# Patient Record
Sex: Female | Born: 1985 | Race: Black or African American | Hispanic: No | Smoking: Former smoker
Health system: Southern US, Community
[De-identification: ages and names within clinical notes are randomized; demographics above are authoritative.]

## PROBLEM LIST (undated history)

## (undated) ENCOUNTER — Inpatient Hospital Stay (HOSPITAL_COMMUNITY): Payer: Self-pay

## (undated) DIAGNOSIS — N926 Irregular menstruation, unspecified: Secondary | ICD-10-CM

## (undated) DIAGNOSIS — Z8619 Personal history of other infectious and parasitic diseases: Secondary | ICD-10-CM

## (undated) DIAGNOSIS — A5901 Trichomonal vulvovaginitis: Secondary | ICD-10-CM

## (undated) DIAGNOSIS — S61011A Laceration without foreign body of right thumb without damage to nail, initial encounter: Secondary | ICD-10-CM

## (undated) DIAGNOSIS — A549 Gonococcal infection, unspecified: Secondary | ICD-10-CM

## (undated) DIAGNOSIS — K219 Gastro-esophageal reflux disease without esophagitis: Secondary | ICD-10-CM

## (undated) DIAGNOSIS — D649 Anemia, unspecified: Secondary | ICD-10-CM

## (undated) HISTORY — PX: NO PAST SURGERIES: SHX2092

## (undated) HISTORY — PX: WISDOM TOOTH EXTRACTION: SHX21

## (undated) HISTORY — DX: Personal history of other infectious and parasitic diseases: Z86.19

## (undated) HISTORY — DX: Anemia, unspecified: D64.9

---
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
## 2007-11-05 ENCOUNTER — Emergency Department (HOSPITAL_COMMUNITY): Admission: EM | Admit: 2007-11-05 | Discharge: 2007-11-05 | Payer: Self-pay | Admitting: Family Medicine

## 2008-01-26 ENCOUNTER — Emergency Department (HOSPITAL_COMMUNITY): Admission: EM | Admit: 2008-01-26 | Discharge: 2008-01-26 | Payer: Self-pay | Admitting: Family Medicine

## 2008-04-03 ENCOUNTER — Emergency Department (HOSPITAL_COMMUNITY): Admission: EM | Admit: 2008-04-03 | Discharge: 2008-04-03 | Payer: Self-pay | Admitting: Emergency Medicine

## 2008-04-20 ENCOUNTER — Emergency Department (HOSPITAL_COMMUNITY): Admission: EM | Admit: 2008-04-20 | Discharge: 2008-04-20 | Payer: Self-pay | Admitting: Family Medicine

## 2008-10-22 ENCOUNTER — Emergency Department (HOSPITAL_COMMUNITY): Admission: EM | Admit: 2008-10-22 | Discharge: 2008-10-22 | Payer: Self-pay | Admitting: Emergency Medicine

## 2009-07-25 ENCOUNTER — Emergency Department (HOSPITAL_COMMUNITY): Admission: EM | Admit: 2009-07-25 | Discharge: 2009-07-26 | Payer: Self-pay | Admitting: Emergency Medicine

## 2009-08-01 ENCOUNTER — Emergency Department (HOSPITAL_COMMUNITY): Admission: EM | Admit: 2009-08-01 | Discharge: 2009-08-01 | Payer: Self-pay | Admitting: Emergency Medicine

## 2009-09-17 ENCOUNTER — Ambulatory Visit: Payer: Self-pay | Admitting: Physician Assistant

## 2009-09-17 DIAGNOSIS — R079 Chest pain, unspecified: Secondary | ICD-10-CM | POA: Insufficient documentation

## 2009-09-17 DIAGNOSIS — J309 Allergic rhinitis, unspecified: Secondary | ICD-10-CM | POA: Insufficient documentation

## 2009-09-17 DIAGNOSIS — K219 Gastro-esophageal reflux disease without esophagitis: Secondary | ICD-10-CM | POA: Insufficient documentation

## 2009-09-18 ENCOUNTER — Encounter: Payer: Self-pay | Admitting: Physician Assistant

## 2009-11-05 ENCOUNTER — Ambulatory Visit: Payer: Self-pay | Admitting: Nurse Practitioner

## 2009-11-05 DIAGNOSIS — F17200 Nicotine dependence, unspecified, uncomplicated: Secondary | ICD-10-CM | POA: Insufficient documentation

## 2009-11-05 DIAGNOSIS — S93409A Sprain of unspecified ligament of unspecified ankle, initial encounter: Secondary | ICD-10-CM | POA: Insufficient documentation

## 2009-11-05 DIAGNOSIS — M25519 Pain in unspecified shoulder: Secondary | ICD-10-CM | POA: Insufficient documentation

## 2009-12-18 ENCOUNTER — Ambulatory Visit: Payer: Self-pay | Admitting: Nurse Practitioner

## 2009-12-19 ENCOUNTER — Ambulatory Visit: Payer: Self-pay | Admitting: Nurse Practitioner

## 2010-02-09 LAB — CONVERTED CEMR LAB
ALT: 8 units/L (ref 0–35)
Alkaline Phosphatase: 49 units/L (ref 39–117)
Basophils Relative: 0 % (ref 0–1)
Calcium: 9 mg/dL (ref 8.4–10.5)
Eosinophils Relative: 7 % — ABNORMAL HIGH (ref 0–5)
Glucose, Bld: 80 mg/dL (ref 70–99)
Lymphocytes Relative: 37 % (ref 12–46)
Lymphs Abs: 3.5 10*3/uL (ref 0.7–4.0)
MCV: 88.6 fL (ref 78.0–100.0)
Monocytes Absolute: 0.5 10*3/uL (ref 0.1–1.0)
Monocytes Relative: 5 % (ref 3–12)
Neutrophils Relative %: 50 % (ref 43–77)
Potassium: 4.3 meq/L (ref 3.5–5.3)
RDW: 14.7 % (ref 11.5–15.5)
TSH: 0.981 microintl units/mL (ref 0.350–4.500)
Total Bilirubin: 0.3 mg/dL (ref 0.3–1.2)

## 2010-02-11 NOTE — Assessment & Plan Note (Signed)
Summary: chest pain/ feeling weak//gk   Vital Signs:  Patient profile:   25 year old female Height:      61 inches Weight:      171 pounds BMI:     32.43 Temp:     98.7 degrees F oral Pulse rate:   78 / minute Pulse rhythm:   regular Resp:     18 per minute BP sitting:   120 / 82  (left arm) Cuff size:   regular  Vitals Entered By: [place] Shannon (September 17, 2009 12:20 PM) CC: [name] [name] is here for chest pains.... [name] [name] would like to know if she can have a med for allergies... Is Patient Diabetic? No Pain Assessment Patient in pain? no       Does patient need assistance? Functional Status Self care Ambulation Normal   Primary Care Provider:  Tereso Newcomer, PA-C  CC:  [name] [name] is here for chest pains.... [name] [name] would like to know if she can have a med for allergies....  History of Present Illness: Re-estab.  Used to see Dr. Barbaraann Barthel. Has had chest pain for about 1 month.  Notes with meals after eating.  Not taking anything.  No exertional chest pain or dyspnea.  No cough or wheezing.  No syncope.  CHest hurst to lay down.  ? h/o PND.  Denies dysphagia.  No melena, hematochezia or hematemesis.  NO dysphagia.  Has problem with allergies.  Notes a lot of nasal congestion, sneezing watery eyes.  Currently not taking anything.  Habits & Providers  Alcohol-Tobacco-Diet     Tobacco Status: current  Exercise-Depression-Behavior     Drug Use: no  Allergies (verified): 1)  ! Sulfa  Past History:  Past Medical History: Unremarkable  Past Surgical History: Denies surgical history  Family History: MGF - CAD (MI in 4s) HTN- Mom Family History Diabetes 1st degree relative - mom  Social History: Current Smoker   a.  1 pk a week Alcohol use-no Drug use-no Single Smoking Status:  current Drug Use:  no  Review of Systems      See HPI GU:  Denies dysuria and hematuria.  Physical Exam  General:  alert, well-developed, and well-nourished.   Head:  normocephalic and atraumatic.     Eyes:  pupils equal, pupils round, and pupils reactive to light.   Ears:  R ear normal and L ear normal.   Nose:  no external deformity.   Mouth:  pharynx pink and moist.   Neck:  supple and no thyromegaly.   Lungs:  normal breath sounds, no crackles, and no wheezes.   Heart:  normal rate and regular rhythm.   Abdomen:  soft, normal bowel sounds, no hepatomegaly, and epigastric tenderness.   Neurologic:  alert & oriented X3 and cranial nerves II-XII intact.   Psych:  normally interactive.     Impression & Recommendations:  Problem # 1:  CHEST PAIN UNSPECIFIED (ICD-786.50) prob from GERD reassurance EKG normal  Orders: EKG w/ Interpretation (93000) T-CBC w/Diff (16109-60454) T-Comprehensive Metabolic Panel (09811-91478) T-TSH (29562-13086)  Problem # 2:  GERD (ICD-530.81)  start zantac check labs f/u in 6 weeks  Her updated medication list for this problem includes:    Ranitidine Hcl 150 Mg Tabs (Ranitidine hcl) .Marland Kitchen... Take 1 tablet by mouth two times a day  Orders: T-CBC w/Diff (57846-96295)  Problem # 3:  ALLERGIC RHINITIS (ICD-477.9)  try flonase and zyrtec  Her updated medication list for this problem includes:    Flonase 50 Mcg/act  Susp (Fluticasone propionate) .Marland Kitchen... 2 sprays each nostril once daily    Zyrtec Allergy 10 Mg Tabs (Cetirizine hcl) .Marland Kitchen... Take 1 tablet by mouth once a day  Complete Medication List: 1)  Ranitidine Hcl 150 Mg Tabs (Ranitidine hcl) .... Take 1 tablet by mouth two times a day 2)  Flonase 50 Mcg/act Susp (Fluticasone propionate) .... 2 sprays each nostril once daily 3)  Zyrtec Allergy 10 Mg Tabs (Cetirizine hcl) .... Take 1 tablet by mouth once a day  Patient Instructions: 1)  Use Afrin two times a day for 3 days then stop. 2)  Start using FLonase when you get it.  Do not use at same time as Afrin. 3)  Take zyrtec once daily. 4)  Take zantac two times a day every day. 5)  Schedule follow up with Ronon Ferger in 6 weeks. 6)  Avoid foods  high in acid(tomatoes, citrus juices,spicy foods).Avoid eating within two hours of lying down or before exercising. Do not over eat: try smaller more frequent meals. Elevate head of bed twelve inches when sleeping.  7)  Tobacco is very bad for your health and your loved ones ! You should stop smoking !  8)  Stop smoking tips: Choose a quit date. Cut down before the quit date. Decide what you will do as a substitute when you feel the urge to smoke(gum, toothpick, exercise).  9)  Smoking can make stomach acid worse. Prescriptions: ZYRTEC ALLERGY 10 MG TABS (CETIRIZINE HCL) Take 1 tablet by mouth once a day  #30 x 3   Entered and Authorized by:   Tereso Newcomer PA-C   Signed by:   Tereso Newcomer PA-C on 09/17/2009   Method used:   Print then Give to Patient   RxID:   [id number] FLONASE 50 MCG/ACT SUSP (FLUTICASONE PROPIONATE) 2 sprays each nostril once daily  #1 x 3   Entered and Authorized by:   Tereso Newcomer PA-C   Signed by:   Tereso Newcomer PA-C on 09/17/2009   Method used:   Print then Give to Patient   RxID:   [id number] RANITIDINE HCL 150 MG TABS (RANITIDINE HCL) Take 1 tablet by mouth two times a day  #60 x 3   Entered and Authorized by:   Tereso Newcomer PA-C   Signed by:   Tereso Newcomer PA-C on 09/17/2009   Method used:   Print then Give to Patient   RxID:   [id number]    EKG  Procedure date:  09/17/2009  Findings:      Normal sinus rhythm with rate of:  73 normal axis no isch changes

## 2010-02-11 NOTE — Letter (Signed)
Summary: Handout Printed  Printed Handout:  - Ankle Sprain 
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
## 2010-02-11 NOTE — Letter (Signed)
Summary: *HS[facility] Results Follow up  [facility]  [address] [place], [place] [address]   Phone: [phone]  Fax: [phone]      09/18/2009   [name] [name] [address] [place], [place]  [address]   Dear  [name],                            ____S.Drinkard,FNP   ____D. Gore,FNP       ____B. McPherson,MD   ____V. Rankins,MD    ____E. Mulberry,MD    ____N. Daphine Deutscher, FNP  ____D. Reche Dixon, MD    ____K. Philipp Deputy, MD    __x__S. Alben Spittle, PA-C     This letter is to inform you that your recent test(s):  _______Pap Smear    ___x____Lab Test     _______X-ray    ___x____ is within acceptable limits  _______ requires a medication change  _______ requires a follow-up lab visit  _______ requires a follow-up visit with your provider   Comments: Blood counts, thyroid, kidney and liver function all normal.       _________________________________________________________ If you have any questions, please contact our office                     Sincerely,  [name] Newcomer PA-C [facility]

## 2010-02-14 ENCOUNTER — Other Ambulatory Visit (HOSPITAL_COMMUNITY)
Admission: RE | Admit: 2010-02-14 | Discharge: 2010-02-14 | Disposition: A | Discharge status: Home / Self Care | Payer: Self-pay | Source: Ambulatory Visit | Attending: Nurse Practitioner | Admitting: Nurse Practitioner

## 2010-02-14 ENCOUNTER — Other Ambulatory Visit: Payer: Self-pay | Admitting: Nurse Practitioner

## 2010-02-14 ENCOUNTER — Encounter: Payer: Self-pay | Admitting: Nurse Practitioner

## 2010-02-14 ENCOUNTER — Encounter (INDEPENDENT_AMBULATORY_CARE_PROVIDER_SITE_OTHER): Payer: Self-pay | Admitting: Nurse Practitioner

## 2010-02-14 DIAGNOSIS — Z01419 Encounter for gynecological examination (general) (routine) without abnormal findings: Secondary | ICD-10-CM | POA: Insufficient documentation

## 2010-02-14 LAB — CONVERTED CEMR LAB
Bilirubin Urine: NEGATIVE
Glucose, Urine, Semiquant: NEGATIVE
Ketones, urine, test strip: NEGATIVE
Nitrite: NEGATIVE
Urobilinogen, UA: 0.2

## 2010-02-17 ENCOUNTER — Encounter (INDEPENDENT_AMBULATORY_CARE_PROVIDER_SITE_OTHER): Payer: Self-pay | Admitting: Nurse Practitioner

## 2010-02-17 DIAGNOSIS — A5400 Gonococcal infection of lower genitourinary tract, unspecified: Secondary | ICD-10-CM | POA: Insufficient documentation

## 2010-02-17 LAB — CONVERTED CEMR LAB
Basophils Absolute: 0.1 10*3/uL (ref 0.0–0.1)
Chlamydia, DNA Probe: NEGATIVE
GC Probe Amp, Genital: POSITIVE — AB
HCT: 40.6 % (ref 36.0–46.0)
Hemoglobin: 13.2 g/dL (ref 12.0–15.0)
Lymphocytes Relative: 45 % (ref 12–46)
Lymphs Abs: 3.8 10*3/uL (ref 0.7–4.0)
MCV: 89.8 fL (ref 78.0–100.0)
Monocytes Relative: 6 % (ref 3–12)
Neutro Abs: 3.7 10*3/uL (ref 1.7–7.7)
Neutrophils Relative %: 44 % (ref 43–77)
Platelets: 176 10*3/uL (ref 150–400)
RDW: 14.6 % (ref 11.5–15.5)

## 2010-02-18 ENCOUNTER — Telehealth (INDEPENDENT_AMBULATORY_CARE_PROVIDER_SITE_OTHER): Payer: Self-pay | Admitting: Nurse Practitioner

## 2010-02-18 ENCOUNTER — Encounter (INDEPENDENT_AMBULATORY_CARE_PROVIDER_SITE_OTHER): Payer: Self-pay | Admitting: Nurse Practitioner

## 2010-02-18 DIAGNOSIS — A599 Trichomoniasis, unspecified: Secondary | ICD-10-CM | POA: Insufficient documentation

## 2010-02-19 ENCOUNTER — Encounter (INDEPENDENT_AMBULATORY_CARE_PROVIDER_SITE_OTHER): Payer: Self-pay | Admitting: Nurse Practitioner

## 2010-02-19 NOTE — Assessment & Plan Note (Signed)
Summary: Complete Physical Exam   Vital Signs:  Patient profile:   25 year old female Menstrual status:  regular LMP:     02/02/2010 Weight:      163.6 pounds BMI:     31.02 Temp:     97.4 degrees F oral Pulse rate:   76 / minute Pulse rhythm:   regular Resp:     20 per minute BP sitting:   100 / 70  (left arm) Cuff size:   regular  Vitals Entered By: Levon Hedger (February 14, 2010 9:51 AM)  Nutrition Counseling: Patient's BMI is greater than 25 and therefore counseled on weight management options. CC: CPP Is Patient Diabetic? No Pain Assessment Patient in pain? no       Does patient need assistance? Functional Status Self care Ambulation Normal LMP (date): 02/02/2010 LMP - Character: light Menarche (age onset years): 15   Menses interval (days): 28 Menstrual flow (days): 5 Menstrual Status regular Enter LMP: 02/02/2010  Immunization History:  Hepatitis B Immunization History:    Hepatitis B # 1:  historical (11/27/2000)    Hepatitis B # 2:  historical (12/27/2000)    Hepatitis B # 3:  historical (04/18/2001)  DPT Immunization History:    DPT # 1:  historical (02/25/1988)    DPT # 2:  historical (05/27/1988)    DPT # 3:  historical (09/17/1988)    DPT # 4:  historical (09/15/1989)    DPT # 5:  historical (02/26/1992)  HIB Immunization History:    HIB # 1:  historical (01/29/1989)  Polio Immunization History:    Polio # 1:  historical (02/25/1988)    Polio # 2:  historical (05/27/1988)    Polio # 3:  historical (01/29/1989)    Polio # 4:  historical (02/26/1992)  MMR Immunization History:    MMR # 1:  historical (01/29/1989)    MMR # 2:  historical (02/26/1992)  Tetanus/Td Immunization History:    Tetanus/Td:  historical (07/05/2006)  Meningococcal Immunization History:    Meningococcal:  menactra(state) (07/05/2006)  Hepatitis A Immunization History:    Hepatitis A # 1:  historical (07/05/2006)  HPV History:    HPV # 1:  gardasil (state)  (07/05/2006)   Primary Care Provider:  Tereso Newcomer, PA-C  CC:  CPP.  History of Present Illness:  Pt into the office for a complete physical exam.  PAP - last done at [facility] in 2008. all normal results no children, never been pregnant Menses monthly and LMP 02/02/2010 Pt has a boyfriend with whom she lives.  Mammogram - never had mammogram. no self breast exams home. No family history of breast cancer  Optho - no current glasses or contacts.  Last eye exam was about 4 years ago and admits that she needs another screening exam  Dental - last dental exam was 09/2009. Cleaning done.  Family planning - she has been on birth control since age 32 Pt wants to restart DepoProvera. She has been on it before but was not able to get refills and f/u with her provider at that time so she did not get it don.  Habits & Providers  Alcohol-Tobacco-Diet     Alcohol drinks/day: 0     Tobacco Status: current     Tobacco Counseling: to quit use of tobacco products     Cigarette Packs/Day: 0.25  Exercise-Depression-Behavior     Does Patient Exercise: no     Drug Use: no  Comments: PHQ- 9 score =  13, spoke with pt and she feels responsible for the burdens of some of her family members, along with what goes on at school and her work functions.  Admits that she does feel overwhelmed at times  Allergies (verified): 1)  ! Sulfa  Social History: Does Patient Exercise:  no  Review of Systems General:  Complains of sleep disorder; denies fever; problems staying asleep at times.  wakes up several times during the night. Eyes:  Denies blurring. ENT:  Denies nasal congestion. CV:  Denies chest pain or discomfort. Resp:  Denies cough. GI:  Complains of indigestion; denies abdominal pain, nausea, and vomiting; admits that she has been eatling lots of spicy foods lately.. GU:  Denies discharge. MS:  Denies joint pain. Neuro:  Denies headaches. Psych:  Denies depression. Endo:  Denies excessive  thirst and excessive urination. Allergy:  Complains of seasonal allergies.  Physical Exam  General:  alert.   Head:  normocephalic.   Eyes:  pupils round.   Ears:  ear piercing(s) noted.  bil TM with bony landmarks present Nose:  no nasal discharge.   Mouth:  pharynx pink and moist and fair dentition.   Neck:  supple.   Chest Wall:  no mass.   Breasts:  no abnormal thickening.   Lungs:  normal breath sounds.   Heart:  normal rate and regular rhythm.   Abdomen:  normal bowel sounds.   Rectal:  defer Msk:  normal ROM.   Pulses:  R radial normal and L radial normal.   Extremities:  no edema Neurologic:  alert & oriented X3 and gait normal.   Skin:  color normal.   Psych:  Oriented X3.    Pelvic Exam  Vulva:      normal appearance.   Urethra and Bladder:      Urethra--normal.  Bladder--normal.   Vagina:      normal.   Cervix:      midposition.   Uterus:      smooth.   Adnexa:      nontender bilaterally.      Impression & Recommendations:  Problem # 1:  PREVENTIVE HEALTH CARE (ICD-V70.0) PHQ- 13 rec optho and dental exam Orders: UA Dipstick w/o Micro (manual) (91478) T-CBC w/Diff [phone]) T- GC Chlamydia (57846) KOH/ WET Mount [phone]) Pap Smear, Thin Prep ( Collection of) (M8413)  Problem # 2:  TOBACCO ABUSE (ICD-305.1) advised cessation  Problem # 3:  GERD (ICD-530.81) advised pt that she need to modify her diet which is causing symptoms. Her updated medication list for this problem includes:    Ranitidine Hcl 150 Mg Tabs (Ranitidine hcl) .Marland Kitchen... Take 1 tablet by mouth two times a day  Problem # 4:  NEED PROPHYLACTIC VACCINATION&INOCULATION FLU (ICD-V04.81) given today in office  Complete Medication List: 1)  Ranitidine Hcl 150 Mg Tabs (Ranitidine hcl) .... Take 1 tablet by mouth two times a day 2)  Flonase 50 Mcg/act Susp (Fluticasone propionate) .... 2 sprays each nostril once daily 3)  Zyrtec Allergy 10 Mg Tabs (Cetirizine hcl) .... Take 1 tablet  by mouth once a day 4)  Ibuprofen 800 Mg Tabs (Ibuprofen) .... One tablet by mouth two times a day for for inflammation  Other Orders: Flu Vaccine 53yrs + (24401) Admin 1st Vaccine (02725)  Patient Instructions: 1)  You will be informed of abnormal lab results. 2)  You have been given the flu vaccine today 3)  Keep up your efforts to quit smoking. 4)  YOU NEED 2 LAB  APPOINTMENTS ON NEXT WEEK. 5)  Schedule an appointment on next week for a lab visit - serum HCG.  You will need to get your prescription for depoprovera on that day. 6)  Schedule a nurse visit the VERY next day for depoprovera.  You must bring the medication into the office with you.   Orders Added: 1)  Est. Patient age 60-39 [69] 2)  UA Dipstick w/o Micro (manual) [81002] 3)  T-CBC w/Diff [16109-60454] 4)  T- GC Chlamydia [09811] 5)  KOH/ WET Mount [87210] 6)  Pap Smear, Thin Prep ( Collection of) [Q0091] 7)  Flu Vaccine 35yrs + [90658] 8)  Admin 1st Vaccine [90471]   Immunization History:  Hepatitis B Immunization History:    Hepatitis B # 1:  historical (11/27/2000)    Hepatitis B # 2:  historical (12/27/2000)    Hepatitis B # 3:  historical (04/18/2001)  DPT Immunization History:    DPT # 1:  historical (02/25/1988)    DPT # 2:  historical (05/27/1988)    DPT # 3:  historical (09/17/1988)    DPT # 4:  historical (09/15/1989)    DPT # 5:  historical (02/26/1992)  HIB Immunization History:    HIB # 1:  historical (01/29/1989)  Polio Immunization History:    Polio # 1:  historical (02/25/1988)    Polio # 2:  historical (05/27/1988)    Polio # 3:  historical (01/29/1989)    Polio # 4:  historical (02/26/1992)  MMR Immunization History:    MMR # 1:  historical (01/29/1989)    MMR # 2:  historical (02/26/1992)  Tetanus/Td Immunization History:    Tetanus/Td:  historical (07/05/2006)  Meningococcal Immunization History:    Meningococcal:  menactra(state) (07/05/2006)  Hepatitis A Immunization  History:    Hepatitis A # 1:  historical (07/05/2006)  HPV History:    HPV # 1:  gardasil (state) (07/05/2006)  Immunizations Administered:  Influenza Vaccine # 1:    Vaccine Type: Fluvax 3+    Site: right deltoid    Mfr: GlaxoSmithKline    Dose: 0.5 ml    Route: IM    Given by: Levon Hedger    Exp. Date: 07/12/2010    Lot #: [id number]    VIS given: 08/06/09 version given February 14, 2010.  Flu Vaccine Consent Questions:    Do you have a history of severe allergic reactions to this vaccine? no    Any prior history of allergic reactions to egg and/or gelatin? no    Do you have a sensitivity to the preservative Thimersol? no    Do you have a past history of Guillan-Barre Syndrome? no    Do you currently have an acute febrile illness? no    Have you ever had a severe reaction to latex? no    Vaccine information given and explained to patient? yes    Are you currently pregnant? no    ndc  [phone]  Immunization History:  Hepatitis B Immunization History:    Hepatitis B # 1:  Historical (11/27/2000)    Hepatitis B # 2:  Historical (12/27/2000)    Hepatitis B # 3:  Historical (04/18/2001)  DPT History:    DPT # 1:  Historical (02/25/1988)    DPT # 2:  Historical (05/27/1988)    DPT # 3:  Historical (09/17/1988)    DPT # 4:  Historical (09/15/1989)    DPT # 5:  Historical (02/26/1992)  HIB History:    HIB #  1:  Historical (01/29/1989)  Polio Immunization History:    Polio # 1:  Historical (02/25/1988)    Polio # 2:  Historical (05/27/1988)    Polio # 3:  Historical (01/29/1989)    Polio # 4:  Historical (02/26/1992)  MMR Immunization History:    MMR # 1:  Historical (01/29/1989)    MMR # 2:  Historical (02/26/1992)  Tetanus/Td Immunization History:    Tetanus/Td:  Historical (07/05/2006)  Meningococcal Immunization History:    Meningococcal:  Menactra(State) (07/05/2006)  Hepatitis A Immunization History:    Hepatitis A # 1:  Historical  (07/05/2006)  HPV History:    HPV # 1:  Gardasil (State) (07/05/2006)  Immunizations Administered:  Influenza Vaccine # 1:    Vaccine Type: Fluvax 3+    Site: right deltoid    Mfr: GlaxoSmithKline    Dose: 0.5 ml    Route: IM    Given by: Levon Hedger    Exp. Date: 07/12/2010    Lot #: [id number]    VIS given: 08/06/09 version given February 14, 2010.  Laboratory Results   Urine Tests  Date/Time Received: February 14, 2010 10:12 AM   Routine Urinalysis   Glucose: negative   (Normal Range: Negative) Bilirubin: negative   (Normal Range: Negative) Ketone: negative   (Normal Range: Negative) Spec. Gravity: >=1.030   (Normal Range: 1.003-1.035) Blood: negative   (Normal Range: Negative) pH: 5.5   (Normal Range: 5.0-8.0) Protein: negative   (Normal Range: Negative) Urobilinogen: 0.2   (Normal Range: 0-1) Nitrite: negative   (Normal Range: Negative) Leukocyte Esterace: negative   (Normal Range: Negative)    Date/Time Received: February 14, 2010   Wet Mount/KOH Source: vaginal WBC/hpf: 1-5 Bacteria/hpf: rare Clue cells/hpf: none Yeast/hpf: none Trichomonas/hpf: none    Prevention & Chronic Care Immunizations   Influenza vaccine: Fluvax 3+  (02/14/2010)   Influenza vaccine deferral: Refused  (11/05/2009)    Tetanus booster: 07/05/2006: Historical    Pneumococcal vaccine: Not documented  Other Screening   Pap smear: Not documented   Smoking status: current  (02/14/2010)   Smoking cessation counseling: YES  (09/17/2009)   Nursing Instructions: Give Flu vaccine today    Laboratory Results   Urine Tests    Routine Urinalysis   Glucose: negative   (Normal Range: Negative) Bilirubin: negative   (Normal Range: Negative) Ketone: negative   (Normal Range: Negative) Spec. Gravity: >=1.030   (Normal Range: 1.003-1.035) Blood: negative   (Normal Range: Negative) pH: 5.5   (Normal Range: 5.0-8.0) Protein: negative   (Normal Range:  Negative) Urobilinogen: 0.2   (Normal Range: 0-1) Nitrite: negative   (Normal Range: Negative) Leukocyte Esterace: negative   (Normal Range: Negative)      Wet Mount Wet Mount KOH: Negative

## 2010-02-19 NOTE — Progress Notes (Signed)
Summary: Office Visit//DEPRESSION SCREENING  Office Visit//DEPRESSION SCREENING   Imported By: Arta Bruce 02/14/2010 15:02:53  _____________________________________________________________________  External Attachment:    Type:   Image     Comment:   External Document

## 2010-02-27 NOTE — Assessment & Plan Note (Signed)
Summary: Serum HCG  Nurse Visit   Allergies: 1)  ! Sulfa  Orders Added: 1)  Est. Patient Level I [16109] 2)  T-Pregnancy (Serum), Qual.  [phone] Prescriptions: DEPO-PROVERA 150 MG/ML SUSP (MEDROXYPROGESTERONE ACETATE) One injection every 12 weeks for birth control  #1 x 3   Entered by:   Levon Hedger   Authorized by:   Lehman Prom FNP   Signed by:   Levon Hedger on 02/17/2010   Method used:   Electronically to        [facility] [phone]* (retail)       [address]       [place], [place]  [address]       Ph: [phone]       Fax: [phone]   RxID:   [id number]   Appended Document: Lab Order    Lab Visit  Orders Today: Admin of Therapeutic Inj  intramuscular or subcutaneous [03474] Rocephin  250mg  [J0696]      Medication Administration  Injection # 1:    Medication: Rocephin  250mg     Diagnosis: GONORRHEA (ICD-098.0)    Route: IM    Site: LUOQ gluteus    Exp Date: 03/2011    Lot #: [id number]    Mfr: Sandoz    Comments: OVF  [id number]    Patient tolerated injection without complications    Given by: Levon Hedger (February 17, 2010 4:32 PM)  Orders Added: 1)  Admin of Therapeutic Inj  intramuscular or subcutaneous [96372] 2)  Rocephin  250mg  [C1660]

## 2010-02-27 NOTE — Progress Notes (Signed)
Summary: Needs further treatment  Phone Note Other Incoming   Summary of Call: From Brandy at [facility] -- treatment for gonorrhea has changed.  Treatment includes Rocephin 250 mg. IM plus azithromycin 1 gm orally x1 dose.  If we don't have the azithromycin, we can use doxycycline 100 mg. by mouth two times a day x 7 days.  Pt. needs to have further treatment.  Please call [facility] when additional treatment has been completed - [phone]. Initial call taken by: Dutch Quint RN,  February 18, 2010 2:26 PM  Follow-up for Phone Call        notify pt of above. She has received the injection but she will also need the oral meds  Once you contact the pt and send Rx to the pharmacy then call [facility] and given them an update Pt also has trichomonas.  She will need to take metronidazole 500gm - 4 pills x 1 dose. She should take the azithromycin one day then the next day take the metronidazole.  she has two different STD's please stress with pt the importance of condom use Follow-up by: Lehman Prom FNP,  February 19, 2010 8:56 AM  Additional Follow-up for Phone Call Additional follow up Details #1::        Levon Hedger  February 19, 2010 1:11 PM Left message on machine for pt to return call to the office.  pt informed of above information.  Rx faxed to [facility] [address] [facility]ienenstock is aware of above information as well. 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       [facility] [address]  [phone]* (retail)       [address]       [place], [place]  [address]       Ph: [phone]       Fax: [phone]   RxID:   [id number] AZITHROMYCIN 500 MG TABS (AZITHROMYCIN) 2 tablets by mouth x 1 dose  #2 x 0   Entered and Authorized by:   Lehman Prom FNP   Signed by:   Lehman Prom FNP on 02/19/2010   Method used:   Printed then faxed to ...       [facility] [address] [phone]* (retail)       [address]       [place], [place]  [address]       Ph: [phone]       Fax: [phone]   RxID:   [id number]

## 2010-02-27 NOTE — Letter (Signed)
Summary: COMMUNICABLE DISEASE REPORT  COMMUNICABLE DISEASE REPORT   Imported By: Arta Bruce 02/17/2010 16:33:09  _____________________________________________________________________  External Attachment:    Type:   Image     Comment:   External Document

## 2010-02-27 NOTE — Assessment & Plan Note (Signed)
Summary: Depo  Nurse Visit   Vitals Entered By: Hale Drone CMA (February 18, 2010 9:19 AM)  Allergies: 1)  ! Sulfa  Medication Administration  Injection # 1:    Medication: Depo-Provera 150mg     Diagnosis: CONTRACEPTIVE MANAGEMENT (ICD-V25.09)    Route: IM    Site: RUOQ gluteus    Exp Date: 12/11/2012    Lot #: [id number]    Mfr: Francisca December    Comments: NDC: [address]-5    Patient tolerated injection without complications    Given by: Hale Drone CMA (February 18, 2010 9:36 AM)  Orders Added: 1)  Est. Patient Nurse visit [09003] 2)  Admin of Therapeutic Inj  intramuscular or subcutaneous [96372] 3)  Depo-Provera 150mg  [J1055]

## 2010-03-11 ENCOUNTER — Inpatient Hospital Stay (INDEPENDENT_AMBULATORY_CARE_PROVIDER_SITE_OTHER)
Admission: RE | Admit: 2010-03-11 | Discharge: 2010-03-11 | Disposition: A | Discharge status: Home / Self Care | Payer: Self-pay | Source: Ambulatory Visit | Attending: Emergency Medicine | Admitting: Emergency Medicine

## 2010-03-11 DIAGNOSIS — B373 Candidiasis of vulva and vagina: Secondary | ICD-10-CM

## 2010-03-11 DIAGNOSIS — B3731 Acute candidiasis of vulva and vagina: Secondary | ICD-10-CM

## 2010-03-11 LAB — WET PREP, GENITAL

## 2010-03-13 LAB — GC/CHLAMYDIA PROBE AMP, GENITAL
Chlamydia, DNA Probe: NEGATIVE
GC Probe Amp, Genital: NEGATIVE

## 2010-03-26 ENCOUNTER — Telehealth (INDEPENDENT_AMBULATORY_CARE_PROVIDER_SITE_OTHER): Payer: Self-pay | Admitting: Nurse Practitioner

## 2010-03-29 LAB — POCT URINALYSIS DIP (DEVICE)
Glucose, UA: NEGATIVE mg/dL
Hgb urine dipstick: NEGATIVE
Ketones, ur: NEGATIVE mg/dL
pH: 5.5 (ref 5.0–8.0)

## 2010-03-29 LAB — WET PREP, GENITAL
Clue Cells Wet Prep HPF POC: NONE SEEN
Trich, Wet Prep: NONE SEEN
WBC, Wet Prep HPF POC: NONE SEEN
Yeast Wet Prep HPF POC: NONE SEEN

## 2010-03-29 LAB — GC/CHLAMYDIA PROBE AMP, GENITAL
Chlamydia, DNA Probe: NEGATIVE
Chlamydia, DNA Probe: NEGATIVE
GC Probe Amp, Genital: NEGATIVE
GC Probe Amp, Genital: NEGATIVE

## 2010-03-29 LAB — POCT PREGNANCY, URINE: Preg Test, Ur: NEGATIVE

## 2010-03-29 LAB — URINE CULTURE: Colony Count: NO GROWTH

## 2010-04-01 NOTE — Progress Notes (Signed)
Summary: Vag bleeding  Phone Note Call from Patient   Caller: Patient Summary of Call: Started Depo in Feb. has been having some spotting regularly for the past month. Assured her with Depo may have some spotting concern would be if she is having heavy bleeding or passing lg clots. Advised to keep a record and should problem get worse or continue she should schedule an appt. and bring her record with her. Initial call taken by: Gaylyn Cheers RN,  March 26, 2010 4:14 PM

## 2010-04-17 LAB — URINALYSIS, ROUTINE W REFLEX MICROSCOPIC
Bilirubin Urine: NEGATIVE
Glucose, UA: NEGATIVE mg/dL
Hgb urine dipstick: NEGATIVE
Nitrite: NEGATIVE
Protein, ur: NEGATIVE mg/dL
Urobilinogen, UA: 1 mg/dL (ref 0.0–1.0)
pH: 6.5 (ref 5.0–8.0)

## 2010-04-17 LAB — URINE MICROSCOPIC-ADD ON

## 2010-04-17 LAB — POCT PREGNANCY, URINE: Preg Test, Ur: NEGATIVE

## 2010-04-23 LAB — POCT URINALYSIS DIP (DEVICE)
Bilirubin Urine: NEGATIVE
Nitrite: NEGATIVE
Protein, ur: 30 mg/dL — AB
Specific Gravity, Urine: >=1.03 (ref 1.005–1.030)
pH: 5.5 (ref 5.0–8.0)

## 2010-04-24 LAB — POCT I-STAT, CHEM 8
BUN: 7 mg/dL (ref 6–23)
Calcium, Ion: 1.18 mmol/L (ref 1.12–1.32)
Chloride: 103 mEq/L (ref 96–112)
Creatinine, Ser: 1 mg/dL (ref 0.4–1.2)
Glucose, Bld: 96 mg/dL (ref 70–99)
HCT: 43 % (ref 36.0–46.0)
Sodium: 141 mEq/L (ref 135–145)

## 2010-04-24 LAB — URINALYSIS, ROUTINE W REFLEX MICROSCOPIC
Ketones, ur: 15 mg/dL — AB
Nitrite: NEGATIVE
Protein, ur: 30 mg/dL — AB
Urobilinogen, UA: 0.2 mg/dL (ref 0.0–1.0)
pH: 6.5 (ref 5.0–8.0)

## 2010-04-24 LAB — URINE MICROSCOPIC-ADD ON

## 2010-04-28 LAB — POCT URINALYSIS DIP (DEVICE)
Glucose, UA: NEGATIVE mg/dL
Nitrite: POSITIVE — AB
Specific Gravity, Urine: 1.015 (ref 1.005–1.030)
Urobilinogen, UA: 0.2 mg/dL (ref 0.0–1.0)

## 2010-04-28 LAB — WET PREP, GENITAL: Trich, Wet Prep: NONE SEEN

## 2010-04-28 LAB — POCT PREGNANCY, URINE: Preg Test, Ur: NEGATIVE

## 2010-04-28 LAB — GC/CHLAMYDIA PROBE AMP, GENITAL: GC Probe Amp, Genital: NEGATIVE

## 2011-08-16 ENCOUNTER — Encounter (HOSPITAL_COMMUNITY): Payer: Self-pay | Admitting: Emergency Medicine

## 2011-08-16 ENCOUNTER — Emergency Department (HOSPITAL_COMMUNITY)
Admission: EM | Admit: 2011-08-16 | Discharge: 2011-08-17 | Disposition: A | Discharge status: Home / Self Care | Payer: Self-pay | Attending: Emergency Medicine | Admitting: Emergency Medicine

## 2011-08-16 DIAGNOSIS — S61011A Laceration without foreign body of right thumb without damage to nail, initial encounter: Secondary | ICD-10-CM

## 2011-08-16 DIAGNOSIS — W268XXA Contact with other sharp object(s), not elsewhere classified, initial encounter: Secondary | ICD-10-CM | POA: Insufficient documentation

## 2011-08-16 DIAGNOSIS — S61209A Unspecified open wound of unspecified finger without damage to nail, initial encounter: Secondary | ICD-10-CM | POA: Insufficient documentation

## 2011-08-16 DIAGNOSIS — F17200 Nicotine dependence, unspecified, uncomplicated: Secondary | ICD-10-CM | POA: Insufficient documentation

## 2011-08-16 DIAGNOSIS — Y92009 Unspecified place in unspecified non-institutional (private) residence as the place of occurrence of the external cause: Secondary | ICD-10-CM | POA: Insufficient documentation

## 2011-08-16 HISTORY — DX: Laceration without foreign body of right thumb without damage to nail, initial encounter: S61.011A

## 2011-08-16 NOTE — ED Notes (Signed)
Pt lacerated R thumb base on vase at home, oozing blood at this time

## 2011-08-16 NOTE — ED Notes (Signed)
Area cleaned, bandaged.

## 2011-08-17 MED ORDER — BACITRACIN ZINC 500 UNIT/GM EX OINT
1.0000 "application " | TOPICAL_OINTMENT | Freq: Two times a day (BID) | CUTANEOUS | Status: DC
  Administered 2011-08-17: 1 via TOPICAL

## 2011-08-17 MED ORDER — BACITRACIN ZINC 500 UNIT/GM EX OINT
TOPICAL_OINTMENT | CUTANEOUS | Status: AC
  Filled 2011-08-17: qty 0.9

## 2011-08-17 MED ORDER — NAPROXEN 500 MG PO TABS: 500.0000 mg | ORAL_TABLET | Freq: Two times a day (BID) | ORAL | Status: DC

## 2011-08-17 MED ORDER — LIDOCAINE HCL 1 % IJ SOLN
INTRAMUSCULAR | Status: AC
  Filled 2011-08-17: qty 20

## 2011-08-17 MED ORDER — LIDOCAINE HCL 1 % IJ SOLN
5.0000 mL | Freq: Once | INTRAMUSCULAR | Status: AC
  Administered 2011-08-17: 5 mL via INTRADERMAL

## 2011-08-17 NOTE — ED Provider Notes (Signed)
History     CSN: [id number]  Arrival date & time 08/16/11  2319   First MD Initiated Contact with Patient 08/17/11 0310      Chief Complaint  Patient presents with  . Laceration    (Consider location/radiation/quality/duration/timing/severity/associated sxs/prior treatment) HPI Comments: 26 year old female presents within one hour of cutting her right thumb on a broken vase at home. This was acute in onset, persistent, associated with mild bleeding but no numbness or weakness. The symptoms are moderate, worse with palpation, tetanus is up-to-date  Patient is a 26 y.o. female presenting with skin laceration. The history is provided by the patient and the spouse.  Laceration     History reviewed. No pertinent past medical history.  History reviewed. No pertinent past surgical history.  No family history on file.  History  Substance Use Topics  . Smoking status: Current Everyday Smoker  . Smokeless tobacco: Not on file  . Alcohol Use: Yes     occasional    OB History    Grav Para Term Preterm Abortions TAB SAB Ect Mult Living                  Review of Systems  Constitutional: Negative for fever.  Gastrointestinal: Negative for vomiting.  Skin: Positive for wound.       Laceration  Neurological: Negative for weakness and numbness.    Allergies  Sulfonamide derivatives  Home Medications   Current Outpatient Rx  Name Route Sig Dispense Refill  . NAPROXEN 500 MG PO TABS Oral Take 1 tablet (500 mg total) by mouth 2 (two) times daily with a meal. 30 tablet 0    BP 116/64  Pulse 113  Temp 99.2 F (37.3 C) (Oral)  Resp 18  SpO2 98%  LMP 07/16/2011  Physical Exam  Constitutional: She appears well-developed and well-nourished. No distress.  HENT:  Head: Normocephalic.  Eyes: Conjunctivae are normal. No scleral icterus.  Cardiovascular: Normal rate and regular rhythm.   Pulmonary/Chest: Effort normal and breath sounds normal.  Musculoskeletal: Normal range  of motion. She exhibits tenderness ( ttp over the laceration site of the right thumb on the radial surface). She exhibits no edema.  Neurological: She is alert. Coordination normal.       Sensation and motor intact  Skin: Skin is warm and dry. She is not diaphoretic.       Laceration located on right thumb The Laceration is linear shaped The depth is subcutaneous The length is 3.5 cm    ED Course  Procedures (including critical care time)  Labs Reviewed - No data to display No results found.   1. Laceration of right thumb    Further evaluation of the patient's thumb after anesthetic placed reveals no obvious tendon deficits however the patient does resistant to extend her thumb secondary to mild pain. There is some amount of pressure placed in the extensor position, no flexor injury. Recommend followup with hand. Wound image to the bottom without any signs of foreign body.   MDM   LACERATION REPAIR Performed by: Vida Roller Authorized by: Vida Roller Consent: Verbal consent obtained. Risks and benefits: risks, benefits and alternatives were discussed Consent given by: patient Patient identity confirmed: provided demographic data Prepped and Draped in normal sterile fashion Wound explored  Laceration Location: Right thumb  Laceration Length: 3.5 cm  No Foreign Bodies seen or palpated  Anesthesia: local infiltration  Local anesthetic: lidocaine 1 % without epinephrine  Anesthetic total: 5 ml  Irrigation method:  syringe - 1 L of normal saline irrigation  Amount of cleaning: standard with Betadine   Skin closure: 5-0 Prolene   Number of sutures: 8   Technique: Simple interrupted   Patient tolerance: Patient tolerated the procedure well with no immediate complications.   Discharge Prescriptions include:  Naprosyn         Vida Roller, MD 08/17/11 [phone]

## 2011-08-17 NOTE — ED Notes (Signed)
Pt tripped and fell on top of a table which had a glass vase on it at 2250. Pt right thumb laceration.

## 2011-08-27 ENCOUNTER — Other Ambulatory Visit: Payer: Self-pay | Admitting: Orthopedic Surgery

## 2011-08-27 ENCOUNTER — Encounter (HOSPITAL_BASED_OUTPATIENT_CLINIC_OR_DEPARTMENT_OTHER): Payer: Self-pay | Admitting: *Deleted

## 2011-08-31 ENCOUNTER — Ambulatory Visit (HOSPITAL_BASED_OUTPATIENT_CLINIC_OR_DEPARTMENT_OTHER)
Admission: RE | Admit: 2011-08-31 | Discharge: 2011-08-31 | Disposition: A | Discharge status: Home / Self Care | Payer: Self-pay | Source: Ambulatory Visit | Attending: Orthopedic Surgery | Admitting: Orthopedic Surgery

## 2011-08-31 ENCOUNTER — Encounter (HOSPITAL_BASED_OUTPATIENT_CLINIC_OR_DEPARTMENT_OTHER): Payer: Self-pay | Admitting: Anesthesiology

## 2011-08-31 ENCOUNTER — Encounter (HOSPITAL_BASED_OUTPATIENT_CLINIC_OR_DEPARTMENT_OTHER): Payer: Self-pay | Admitting: Orthopedic Surgery

## 2011-08-31 ENCOUNTER — Encounter (HOSPITAL_BASED_OUTPATIENT_CLINIC_OR_DEPARTMENT_OTHER)
Admission: RE | Disposition: A | Discharge status: Home / Self Care | Payer: Self-pay | Source: Ambulatory Visit | Attending: Orthopedic Surgery

## 2011-08-31 ENCOUNTER — Ambulatory Visit (HOSPITAL_BASED_OUTPATIENT_CLINIC_OR_DEPARTMENT_OTHER): Payer: Self-pay | Admitting: Anesthesiology

## 2011-08-31 DIAGNOSIS — W268XXA Contact with other sharp object(s), not elsewhere classified, initial encounter: Secondary | ICD-10-CM | POA: Insufficient documentation

## 2011-08-31 DIAGNOSIS — K219 Gastro-esophageal reflux disease without esophagitis: Secondary | ICD-10-CM | POA: Insufficient documentation

## 2011-08-31 DIAGNOSIS — IMO0002 Reserved for concepts with insufficient information to code with codable children: Secondary | ICD-10-CM | POA: Insufficient documentation

## 2011-08-31 DIAGNOSIS — F17200 Nicotine dependence, unspecified, uncomplicated: Secondary | ICD-10-CM | POA: Insufficient documentation

## 2011-08-31 DIAGNOSIS — W19XXXA Unspecified fall, initial encounter: Secondary | ICD-10-CM | POA: Insufficient documentation

## 2011-08-31 DIAGNOSIS — S61209A Unspecified open wound of unspecified finger without damage to nail, initial encounter: Secondary | ICD-10-CM | POA: Insufficient documentation

## 2011-08-31 HISTORY — DX: Gastro-esophageal reflux disease without esophagitis: K21.9

## 2011-08-31 HISTORY — DX: Irregular menstruation, unspecified: N92.6

## 2011-08-31 HISTORY — PX: NERVE REPAIR: SHX2083

## 2011-08-31 HISTORY — DX: Laceration without foreign body of right thumb without damage to nail, initial encounter: S61.011A

## 2011-08-31 LAB — POCT HEMOGLOBIN-HEMACUE: Hemoglobin: 14.4 g/dL (ref 12.0–15.0)

## 2011-08-31 SURGERY — REPAIR, NERVE
Anesthesia: General | Site: Thumb | Laterality: Right | Wound class: Clean

## 2011-08-31 MED ORDER — ROPIVACAINE HCL 5 MG/ML IJ SOLN
INTRAMUSCULAR | Status: DC | PRN
  Administered 2011-08-31: 25 mL

## 2011-08-31 MED ORDER — LACTATED RINGERS IV SOLN
INTRAVENOUS | Status: DC
  Administered 2011-08-31 (×2): via INTRAVENOUS

## 2011-08-31 MED ORDER — MIDAZOLAM HCL 2 MG/2ML IJ SOLN
1.0000 mg | INTRAMUSCULAR | Status: DC | PRN
  Administered 2011-08-31: 2 mg via INTRAVENOUS

## 2011-08-31 MED ORDER — FENTANYL CITRATE 0.05 MG/ML IJ SOLN
50.0000 ug | INTRAMUSCULAR | Status: DC | PRN
  Administered 2011-08-31: 100 ug via INTRAVENOUS

## 2011-08-31 MED ORDER — ONDANSETRON HCL 4 MG/2ML IJ SOLN
INTRAMUSCULAR | Status: DC | PRN
  Administered 2011-08-31: 4 mg via INTRAVENOUS

## 2011-08-31 MED ORDER — LIDOCAINE HCL 1 % IJ SOLN
INTRAMUSCULAR | Status: DC | PRN
  Administered 2011-08-31: 2 mL via INTRADERMAL

## 2011-08-31 MED ORDER — LIDOCAINE HCL (CARDIAC) 20 MG/ML IV SOLN
INTRAVENOUS | Status: DC | PRN
  Administered 2011-08-31: 50 mg via INTRAVENOUS

## 2011-08-31 MED ORDER — CEFAZOLIN SODIUM-DEXTROSE 2-3 GM-% IV SOLR
2.0000 g | Freq: Once | INTRAVENOUS | Status: AC
  Administered 2011-08-31: 2 g via INTRAVENOUS

## 2011-08-31 MED ORDER — DEXAMETHASONE SODIUM PHOSPHATE 4 MG/ML IJ SOLN
INTRAMUSCULAR | Status: DC | PRN
  Administered 2011-08-31: 10 mg via INTRAVENOUS

## 2011-08-31 MED ORDER — PROPOFOL 10 MG/ML IV EMUL
INTRAVENOUS | Status: DC | PRN
  Administered 2011-08-31: 200 mg via INTRAVENOUS

## 2011-08-31 MED ORDER — HYDROCODONE-ACETAMINOPHEN 5-325 MG PO TABS: ORAL_TABLET | ORAL | Status: DC

## 2011-08-31 SURGICAL SUPPLY — 60 items
BAG DECANTER FOR FLEXI CONT (MISCELLANEOUS) IMPLANT
BANDAGE ELASTIC 3 VELCRO ST LF (GAUZE/BANDAGES/DRESSINGS) ×2 IMPLANT
BANDAGE GAUZE ELAST BULKY 4 IN (GAUZE/BANDAGES/DRESSINGS) ×2 IMPLANT
BLADE MINI RND TIP GREEN BEAV (BLADE) IMPLANT
BLADE SURG 15 STRL LF DISP TIS (BLADE) ×2 IMPLANT
BLADE SURG 15 STRL SS (BLADE) ×4
BNDG CMPR 9X4 STRL LF SNTH (GAUZE/BANDAGES/DRESSINGS) ×1
BNDG ESMARK 4X9 LF (GAUZE/BANDAGES/DRESSINGS) ×1 IMPLANT
CHLORAPREP W/TINT 26ML (MISCELLANEOUS) ×2 IMPLANT
CLOTH BEACON ORANGE TIMEOUT ST (SAFETY) ×2 IMPLANT
CORDS BIPOLAR (ELECTRODE) ×2 IMPLANT
COVER MAYO STAND STRL (DRAPES) ×2 IMPLANT
COVER TABLE BACK 60X90 (DRAPES) ×2 IMPLANT
CUFF TOURNIQUET SINGLE 18IN (TOURNIQUET CUFF) ×1 IMPLANT
DECANTER SPIKE VIAL GLASS SM (MISCELLANEOUS) ×1 IMPLANT
DRAPE EXTREMITY T 121X128X90 (DRAPE) ×2 IMPLANT
DRAPE SURG 17X23 STRL (DRAPES) ×2 IMPLANT
GAUZE XEROFORM 1X8 LF (GAUZE/BANDAGES/DRESSINGS) ×2 IMPLANT
GLOVE BIO SURGEON STRL SZ 6.5 (GLOVE) ×1 IMPLANT
GLOVE BIO SURGEON STRL SZ7.5 (GLOVE) ×2 IMPLANT
GLOVE BIOGEL PI IND STRL 6.5 (GLOVE) IMPLANT
GLOVE BIOGEL PI IND STRL 8 (GLOVE) ×1 IMPLANT
GLOVE BIOGEL PI INDICATOR 6.5 (GLOVE) ×1
GLOVE BIOGEL PI INDICATOR 8 (GLOVE) ×1
GOWN BRE IMP PREV XXLGXLNG (GOWN DISPOSABLE) ×2 IMPLANT
GOWN PREVENTION PLUS XLARGE (GOWN DISPOSABLE) ×2 IMPLANT
LOOP VESSEL MAXI BLUE (MISCELLANEOUS) IMPLANT
NDL HYPO 25X1 1.5 SAFETY (NEEDLE) IMPLANT
NDL SAFETY ECLIPSE 18X1.5 (NEEDLE) IMPLANT
NEEDLE HYPO 18GX1.5 SHARP (NEEDLE)
NEEDLE HYPO 25X1 1.5 SAFETY (NEEDLE) IMPLANT
NS IRRIG 1000ML POUR BTL (IV SOLUTION) ×2 IMPLANT
PACK BASIN DAY SURGERY FS (CUSTOM PROCEDURE TRAY) ×2 IMPLANT
PAD CAST 3X4 CTTN HI CHSV (CAST SUPPLIES) ×1 IMPLANT
PAD CAST 4YDX4 CTTN HI CHSV (CAST SUPPLIES) IMPLANT
PADDING CAST ABS 4INX4YD NS (CAST SUPPLIES) ×1
PADDING CAST ABS COTTON 4X4 ST (CAST SUPPLIES) ×1 IMPLANT
PADDING CAST COTTON 3X4 STRL (CAST SUPPLIES) ×2
PADDING CAST COTTON 4X4 STRL (CAST SUPPLIES) ×2
SLEEVE SCD COMPRESS KNEE MED (MISCELLANEOUS) ×1 IMPLANT
SPEAR EYE SURG WECK-CEL (MISCELLANEOUS) ×2 IMPLANT
SPLINT PLASTER CAST XFAST 3X15 (CAST SUPPLIES) IMPLANT
SPLINT PLASTER XTRA FASTSET 3X (CAST SUPPLIES) ×12
SPONGE GAUZE 4X4 12PLY (GAUZE/BANDAGES/DRESSINGS) ×2 IMPLANT
STOCKINETTE 4X48 STRL (DRAPES) ×2 IMPLANT
SUT ETHIBOND 3-0 V-5 (SUTURE) IMPLANT
SUT ETHILON 4 0 PS 2 18 (SUTURE) ×2 IMPLANT
SUT FIBERWIRE 4-0 18 TAPR NDL (SUTURE)
SUT MERSILENE 6 0 P 1 (SUTURE) IMPLANT
SUT NYLON 9 0 VRM6 (SUTURE) ×1 IMPLANT
SUT PROLENE 6 0 P 1 18 (SUTURE) IMPLANT
SUT SILK 2 0 FS (SUTURE) ×1 IMPLANT
SUT SILK 4 0 PS 2 (SUTURE) IMPLANT
SUT VICRYL 4-0 PS2 18IN ABS (SUTURE) IMPLANT
SUTURE FIBERWR 4-0 18 TAPR NDL (SUTURE) IMPLANT
SYR BULB 3OZ (MISCELLANEOUS) ×2 IMPLANT
SYR CONTROL 10ML LL (SYRINGE) IMPLANT
TOWEL OR 17X24 6PK STRL BLUE (TOWEL DISPOSABLE) ×4 IMPLANT
UNDERPAD 30X30 INCONTINENT (UNDERPADS AND DIAPERS) ×2 IMPLANT
WATER STERILE IRR 1000ML POUR (IV SOLUTION) ×1 IMPLANT

## 2011-08-31 NOTE — Anesthesia Postprocedure Evaluation (Signed)
Anesthesia Post Note  Patient: [name] [name]  Procedure(s) Performed: Procedure(s) (LRB): NERVE REPAIR (Right)  Anesthesia type: General  Patient location: PACU  Post pain: Pain level controlled  Post assessment: Patient's Cardiovascular Status Stable  Last Vitals:  Filed Vitals:   08/31/11 1259  BP: 110/73  Pulse: 72  Temp: 36.8 C  Resp: 16    Post vital signs: Reviewed and stable  Level of consciousness: alert  Complications: No apparent anesthesia complications

## 2011-08-31 NOTE — Transfer of Care (Signed)
Immediate Anesthesia Transfer of Care Note  Patient: [name] [name]  Procedure(s) Performed: Procedure(s) (LRB): NERVE REPAIR (Right)  Patient Location: PACU  Anesthesia Type: General  Level of Consciousness: awake and alert   Airway & Oxygen Therapy: Patient Spontanous Breathing and Patient connected to face mask oxygen  Post-op Assessment: Report given to PACU RN and Post -op Vital signs reviewed and stable  Post vital signs: Reviewed and stable  Complications: No apparent anesthesia complications

## 2011-08-31 NOTE — Op Note (Signed)
Dictation [id number]

## 2011-08-31 NOTE — Progress Notes (Signed)
Assisted Dr. Frederick with right, ultrasound guided, supraclavicular block. Side rails up, monitors on throughout procedure. See vital signs in flow sheet. Tolerated Procedure well. 

## 2011-08-31 NOTE — Anesthesia Preprocedure Evaluation (Signed)
Anesthesia Evaluation  Patient identified by MRN, date of birth, ID band Patient awake    Reviewed: Allergy & Precautions, H&P , NPO status , Patient's Chart, lab work & pertinent test results, reviewed documented beta blocker date and time   Airway Mallampati: II TM Distance: >3 FB Neck ROM: full    Dental   Pulmonary Current Smoker,  breath sounds clear to auscultation        Cardiovascular negative cardio ROS  Rhythm:regular     Neuro/Psych PSYCHIATRIC DISORDERS negative neurological ROS     GI/Hepatic Neg liver ROS, GERD-  Medicated and Controlled,  Endo/Other  negative endocrine ROS  Renal/GU negative Renal ROS  negative genitourinary   Musculoskeletal   Abdominal   Peds  Hematology negative hematology ROS (+)   Anesthesia Other Findings See surgeon's H&P   Reproductive/Obstetrics negative OB ROS                           Anesthesia Physical Anesthesia Plan  ASA: II  Anesthesia Plan: General   Post-op Pain Management:    Induction: Intravenous  Airway Management Planned: LMA  Additional Equipment:   Intra-op Plan:   Post-operative Plan: Extubation in OR  Informed Consent: I have reviewed the patients History and Physical, chart, labs and discussed the procedure including the risks, benefits and alternatives for the proposed anesthesia with the patient or authorized representative who has indicated his/her understanding and acceptance.   Dental Advisory Given  Plan Discussed with: CRNA and Surgeon  Anesthesia Plan Comments:         Anesthesia Quick Evaluation

## 2011-08-31 NOTE — Anesthesia Procedure Notes (Addendum)
Anesthesia Regional Block:  Supraclavicular block  Pre-Anesthetic Checklist: ,, timeout performed, Correct Patient, Correct Site, Correct Laterality, Correct Procedure, Correct Position, site marked, Risks and benefits discussed,  Surgical consent,  Pre-op evaluation,  At surgeon's request and post-op pain management  Laterality: Right  Prep: chloraprep       Needles:   Needle Type: Other   (Arrow Echogenic)   Needle Length: 9cm  Needle Gauge: 21    Additional Needles:  Procedures: ultrasound guided Supraclavicular block Narrative:  Start time: 08/31/2011 8:56 AM End time: 08/31/2011 9:04 AM Injection made incrementally with aspirations every 5 mL.  Performed by: Personally  Anesthesiologist: C Frederick  Additional Notes: Ultrasound guidance used to: id relevant anatomy, confirm needle position, local anesthetic spread, avoidance of vascular puncture. Picture saved. No complications. Block performed personally by Janetta Hora. Gelene Mink, MD    Supraclavicular block Procedure Name: LMA Insertion Date/Time: 08/31/2011 10:28 AM Performed by: Caren Macadam Pre-anesthesia Checklist: Patient identified, Emergency Drugs available, Suction available and Patient being monitored Patient Re-evaluated:Patient Re-evaluated prior to inductionOxygen Delivery Method: Circle System Utilized Preoxygenation: Pre-oxygenation with 100% oxygen Intubation Type: IV induction Ventilation: Mask ventilation without difficulty LMA: LMA inserted LMA Size: 4.0 Number of attempts: 1 Airway Equipment and Method: bite block Placement Confirmation: positive ETCO2 and breath sounds checked- equal and bilateral Tube secured with: Tape Dental Injury: Teeth and Oropharynx as per pre-operative assessment

## 2011-08-31 NOTE — Anesthesia Postprocedure Evaluation (Signed)
  Anesthesia Post-op Note  Patient: [name] [name]  Procedure(s) Performed: Procedure(s) (LRB): NERVE REPAIR (Right)  Patient Location: PACU  Anesthesia Type: GA combined with regional for post-op pain  Level of Consciousness: awake, alert  and oriented  Airway and Oxygen Therapy: Patient Spontanous Breathing  Post-op Pain: none  Post-op Assessment: Post-op Vital signs reviewed, Patient's Cardiovascular Status Stable, Respiratory Function Stable, Patent Airway and No signs of Nausea or vomiting  Post-op Vital Signs: Reviewed and stable  Complications: No apparent anesthesia complications

## 2011-08-31 NOTE — H&P (Addendum)
  [name] [name] is an 26 y.o. female.   Chief Complaint: right thumb laceration HPI: 26 yo rhd female states she fell on a piece of glass on 08/16/11 while walking outside resulting in a laceration to right thumb.  Seen at [facility] where wound I&D and sutures placed.  Notes decreased sensation on radial side of right thumb.  Reports no previous injury to thumb and no other injury at this time.  Past Medical History  Diagnosis Date  . GERD (gastroesophageal reflux disease)     OTC as needed  . Laceration of right thumb 08/16/2011    right thumb digital nerve lac.; sutures x 8, per pt.  . Irregular menses     states only 2 periods/year    Past Surgical History  Procedure Date  . No past surgeries     History reviewed. No pertinent family history. Social History:  reports that she has been smoking Cigarettes.  She has smoked for the past 4 years. She has never used smokeless tobacco. She reports that she does not drink alcohol or use illicit drugs.  Allergies:  Allergies  Allergen Reactions  . Sulfonamide Derivatives Hives    No prescriptions prior to admission    No results found for this or any previous visit (from the past 48 hour(s)).  No results found.   A comprehensive review of systems was negative.  Blood pressure 116/77, pulse 65, temperature 98.3 F (36.8 C), temperature source Oral, resp. rate 20, height 5\' 2"  (1.575 m), weight 69.945 kg (154 lb 3.2 oz), last menstrual period 07/16/2011, SpO2 100.00%.  General appearance: alert, cooperative and appears stated age Head: Normocephalic, without obvious abnormality, atraumatic Neck: supple, symmetrical, trachea midline Resp: clear to auscultation bilaterally Cardio: regular rate and rhythm GI: soft, non-tender; bowel sounds normal; no masses,  no organomegaly Extremities: light touch sensation and capillary refill intact all digits except right thumb.  right thumb with no sensation on radial side.  normal sensation on  ulnar side.  +epl/fpl/io.  laceration on volar radial aspect of right thumb.  no other wounds. Pulses: 2+ and symmetric Skin: as above Neurologic: Grossly normal Incision/Wound: As above  Assessment/Plan Right thumb radial digital nerve laceration.  Discussed nature of injury with patient.  Discussed non operative and operative treatment options and she wishes to proceed with operative treatment.  Risks, benefits, and alternatives of surgery were discussed and the patient agrees with the plan of care.   Deloris Mittag R 08/31/2011, 8:46 AM

## 2011-09-01 ENCOUNTER — Encounter (HOSPITAL_BASED_OUTPATIENT_CLINIC_OR_DEPARTMENT_OTHER): Payer: Self-pay | Admitting: Orthopedic Surgery

## 2011-09-01 NOTE — Op Note (Signed)
NAME:  [name], [name]              ACCOUNT NO.:  [id number]  MEDICAL RECORD NO.:  [id number]  LOCATION:  WA22                         FACILITY:  [facility]  PHYSICIAN:  Cadie Sorci, MD        DATE OF BIRTH:  11/24/1985  DATE OF PROCEDURE:  08/31/2011 DATE OF DISCHARGE:  08/17/2011                              OPERATIVE REPORT   PREOPERATIVE DIAGNOSIS:  Left thumb radial digital nerve laceration.  POSTOPERATIVE DIAGNOSIS:  Left thumb radial digital nerve laceration.  PROCEDURE:  Left thumb irrigation and debridement, repair radial digital nerve.  SURGEON:  Cobe Viney, MD.  ASSISTANT:  None.  ANESTHESIA:  General with regional.  IV FLUIDS:  Per anesthesia flow sheet.  ESTIMATED BLOOD LOSS:  Minimal.  COMPLICATIONS:  None.  SPECIMENS:  None.  TOURNIQUET TIME:  42 minutes.  DISPOSITION:  Stable to PACU.  INDICATIONS:  [name] [name] is a 25-year-old female who approximately 2 weeks ago fell onto some broken glass lacerating the left thumb.  She was seen in [facility] where her wound was irrigated and debrided, sutures placed.  She will follow up with me in the office.  She noted decreased sensation on the radial side of the thumb, normal sensation on the ulnar side.  She was able to flex extend the IP joint without pain.  I discussed with [name] [name] the nature of her injury.  Recommended operative exploration and repair of digital nerve lacerated.  Risks, benefits, alternatives of surgery were discussed including risk of blood loss, infection, damage to nerves, vessels, tendons, ligaments, bone; failure of surgery; need for additional surgery, complications, wound healing, continued pain, and continued numbness.  She voiced understanding of these risks and elected to proceed.  OPERATIVE COURSE:  After being identified preoperatively by myself, the patient and I agreed upon the procedure and site of procedure.  Surgical site was marked.  The risks,  benefits, and alternatives of surgery were reviewed and she wished to proceed.  Surgical consent had been signed. She was given 2 g of IV Ancef as preoperative antibiotic prophylaxis. She was given a regional block by Anesthesia in preoperative holding. She was transported to the operating room and placed on the operating table in supine position with left upper extremity on an arm board. General she was induced by the anesthesiologist.  Left upper extremity was prepped and draped in normal sterile orthopedic fashion.  Surgical pause was performed between surgeons, anesthesia, operating staff, and all were in agreement as to the patient, procedure, and site of procedure.  Tourniquet at the proximal aspect of the extremity was inflated to 250 mmHg after exsanguination of the limb with an Esmarch bandage.  The wound was extended both proximally and distally in a Brunner fashion.  The traumatic portion of the wound was more on the radial side of the thumb, did not cross the midline.  The wound was carried into subcutaneous tissues by spreading technique.  The  radial digital nerve was easily identified and it was 100% lacerated.  There was no associated artery that was repairable.  The Ulnar digital nerve was identified and was intact.  The flexor pollicis longus tendon was identified.    There was a small nick in the tendon. This involved only a few fibers.  This was debrided.  No sutures were placed.  The microscope was brought in.  The wound had been copiously irrigated with sterile saline.  The radial digital nerve was repaired using 9-0 nylon in a circumferential interrupted fashion.  This apposed the nerve edges well.  The nerve edges had been freshened prior to placing sutures.  The wound was again copiously irrigated with sterile saline.  It was then closed with 4-0 nylon in a horizontal mattress fashion.  It is dressed sterile Xeroform, 4x4s, and wrapped with Kerlix. A dorsal blocking  splint was placed at the thumb with the MP flexed. The wrist was in neutral position.  This was wrapped with Kerlix and Ace bandage.  Tourniquet was deflated 42 minutes.  The fingertips were pink with brisk capillary refill after deflation of tourniquet.  Operative drapes were broken down.  The patient was awoken from anesthesia safely. She was transferred back to stretcher and taken to PACU in stable condition.  I will see her back in the office in 1 week for postoperative followup.  I will give her Norco 5/325 one to two p.o. q.6 hours p.r.n. pain, dispensed #30.     Suzette Flagler, MD     KK/MEDQ  D:  08/31/2011  T:  09/01/2011  Job:  [id number] 

## 2011-09-01 NOTE — Op Note (Deleted)
[name], [name] NO.:  [id number]  MEDICAL RECORD NO.:  [id number]  LOCATION:  ZO10                         FACILITY:  [facility]  PHYSICIAN:  Betha Loa, MD        DATE OF BIRTH:  January 27, 1985  DATE OF PROCEDURE:  08/31/2011 DATE OF DISCHARGE:  08/17/2011                              OPERATIVE REPORT   PREOPERATIVE DIAGNOSIS:  Left thumb radial digital nerve laceration.  POSTOPERATIVE DIAGNOSIS:  Left thumb radial digital nerve laceration.  PROCEDURE:  Left thumb irrigation and debridement, repair radial digital nerve.  SURGEON:  Betha Loa, MD.  ASSISTANT:  None.  ANESTHESIA:  General with regional.  IV FLUIDS:  Per anesthesia flow sheet.  ESTIMATED BLOOD LOSS:  Minimal.  COMPLICATIONS:  None.  SPECIMENS:  None.  TOURNIQUET TIME:  42 minutes.  DISPOSITION:  Stable to PACU.  INDICATIONS:  [name] is a 26 year old female who approximately 2 weeks ago fell onto some broken glass lacerating the left thumb.  She was seen in [facility] where her wound was irrigated and debrided, sutures placed.  She will follow up with me in the office.  She noted decreased sensation on the radial side of the thumb, normal sensation on the ulnar side.  She was able to flex extend the IP joint without pain.  I discussed with [name] the nature of her injury.  Recommended operative exploration and repair of digital nerve lacerated.  Risks, benefits, alternatives of surgery were discussed including risk of blood loss, infection, damage to nerves, vessels, tendons, ligaments, bone; failure of surgery; need for additional surgery, complications, wound healing, continued pain, and continued numbness.  She voiced understanding of these risks and elected to proceed.  OPERATIVE COURSE:  After being identified preoperatively by myself, the patient and I agreed upon the procedure and site of procedure.  Surgical site was marked.  The risks,  benefits, and alternatives of surgery were reviewed and she wished to proceed.  Surgical consent had been signed. She was given 2 g of IV Ancef as preoperative antibiotic prophylaxis. She was given a regional block by Anesthesia in preoperative holding. She was transported to the operating room and placed on the operating table in supine position with left upper extremity on an arm board. General she was induced by the anesthesiologist.  Left upper extremity was prepped and draped in normal sterile orthopedic fashion.  Surgical pause was performed between surgeons, anesthesia, operating staff, and all were in agreement as to the patient, procedure, and site of procedure.  Tourniquet at the proximal aspect of the extremity was inflated to 250 mmHg after exsanguination of the limb with an Esmarch bandage.  The wound was extended both proximally and distally in a Brunner fashion.  The traumatic portion of the wound was more on the radial side of the thumb, did not cross the midline.  The wound was carried into subcutaneous tissues by spreading technique.  The  radial digital nerve was easily identified and it was 100% lacerated.  There was no associated artery that was repairable.  The Ulnar digital nerve was identified and was intact.  The flexor pollicis longus tendon was identified.  There was a small nick in the tendon. This involved only a few fibers.  This was debrided.  No sutures were placed.  The microscope was brought in.  The wound had been copiously irrigated with sterile saline.  The radial digital nerve was repaired using 9-0 nylon in a circumferential interrupted fashion.  This apposed the nerve edges well.  The nerve edges had been freshened prior to placing sutures.  The wound was again copiously irrigated with sterile saline.  It was then closed with 4-0 nylon in a horizontal mattress fashion.  It is dressed sterile Xeroform, 4x4s, and wrapped with Kerlix. A dorsal blocking  splint was placed at the thumb with the MP flexed. The wrist was in neutral position.  This was wrapped with Kerlix and Ace bandage.  Tourniquet was deflated 42 minutes.  The fingertips were pink with brisk capillary refill after deflation of tourniquet.  Operative drapes were broken down.  The patient was awoken from anesthesia safely. She was transferred back to stretcher and taken to PACU in stable condition.  I will see her back in the office in 1 week for postoperative followup.  I will give her Norco 5/325 one to two p.o. q.6 hours p.r.n. pain, dispensed #30.     Betha Loa, MD     KK/MEDQ  D:  08/31/2011  T:  09/01/2011  Job:  [id number]

## 2012-03-01 IMAGING — CR DG ANKLE COMPLETE 3+V*R*
3 series · 3 of 3 positions shown · non-contrast
Comparison: None.

CLINICAL DATA: Twisted ankle 1 [DATE] weeks ago, with lateral ankle
pain.

RIGHT ANKLE - COMPLETE 3+ VIEW

[t ankle joint ap right]
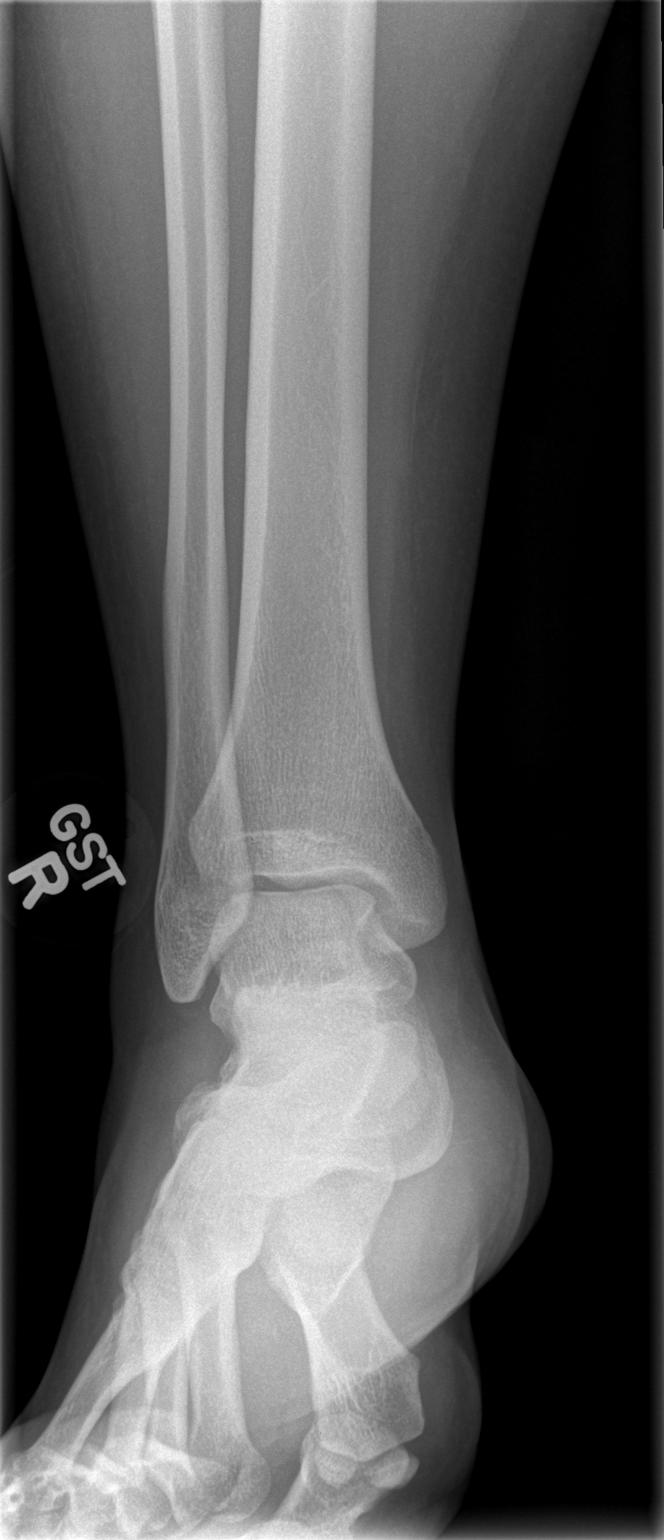

[t ankle joint oblique right]
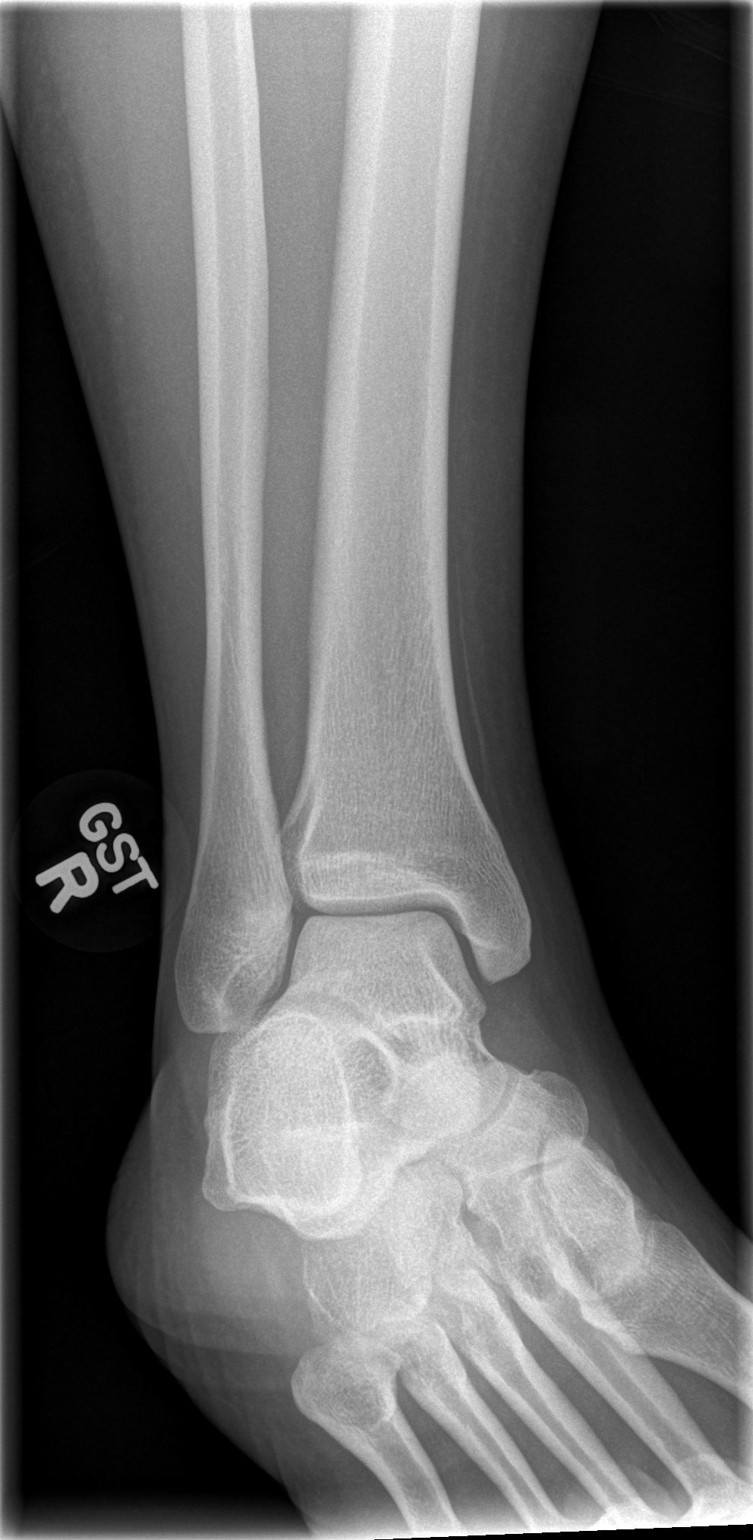

[t ankle joint lat right]
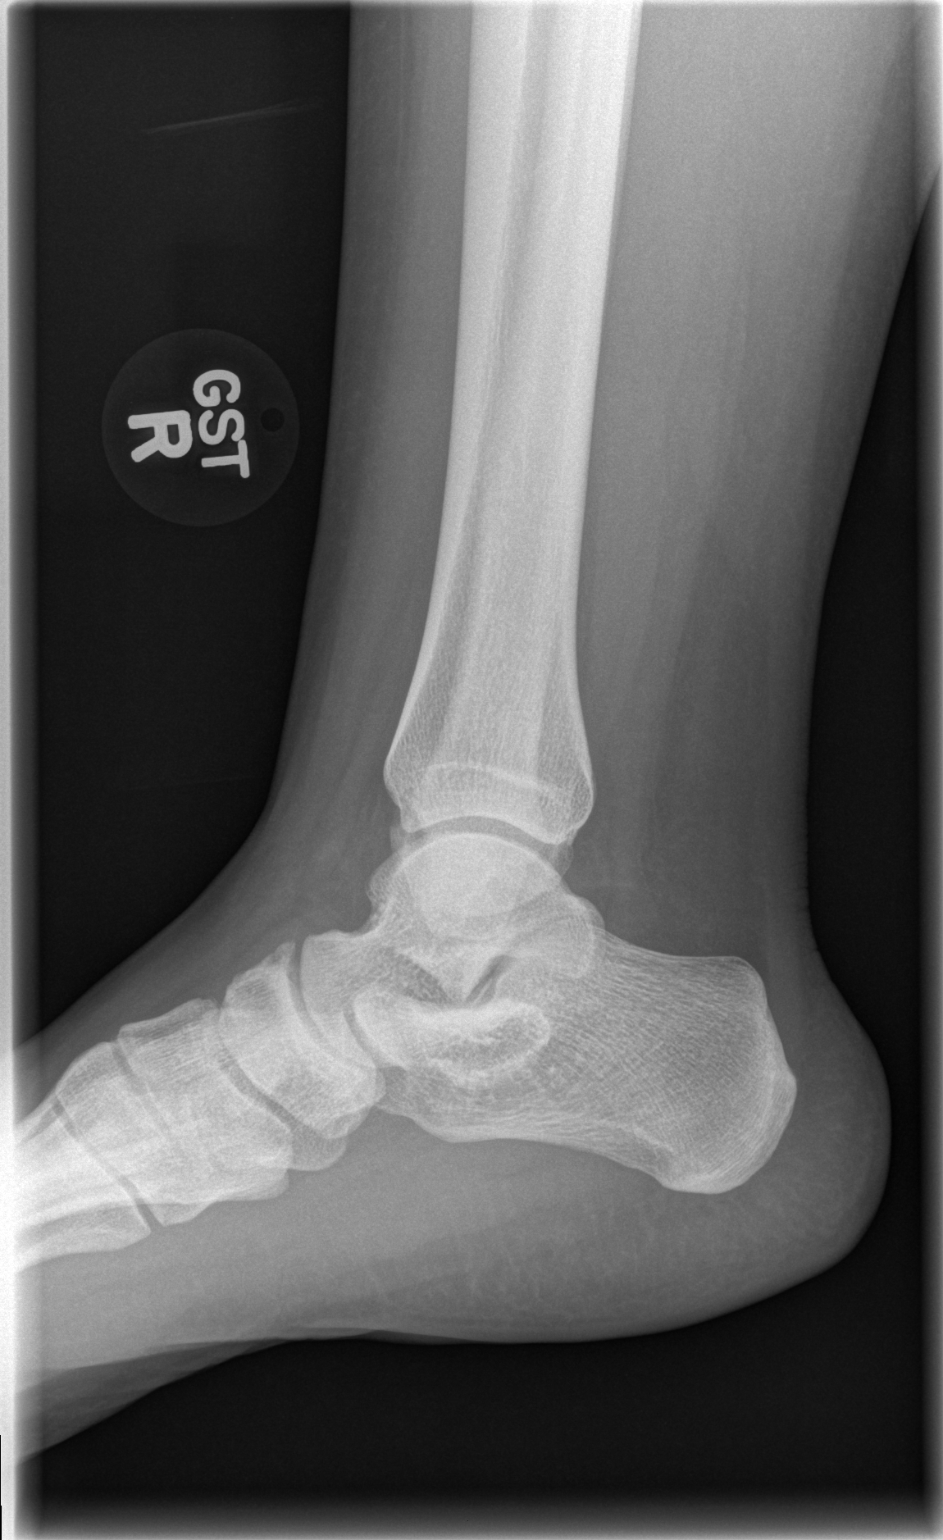

[3 of 3 positions shown; findings below may reference images not displayed]

FINDINGS: There is no evidence of fracture or dislocation.  The
ankle mortise is intact; the interosseous space is within normal
limits.  No talar tilt or subluxation is seen.

The joint spaces are preserved.  No significant soft tissue
abnormalities are seen.
IMPRESSION: No evidence of fracture or dislocation.

## 2013-01-12 NOTE — L&D Delivery Note (Signed)
Delivery Note At 6:40 PM a viable female, "AJ", was delivered via Vaginal, Spontaneous Delivery (Presentation: Left Occiput Anterior).  APGAR: 9, 9; weight 4 lb 0.2 oz (1820 g).   Placenta status: Intact, Spontaneous.  Cord: 3 vessels with the following complications: Cord around shoulder, delivered through.  Cord pH: NA  Baby vigorous at delivery.  NICU team in to assess after delivery--transported to NICU due to low birth weight and IUGR.  Anesthesia: None  Episiotomy:  None Lacerations: Right periurethral Suture Repair: 3.0 vicryl Est. Blood Loss (mL):  250  Mom to postpartum.  Baby to NICU. Family will plan outpatient circumcision after discharge from NICU. Placenta to path.  Nigel BridgemanLATHAM, Jalesa Thien 11/25/2013, 7:40 PM

## 2013-03-09 ENCOUNTER — Encounter: Payer: Self-pay | Admitting: Family Medicine

## 2013-04-23 ENCOUNTER — Other Ambulatory Visit (HOSPITAL_COMMUNITY)
Admission: RE | Admit: 2013-04-23 | Discharge: 2013-04-23 | Disposition: A | Discharge status: Home / Self Care | Payer: BC Managed Care – PPO | Source: Ambulatory Visit | Attending: Emergency Medicine | Admitting: Emergency Medicine

## 2013-04-23 ENCOUNTER — Encounter (HOSPITAL_COMMUNITY): Payer: Self-pay | Admitting: Emergency Medicine

## 2013-04-23 ENCOUNTER — Emergency Department (INDEPENDENT_AMBULATORY_CARE_PROVIDER_SITE_OTHER)
Admission: EM | Admit: 2013-04-23 | Discharge: 2013-04-23 | Disposition: A | Discharge status: Home / Self Care | Payer: BC Managed Care – PPO | Source: Home / Self Care | Attending: Emergency Medicine | Admitting: Emergency Medicine

## 2013-04-23 DIAGNOSIS — N898 Other specified noninflammatory disorders of vagina: Secondary | ICD-10-CM

## 2013-04-23 DIAGNOSIS — Z3201 Encounter for pregnancy test, result positive: Secondary | ICD-10-CM

## 2013-04-23 DIAGNOSIS — N760 Acute vaginitis: Secondary | ICD-10-CM | POA: Insufficient documentation

## 2013-04-23 DIAGNOSIS — Z3490 Encounter for supervision of normal pregnancy, unspecified, unspecified trimester: Secondary | ICD-10-CM

## 2013-04-23 DIAGNOSIS — Z113 Encounter for screening for infections with a predominantly sexual mode of transmission: Secondary | ICD-10-CM | POA: Insufficient documentation

## 2013-04-23 HISTORY — DX: Trichomonal vulvovaginitis: A59.01

## 2013-04-23 HISTORY — DX: Gonococcal infection, unspecified: A54.9

## 2013-04-23 LAB — POCT URINALYSIS DIP (DEVICE)
Bilirubin Urine: NEGATIVE
Glucose, UA: NEGATIVE mg/dL
Hgb urine dipstick: NEGATIVE
LEUKOCYTES UA: NEGATIVE
Nitrite: NEGATIVE
PH: 6 (ref 5.0–8.0)
Protein, ur: NEGATIVE mg/dL
SPECIFIC GRAVITY, URINE: 1.025 (ref 1.005–1.030)
Urobilinogen, UA: 0.2 mg/dL (ref 0.0–1.0)

## 2013-04-23 MED ORDER — PRENATAL 19 29-1 MG PO TABS: ORAL_TABLET | ORAL | Status: DC

## 2013-04-23 NOTE — ED Notes (Signed)
Reports late menstrual cycle; she and spouse are trying to conceive.  Also c/o 2 pruritic, tender, genital lesions without hx of herpes.

## 2013-04-23 NOTE — ED Provider Notes (Signed)
Chief Complaint   Chief Complaint  Patient presents with  . Possible Pregnancy  . Vaginal Itching    History of Present Illness   [name] [name] is a 28 year old female who's last menstrual period was February 26 of March 4. She just a few days late. She has some breast tenderness. She denies any vaginal bleeding. She's had mild pelvic pain but nothing severe. She felt nauseated and dizzy and has had frequent urination. She's also had a two-day history of a mild discharge but denies any odor or itching. She has 2 small bumps on her left labia majora. She is married and is trying to get pregnant.  Review of Systems   Other than as noted above, the patient denies any of the following symptoms: Systemic:  No fever or chills GI:  No abdominal pain, nausea, vomiting, diarrhea, constipation, melena or hematochezia. GU:  No dysuria, frequency, urgency, hematuria, vaginal discharge, itching, or abnormal vaginal bleeding.  PMFSH   Past medical history, family history, social history, meds, and allergies were reviewed.  She does not use alcohol but is a smoker.  Physical Examination    Vital signs:  BP 112/80  Pulse 79  Temp(Src) 98.6 F (37 C) (Oral)  Resp 16  SpO2 99%  LMP 03/10/2013 General:  Alert, oriented and in no distress. Lungs:  Breath sounds clear and equal bilaterally.  No wheezes, rales or rhonchi. Heart:  Regular rhythm.  No gallops or murmers. Abdomen:  Soft, flat and non-distended.  No organomegaly or mass.  No tenderness, guarding or rebound.  Bowel sounds normally active. Pelvic exam:  Normal external genitalia except for 2 tiny firm papules on the left labia majora consistent with ingrown hairs or small cysts. Vaginal and cervical mucosa were normal. There was no vaginal discharge and no odor. No pain on cervical motion. Uterus was normal in size and shape and nontender. No adnexal tenderness or mass.  DNA probes for gonorrhea, Chlamydia, Trichomonas, Gardnerella,  Candida were obtained. Skin:  Clear, warm and dry.  Labs   Results for orders placed during the hospital encounter of 04/23/13  POCT URINALYSIS DIP (DEVICE)      Result Value Ref Range   Glucose, UA NEGATIVE  NEGATIVE mg/dL   Bilirubin Urine NEGATIVE  NEGATIVE   Ketones, ur TRACE (*) NEGATIVE mg/dL   Specific Gravity, Urine 1.025  1.005 - 1.030   Hgb urine dipstick NEGATIVE  NEGATIVE   pH 6.0  5.0 - 8.0   Protein, ur NEGATIVE  NEGATIVE mg/dL   Urobilinogen, UA 0.2  0.0 - 1.0 mg/dL   Nitrite NEGATIVE  NEGATIVE   Leukocytes, UA NEGATIVE  NEGATIVE    Her pregnancy test was positive.  Assessment   The encounter diagnosis was Pregnancy.  This is her first pregnancy. She is very excited about it. I encouraged her to start with prenatal care as soon as possible and make every effort to quit smoking.       Plan    1.  Meds:  The following meds were prescribed:   Discharge Medication List as of 04/23/2013  6:49 PM    START taking these medications   Details  Prenatal Vit-DSS-Fe Fum-FA (PRENATAL 19) 29-1 MG TABS Take 1 daily, Normal        2.  Patient Education/Counseling:  The patient was given appropriate handouts, self care instructions, and instructed in symptomatic relief.  Given handouts about early pregnancy care.  3.  Follow up:  The patient was told  to follow up here if no better in 3 to 4 days, or sooner if becoming worse in any way, and given some red flag symptoms such as worsening pain, fever, persistent vomiting, or heavy vaginal bleeding which would prompt immediate return.  [facility] if she has any pregnancy-related issues.     Reuben Likesavid C Jamoni Broadfoot, MD 04/23/13 2105

## 2013-04-23 NOTE — Discharge Instructions (Signed)
Nausea and vomiting are common during the first 3 months of pregnancy.  This will almost always get better as pregnancy progresses.  There are a number of things you can do to decrease symptoms: ° °· Avoidance of large meals and consumption of low-fat, low-fiber, bland foods (e.g., breads, crackers, cereals, eggs, tofu, lean meat, peanut butter, fruits, vegetables). Avoidance of foods with strong smells and those with increased protein and liquid content is often recommended. Eating light food (such as saltine crackers) early in the morning before getting out of bed can be helpful. ° °· Ginger extract has been shown to be helpful.  The dose is 125 to 250 mg every 6 hours.  Ginger ale or ginger tea may be an acceptable alternative. ° °· The first line medications for morning sickness are a combination of Vitamin B 6 (Pyridoxine ) 10 to 25 mg every 8 hours.  (Side effects are numbness, headache and fatigue) plus Doxylamine (Unisom Sleep Tabs) 12.5 - 25 mg every 8 hours (Side effect is drowsiness).  It has been established that this is safe to take during pregnancy. ° °· There are a number of prescription medications available, so if symptoms persist despite these treatments, see your doctor. ° °· Persistent vomiting especially with severe weakness or dizziness may indicate dehydration.  If this happens, [facility]. ° ° °Pregnancy - First Trimester °During sexual intercourse, millions of sperm go into the vagina. Only 1 sperm will penetrate and fertilize the female egg while it is in the Fallopian tube. One week later, the fertilized egg implants into the wall of the uterus. An embryo begins to develop into a baby. At 6 to 8 weeks, the eyes and face are formed and the heartbeat can be seen on ultrasound. At the end of 12 weeks (first trimester), all the baby's organs are formed. Now that you are pregnant, you will want to do everything you can to have a healthy baby. Two of the most important  things are to get good prenatal care and follow your caregiver's instructions. Prenatal care is all the medical care you receive before the baby's birth. It is given to prevent, find, and treat problems during the pregnancy and childbirth. °PRENATAL EXAMS °· During prenatal visits, your weight, blood pressure, and urine are checked. This is done to make sure you are healthy and progressing normally during the pregnancy. °· A pregnant woman should gain 25 to 35 pounds during the pregnancy. However, if you are overweight or underweight, your caregiver will advise you regarding your weight. °· Your caregiver will ask and answer questions for you. °· Blood work, cervical cultures, other necessary tests, and a Pap test are done during your prenatal exams. These tests are done to check on your health and the probable health of your baby. Tests are strongly recommended and done for HIV with your permission. This is the virus that causes AIDS. These tests are done because medicines can be given to help prevent your baby from being born with this infection should you have been infected without knowing it. Blood work is also used to find out your blood type, previous infections, and follow your blood levels (hemoglobin). °· Low hemoglobin (anemia) is common during pregnancy. Iron and vitamins are given to help prevent this. Later in the pregnancy, blood tests for diabetes will be done along with any other tests if any problems develop. °· You may need other tests to make sure you and the baby are   doing well. °CHANGES DURING THE FIRST TRIMESTER  °Your body goes through many changes during pregnancy. They vary from person to person. Talk to your caregiver about changes you notice and are concerned about. Changes can include: °· Your menstrual period stops. °· The egg and sperm carry the genes that determine what you look like. Genes from you and your partner are forming a baby. The female genes determine whether the baby is a boy  or a girl. °· Your body increases in girth and you may feel bloated. °· Feeling sick to your stomach (nauseous) and throwing up (vomiting). If the vomiting is uncontrollable, call your caregiver. °· Your breasts will begin to enlarge and become tender. °· Your nipples may stick out more and become darker. °· The need to urinate more. Painful urination may mean you have a bladder infection. °· Tiring easily. °· Loss of appetite. °· Cravings for certain kinds of food. °· At first, you may gain or lose a couple of pounds. °· You may have changes in your emotions from day to day (excited to be pregnant or concerned something may go wrong with the pregnancy and baby). °· You may have more vivid and strange dreams. °HOME CARE INSTRUCTIONS  °· It is very important to avoid all smoking, alcohol and non-prescribed drugs during your pregnancy. These affect the formation and growth of the baby. Avoid chemicals while pregnant to ensure the delivery of a healthy infant. °· Start your prenatal visits by the 12th week of pregnancy. They are usually scheduled monthly at first, then more often in the last 2 months before delivery. Keep your caregiver's appointments. Follow your caregiver's instructions regarding medicine use, blood and lab tests, exercise, and diet. °· During pregnancy, you are providing food for you and your baby. Eat regular, well-balanced meals. Choose foods such as meat, fish, milk and other low fat dairy products, vegetables, fruits, and whole-grain breads and cereals. Your caregiver will tell you of the ideal weight gain. °· You can help morning sickness by keeping soda crackers at the bedside. Eat a couple before arising in the morning. You may want to use the crackers without salt on them. °· Eating 4 to 5 small meals rather than 3 large meals a day also may help the nausea and vomiting. °· Drinking liquids between meals instead of during meals also seems to help nausea and vomiting. °· A physical sexual  relationship may be continued throughout pregnancy if there are no other problems. Problems may be early (premature) leaking of amniotic fluid from the membranes, vaginal bleeding, or belly (abdominal) pain. °· Exercise regularly if there are no restrictions. Check with your caregiver or physical therapist if you are unsure of the safety of some of your exercises. Greater weight gain will occur in the last 2 trimesters of pregnancy. Exercising will help: °· Control your weight. °· Keep you in shape. °· Prepare you for labor and delivery. °· Help you lose your pregnancy weight after you deliver your baby. °· Wear a good support or jogging bra for breast tenderness during pregnancy. This may help if worn during sleep too. °· Ask when prenatal classes are available. Begin classes when they are offered. °· Do not use hot tubs, steam rooms, or saunas. °· Wear your seat belt when driving. This protects you and your baby if you are in an accident. °· Avoid raw meat, uncooked cheese, cat litter boxes, and soil used by cats throughout the pregnancy. These carry germs that can cause   birth defects in the baby. °· The first trimester is a good time to visit your dentist for your dental health. Getting your teeth cleaned is okay. Use a softer toothbrush and brush gently during pregnancy. °· Ask for help if you have financial, counseling, or nutritional needs during pregnancy. Your caregiver will be able to offer counseling for these needs as well as refer you for other special needs. °· Do not take any medicines or herbs unless told by your caregiver. °· Inform your caregiver if there is any mental or physical domestic violence. °· Make a list of emergency phone numbers of family, friends, hospital, and police and fire departments. °· Write down your questions. Take them to your prenatal visit. °· Do not douche. °· Do not cross your legs. °· If you have to stand for long periods of time, rotate you feet or take small steps in a  circle. °· You may have more vaginal secretions that may require a sanitary pad. Do not use tampons or scented sanitary pads. °MEDICINES AND DRUG USE IN PREGNANCY °· Take prenatal vitamins as directed. The vitamin should contain 1 milligram of folic acid. Keep all vitamins out of reach of children. Only a couple vitamins or tablets containing iron may be fatal to a baby or young child when ingested. °· Avoid use of all medicines, including herbs, over-the-counter medicines, not prescribed or suggested by your caregiver. Only take over-the-counter or prescription medicines for pain, discomfort, or fever as directed by your caregiver. Do not use aspirin, ibuprofen, or naproxen unless directed by your caregiver. °· Let your caregiver also know about herbs you may be using. °· Alcohol is related to a number of birth defects. This includes fetal alcohol syndrome. All alcohol, in any form, should be avoided completely. Smoking will cause low birth rate and premature babies. °· Street or illegal drugs are very harmful to the baby. They are absolutely forbidden. A baby born to an addicted mother will be addicted at birth. The baby will go through the same withdrawal an adult does. °· Let your caregiver know about any medicines that you have to take and for what reason you take them. °SEEK MEDICAL CARE IF:  °You have any concerns or worries during your pregnancy. It is better to call with your questions if you feel they cannot wait, rather than worry about them. °SEEK IMMEDIATE MEDICAL CARE IF:  °· An unexplained oral temperature above 102° F (38.9° C) develops, or as your caregiver suggests. °· You have leaking of fluid from the vagina (birth canal). If leaking membranes are suspected, take your temperature and inform your caregiver of this when you call. °· There is vaginal spotting or bleeding. Notify your caregiver of the amount and how many pads are used. °· You develop a bad smelling vaginal discharge with a change in  the color. °· You continue to feel sick to your stomach (nauseated) and have no relief from remedies suggested. You vomit blood or coffee ground-like materials. °· You lose more than 2 pounds of weight in 1 week. °· You gain more than 2 pounds of weight in 1 week and you notice swelling of your face, hands, feet, or legs. °· You gain 5 pounds or more in 1 week (even if you do not have swelling of your hands, face, legs, or feet). °· You get exposed to German measles and have never had them. °· You are exposed to fifth disease or chickenpox. °· You develop belly (abdominal) pain.   Round ligament discomfort is a common non-cancerous (benign) cause of abdominal pain in pregnancy. Your caregiver still must evaluate this. °· You develop headache, fever, diarrhea, pain with urination, or shortness of breath. °· You fall or are in a car accident or have any kind of trauma. °· There is mental or physical violence in your home. °Document Released: 12/23/2000 Document Revised: 09/23/2011 Document Reviewed: 06/26/2008 °ExitCare® Patient Information ©2014 [employer]. ° °

## 2013-04-24 LAB — CERVICOVAGINAL ANCILLARY ONLY
Chlamydia: NEGATIVE
Neisseria Gonorrhea: NEGATIVE
WET PREP (BD AFFIRM): NEGATIVE
Wet Prep (BD Affirm): NEGATIVE
Wet Prep (BD Affirm): POSITIVE — AB

## 2013-04-24 NOTE — Progress Notes (Signed)
Quick Note:  Test result was normal. No further action is needed at this time. ______ 

## 2013-04-26 ENCOUNTER — Telehealth (HOSPITAL_COMMUNITY): Payer: Self-pay | Admitting: Emergency Medicine

## 2013-04-26 MED ORDER — METRONIDAZOLE 500 MG PO TABS: 500.0000 mg | ORAL_TABLET | Freq: Two times a day (BID) | ORAL | Status: DC

## 2013-04-26 NOTE — Telephone Encounter (Signed)
Message copied by Reuben LikesKELLER, Jep Dyas C on Wed Apr 26, 2013  9:08 PM ------      Message from: Vassie MoselleYORK, SUZANNE M      Created: Wed Apr 26, 2013  4:55 PM      Regarding: lab       Gardnerella pos., rest of labs neg. Do you want to treat this?      04/26/2013       ------

## 2013-04-26 NOTE — ED Notes (Signed)
The patient's DNA probe came back positive for Gardnerella. She will need metronidazole 500 mg, #14, 1 twice a day for one week. This will be sent to her pharmacy. We will need to call her and let her know these results.   Reuben Likesavid C Dilynn Munroe, MD 04/26/13 [id number]

## 2013-04-27 ENCOUNTER — Telehealth (HOSPITAL_COMMUNITY): Payer: Self-pay | Admitting: *Deleted

## 2013-04-27 NOTE — ED Notes (Signed)
Pt. called back.  Pt. verified x 2 and given results.  Pt. told she needs Falgyl for bacterial vaginosis.  Pt. instructed to no alcohol while taking this medication.  Pt. told where to pick up her Rx.'s. [name] [name] 04/27/2013

## 2013-05-03 LAB — OB RESULTS CONSOLE ABO/RH: RH Type: POSITIVE

## 2013-05-03 LAB — OB RESULTS CONSOLE RPR: RPR: NONREACTIVE

## 2013-05-03 LAB — OB RESULTS CONSOLE GC/CHLAMYDIA
CHLAMYDIA, DNA PROBE: NEGATIVE
GC PROBE AMP, GENITAL: NEGATIVE

## 2013-05-03 LAB — OB RESULTS CONSOLE HIV ANTIBODY (ROUTINE TESTING): HIV: NONREACTIVE

## 2013-05-03 LAB — OB RESULTS CONSOLE HEPATITIS B SURFACE ANTIGEN: HEP B S AG: NEGATIVE

## 2013-05-03 LAB — OB RESULTS CONSOLE ANTIBODY SCREEN: Antibody Screen: NEGATIVE

## 2013-05-03 LAB — OB RESULTS CONSOLE RUBELLA ANTIBODY, IGM: RUBELLA: IMMUNE

## 2013-05-05 ENCOUNTER — Encounter (HOSPITAL_COMMUNITY): Payer: Self-pay | Admitting: *Deleted

## 2013-05-05 ENCOUNTER — Inpatient Hospital Stay (HOSPITAL_COMMUNITY)
Admission: AD | Admit: 2013-05-05 | Discharge: 2013-05-05 | Disposition: A | Discharge status: Home / Self Care | Payer: BC Managed Care – PPO | Source: Ambulatory Visit | Attending: Obstetrics and Gynecology | Admitting: Obstetrics and Gynecology

## 2013-05-05 DIAGNOSIS — K5900 Constipation, unspecified: Secondary | ICD-10-CM | POA: Insufficient documentation

## 2013-05-05 DIAGNOSIS — Z87891 Personal history of nicotine dependence: Secondary | ICD-10-CM | POA: Insufficient documentation

## 2013-05-05 DIAGNOSIS — O210 Mild hyperemesis gravidarum: Secondary | ICD-10-CM | POA: Insufficient documentation

## 2013-05-05 LAB — URINALYSIS, ROUTINE W REFLEX MICROSCOPIC
Bilirubin Urine: NEGATIVE
Glucose, UA: NEGATIVE mg/dL
Hgb urine dipstick: NEGATIVE
Ketones, ur: NEGATIVE mg/dL
LEUKOCYTES UA: NEGATIVE
NITRITE: NEGATIVE
Protein, ur: NEGATIVE mg/dL
SPECIFIC GRAVITY, URINE: 1.01 (ref 1.005–1.030)
UROBILINOGEN UA: 0.2 mg/dL (ref 0.0–1.0)
pH: 7 (ref 5.0–8.0)

## 2013-05-05 MED ORDER — ONDANSETRON 4 MG PO TBDP
4.0000 mg | ORAL_TABLET | Freq: Once | ORAL | Status: AC
  Administered 2013-05-05: 4 mg via ORAL
  Filled 2013-05-05: qty 1

## 2013-05-05 MED ORDER — DOXYLAMINE-PYRIDOXINE 10-10 MG PO TBEC: 2.0000 | DELAYED_RELEASE_TABLET | Freq: Every day | ORAL | Status: DC

## 2013-05-05 NOTE — MAU Provider Note (Signed)
History   27yo G1 P0 at 8 1/7 weeks c/o n/v x 24hrs and constipation x 1 week.  Pt was able to tolerate a banana last night.   Denies VB, recent fever, resp or GI c/o's, UTI s/s.    Patient Active Problem List   Diagnosis Date Noted  . 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 GONORRHEA 02/17/2010  . TOBACCO ABUSE 11/05/2009  . SHOULDER PAIN, RIGHT 11/05/2009  . ANKLE SPRAIN, RIGHT 11/05/2009  . ALLERGIC RHINITIS 09/17/2009  . GERD 09/17/2009  . CHEST PAIN UNSPECIFIED 09/17/2009    Chief Complaint  Patient presents with  . Morning Sickness   HPI  OB History   Grav Para Term Preterm Abortions TAB SAB Ect Mult Living   1               Past Medical History  Diagnosis Date  . GERD (gastroesophageal reflux disease)     OTC as needed  . Laceration of right thumb 08/16/2011    right thumb digital nerve lac.; sutures x 8, per pt.  . Irregular menses     states only 2 periods/year  . Gonorrhea   . Trichomonas vaginitis     Past Surgical History  Procedure Laterality Date  . No past surgeries    . Nerve repair  08/31/2011    Procedure: NERVE REPAIR;  Surgeon: Tami RibasKevin R Kuzma, MD;  Location: [facility];  Service: Orthopedics;  Laterality: Right;  right thumb digital nerve repair    Family History  Problem Relation Age of Onset  . Cancer Maternal Grandfather     lung  . Stroke Maternal Grandfather   . Hearing loss Neg Hx     History  Substance Use Topics  . Smoking status: Former Smoker -- 4 years    Types: Cigarettes  . Smokeless tobacco: Never Used     Comment: quit with pos preg  . Alcohol Use: No    Allergies:  Allergies  Allergen Reactions  . Sulfonamide Derivatives Hives    Prescriptions prior to admission  Medication Sig Dispense Refill  . Prenatal Vit-Fe Fumarate-FA (PRENATAL MULTIVITAMIN) TABS tablet Take 1 tablet by mouth daily at 12 noon.        ROS ROS: see HPI above, all other systems are negative  Physical Exam    Blood pressure 116/62,  pulse 79, temperature 97.4 F (36.3 C), temperature source Oral, resp. rate 16, height 5\' 2"  (1.575 m), weight 154 lb (69.854 kg), last menstrual period 03/10/2013.   Physical Exam Chest: Clear Heart: RRR Abdomen: NT Extremities: WNL  ED Course  IUP @ 8.1 weeks N/V of pregnancy  Zofran 4mg  ODT once Tolerating PO fluids DC pt to home with precautions Rx Diclegis    Haroldine LawsJennifer Raziah Funnell CNM, MSN 05/05/2013 2:00 PM

## 2013-05-05 NOTE — MAU Note (Addendum)
Had not been experiencing any problems.  Started feeling sick last night.  Can't hold anything down.  Has been constipated.  Pt states UC told them she was pregnant, can not find test result.

## 2013-05-05 NOTE — Discharge Instructions (Signed)
Recommended laxatives: dulcolax 2 tabs at night Diclegis: 2 tabs PO daily at bedtime to be taken daily If symptoms persist, add 1 tab daily in the morning (can start day 3) If symptoms persist, add 1 tab daily in the afternoon (can start day 4)  Hyperemesis Gravidarum Hyperemesis gravidarum is a severe form of nausea and vomiting that happens during pregnancy. Hyperemesis is worse than morning sickness. It may cause you to have nausea or vomiting all day for many days. It may keep you from eating and drinking enough food and liquids. Hyperemesis usually occurs during the first half (the first 20 weeks) of pregnancy. It often goes away once a woman is in her second half of pregnancy. However, sometimes hyperemesis continues through an entire pregnancy.  CAUSES  The cause of this condition is not completely known but is thought to be related to changes in the body's hormones when pregnant. It could be from the high level of the pregnancy hormone or an increase in estrogen in the body.  SIGNS AND SYMPTOMS   Severe nausea and vomiting.  Nausea that does not go away.  Vomiting that does not allow you to keep any food down.  Weight loss and body fluid loss (dehydration).  Having no desire to eat or not liking food you have previously enjoyed. DIAGNOSIS  Your health care provider will do a physical exam and ask you about your symptoms. He or she may also order blood tests and urine tests to make sure something else is not causing the problem.  TREATMENT  You may only need medicine to control the problem. If medicines do not control the nausea and vomiting, you will be treated in the hospital to prevent dehydration, increased acid in the blood (acidosis), weight loss, and changes in the electrolytes in your body that may harm the unborn baby (fetus). You may need IV fluids.  HOME CARE INSTRUCTIONS   Only take over-the-counter or prescription medicines as directed by your health care  provider.  Try eating a couple of dry crackers or toast in the morning before getting out of bed.  Avoid foods and smells that upset your stomach.  Avoid fatty and spicy foods.  Eat 5 6 small meals a day.  Do not drink when eating meals. Drink between meals.  For snacks, eat high-protein foods, such as cheese.  Eat or suck on things that have ginger in them. Ginger helps nausea.  Avoid food preparation. The smell of food can spoil your appetite.  Avoid iron pills and iron in your multivitamins until after 3 4 months of being pregnant. However, consult with your health care provider before stopping any prescribed iron pills. SEEK MEDICAL CARE IF:   Your abdominal pain increases.  You have a severe headache.  You have vision problems.  You are losing weight. SEEK IMMEDIATE MEDICAL CARE IF:   You are unable to keep fluids down.  You vomit blood.  You have constant nausea and vomiting.  You have excessive weakness.  You have extreme thirst.  You have dizziness or fainting.  You have a fever or persistent symptoms for more than 2 3 days.  You have a fever and your symptoms suddenly get worse. MAKE SURE YOU:   Understand these instructions.  Will watch your condition.  Will get help right away if you are not doing well or get worse. Document Released: 12/29/2004 Document Revised: 10/19/2012 Document Reviewed: 08/10/2012 [facility] Patient Information 2014 [place], [place].  Hyperemesis Gravidarum Diet Hyperemesis gravidarum  is a severe form of morning sickness. It is characterized by frequent and severe vomiting. It happens during the first trimester of pregnancy. It may be caused by the rapid hormone changes that happen during pregnancy. It is associated with a 5% weight loss of pre-pregnancy weight. The hyperemesis diet may be used to lessen symptoms of nausea and vomiting. EATING GUIDELINES  Eat 5 to 6 small meals daily instead of 3 large meals.  Avoid foods  with strong smells.  Avoid drinking 30 minutes before and after meals.  Avoid fried or high-fat foods, such as butter and cream sauces.  Starchy foods are usually well-tolerated, such as cereal, toast, bread, potatoes, pasta, rice, and pretzels.  Eat crackers before you get out of bed in the morning.  Avoid spicy foods.  Ginger may help with nausea. Add  tsp ginger to hot tea or choose ginger tea.  Continue to take your prenatal vitamins as directed by your caregiver. SAMPLE MEAL PLAN Breakfast    cup oatmeal  1 slice toast  1 tsp heart-healthy margarine  1 tsp jelly  1 scrambled egg Midmorning Snack   1 cup low-fat yogurt Lunch   Plain ham sandwich  Carrot or celery sticks  1 small apple  3 graham crackers Midafternoon Snack   Cheese and crackers Dinner  4 oz pork tenderloin  1 small baked potato  1 tsp margarine   cup broccoli   cup grapes Evening Snack  1 cup pudding Document Released: 10/26/2006 Document Revised: 03/23/2011 Document Reviewed: 05/31/2012 Exit[employer] Patient Information 2014 [place], [place].

## 2013-05-16 ENCOUNTER — Encounter: Payer: BC Managed Care – PPO | Admitting: Obstetrics and Gynecology

## 2013-05-16 ENCOUNTER — Encounter: Payer: Self-pay | Admitting: Obstetrics & Gynecology

## 2013-06-07 LAB — US OB COMP LESS 14 WKS

## 2013-06-09 ENCOUNTER — Encounter: Payer: Self-pay | Admitting: General Practice

## 2013-07-28 ENCOUNTER — Other Ambulatory Visit (HOSPITAL_COMMUNITY): Payer: Self-pay | Admitting: Obstetrics and Gynecology

## 2013-07-28 DIAGNOSIS — O283 Abnormal ultrasonic finding on antenatal screening of mother: Secondary | ICD-10-CM

## 2013-07-28 DIAGNOSIS — IMO0002 Reserved for concepts with insufficient information to code with codable children: Secondary | ICD-10-CM

## 2013-08-11 ENCOUNTER — Ambulatory Visit (HOSPITAL_COMMUNITY)
Admission: RE | Admit: 2013-08-11 | Discharge: 2013-08-11 | Disposition: A | Discharge status: Home / Self Care | Payer: BC Managed Care – PPO | Source: Ambulatory Visit | Attending: Obstetrics and Gynecology | Admitting: Obstetrics and Gynecology

## 2013-08-11 ENCOUNTER — Encounter (HOSPITAL_COMMUNITY): Payer: Self-pay

## 2013-08-11 DIAGNOSIS — O283 Abnormal ultrasonic finding on antenatal screening of mother: Secondary | ICD-10-CM

## 2013-08-11 DIAGNOSIS — Z3689 Encounter for other specified antenatal screening: Secondary | ICD-10-CM | POA: Insufficient documentation

## 2013-08-11 DIAGNOSIS — IMO0002 Reserved for concepts with insufficient information to code with codable children: Secondary | ICD-10-CM

## 2013-08-11 DIAGNOSIS — O289 Unspecified abnormal findings on antenatal screening of mother: Secondary | ICD-10-CM | POA: Insufficient documentation

## 2013-08-11 DIAGNOSIS — O368390 Maternal care for abnormalities of the fetal heart rate or rhythm, unspecified trimester, not applicable or unspecified: Secondary | ICD-10-CM | POA: Insufficient documentation

## 2013-08-11 NOTE — Consult Note (Signed)
MFM Consultation:  Impressions: SIUP at [redacted]w[redacted]d EFW 19th%, AC 10th% Echogenic intracardiac focus Left-sided urinary tract dilation (4mm, formerly known as pyelectasis) Low risk 1st trimester screen Patient remains low risk  idiopathic elevation of AFP (1st trimester) suspected arrhythmia not found today (normal rhythm, likely was PACs or premature atrial contractions)  Discussion:  1. Mild urinary tract (UT) dilation, formerly known as pyelectasis, was identified. UT dilation is observed in about 1 - 2 % of pregnancies at around 18-20 weeks of gestation. Most instances are of no pathological significance. Classification is based upon AP renal pelvic diameter, calyceal dilation, renal parenchymal thickness and appearance, bladder abnormalities, and ureteral abnormalities. Based upon today's images, the renal pelvis AP diameter measurement of 4.79mm for the left, presence of (normal) renal parenchyma and calyceal architecture (with/without) dilation constitute UT dilation as an indication for reassessment around 30-32 weeks. Up to 85% of cases identified as measuring at 4mm or more at 18-20 weeks will measure less than 7mm at 30-32 weeks and require no further follow up. The remainder that progress in dilation and do measure in excess of 7mm of dilation will have follow up arranged by our unit as felt appropriate by the evaluating perinatologist.  2. Echogenic intracardiac focus (EIF):  In addition, there is an echogenic intracardiac focus of the left ventricle. I demonstrated the echogenic intracardiac focus (EIF) on the ultrasound screen.  This finding has been implicated as a marker for increased risk of aneuploidy, especially Trisomy 21. However, it is also seen as a normal variant in 2-4% of all pregnancy ultrasound exams.  Furthermore, echogenic focus is not a "heart defect" and no special fetal surveillance or neonatal cardiac evaluation is recommended.  With her first trimester screen risk  1:10,000 for T21 and absence of other sonographic findings,her risk for chromosome abnormality is not increased to the point that amniocentesis or cell free fetal DNA is recommended (it remains low risk at 10-fold higher than screen related risk with 2 markers or 1:1,000 (0.1%).  She was told that she could nonetheless request either but she declined, citing comfort with the 0.1% risk I gave her.  3. Fetal arrhythmia, not found (likely PAC's or premature atrial contractions):  The patient is referred for evaluation of skipped heart beat heard on doptone in the office.   M-mode echocardiography was performed.    The fetal heart rate pattern on M-mode reveals intermittent premature atrial contractions. Premature atrial contractions (PAC's) are the most common fetal arrhythmia and are usually self-limited. Most resolve by the time of delivery and rarely progress to cause problems in the neonate. PACs are generally not associated with cardiac structural defects.  We discussed this is most often a benign finding which resolves prior to delivery, but rarely these can lead to SVT which is associated with decreased fetal movement. Therefore, we recommend the patient continue with kick counts and notify you if there is decreased fetal movement.  Clinical surveillance via fetal heart rate auscultation (Doptone) is recommended on a weekly basis.   4. AFP elevation in first trimester screen in absence of structural defects:  I discussed increased risk for IUGR, stillbirth, and preeclampsia and the need for interval growth assessments in conjunction with routine prenatal care.    Summary of Recommendations: 1. Idiopathic AFP:  Interval growth in 4 weeks 2. Suspected PAC's:  Weekly Doptone in the office.  Time Spent: I spent in excess of 40 minutes in consultation with this patient to review records, evaluate her case, and  provide her with an adequate discussion and education.  More than 50% of this time was spent in  direct face-to-face counseling. It was a pleasure seeing your patient in the office today.  Thank you for consultation. Please do not hesitate to contact our service for any further questions.   Thank you,  Louann SjogrenJeffrey Morgan Gaynelle Arabianenney   Jadwiga Faidley, Louann SjogrenJeffrey Morgan, MD, MS, FACOG Assistant Professor Section of Maternal-Fetal Medicine [facility]

## 2013-08-24 ENCOUNTER — Other Ambulatory Visit (HOSPITAL_COMMUNITY): Payer: Self-pay | Admitting: Obstetrics and Gynecology

## 2013-08-24 DIAGNOSIS — O358XX0 Maternal care for other (suspected) fetal abnormality and damage, not applicable or unspecified: Secondary | ICD-10-CM

## 2013-08-24 DIAGNOSIS — O368390 Maternal care for abnormalities of the fetal heart rate or rhythm, unspecified trimester, not applicable or unspecified: Secondary | ICD-10-CM

## 2013-09-01 ENCOUNTER — Encounter: Payer: Self-pay | Admitting: General Practice

## 2013-09-04 ENCOUNTER — Encounter (HOSPITAL_COMMUNITY): Payer: Self-pay | Admitting: *Deleted

## 2013-09-04 ENCOUNTER — Inpatient Hospital Stay (HOSPITAL_COMMUNITY)
Admission: AD | Admit: 2013-09-04 | Discharge: 2013-09-04 | Disposition: A | Discharge status: Home / Self Care | Payer: BC Managed Care – PPO | Source: Ambulatory Visit | Attending: Obstetrics and Gynecology | Admitting: Obstetrics and Gynecology

## 2013-09-04 DIAGNOSIS — O99891 Other specified diseases and conditions complicating pregnancy: Secondary | ICD-10-CM | POA: Diagnosis not present

## 2013-09-04 DIAGNOSIS — N949 Unspecified condition associated with female genital organs and menstrual cycle: Secondary | ICD-10-CM | POA: Diagnosis not present

## 2013-09-04 DIAGNOSIS — N898 Other specified noninflammatory disorders of vagina: Secondary | ICD-10-CM | POA: Diagnosis present

## 2013-09-04 DIAGNOSIS — O9989 Other specified diseases and conditions complicating pregnancy, childbirth and the puerperium: Principal | ICD-10-CM

## 2013-09-04 LAB — WET PREP, GENITAL: TRICH WET PREP: NONE SEEN

## 2013-09-04 MED ORDER — METRONIDAZOLE 500 MG PO TABS: 500.0000 mg | ORAL_TABLET | Freq: Two times a day (BID) | ORAL | Status: AC

## 2013-09-04 MED ORDER — TERCONAZOLE 0.4 % VA CREA: 1.0000 | TOPICAL_CREAM | Freq: Every day | VAGINAL | Status: DC

## 2013-09-04 NOTE — Discharge Instructions (Signed)
Bacterial Vaginosis Bacterial vaginosis is a vaginal infection that occurs when the normal balance of bacteria in the vagina is disrupted. It results from an overgrowth of certain bacteria. This is the most common vaginal infection in women of childbearing age. Treatment is important to prevent complications, especially in pregnant women, as it can cause a premature delivery. CAUSES  Bacterial vaginosis is caused by an increase in harmful bacteria that are normally present in smaller amounts in the vagina. Several different kinds of bacteria can cause bacterial vaginosis. However, the reason that the condition develops is not fully understood. RISK FACTORS Certain activities or behaviors can put you at an increased risk of developing bacterial vaginosis, including:  Having a new sex partner or multiple sex partners.  Douching.  Using an intrauterine device (IUD) for contraception. Women do not get bacterial vaginosis from toilet seats, bedding, swimming pools, or contact with objects around them. SIGNS AND SYMPTOMS  Some women with bacterial vaginosis have no signs or symptoms. Common symptoms include:  Grey vaginal discharge.  A fishlike odor with discharge, especially after sexual intercourse.  Itching or burning of the vagina and vulva.  Burning or pain with urination. DIAGNOSIS  Your health care provider will take a medical history and examine the vagina for signs of bacterial vaginosis. A sample of vaginal fluid may be taken. Your health care provider will look at this sample under a microscope to check for bacteria and abnormal cells. A vaginal pH test may also be done.  TREATMENT  Bacterial vaginosis may be treated with antibiotic medicines. These may be given in the form of a pill or a vaginal cream. A second round of antibiotics may be prescribed if the condition comes back after treatment.  HOME CARE INSTRUCTIONS   Only take over-the-counter or prescription medicines as  directed by your health care provider.  If antibiotic medicine was prescribed, take it as directed. Make sure you finish it even if you start to feel better.  Do not have sex until treatment is completed.  Tell all sexual partners that you have a vaginal infection. They should see their health care provider and be treated if they have problems, such as a mild rash or itching.  Practice safe sex by using condoms and only having one sex partner. SEEK MEDICAL CARE IF:   Your symptoms are not improving after 3 days of treatment.  You have increased discharge or pain.  You have a fever. MAKE SURE YOU:   Understand these instructions.  Will watch your condition.  Will get help right away if you are not doing well or get worse. FOR MORE INFORMATION  Centers for Disease Control and Prevention, Division of STD Prevention: www.cdc.gov/std American Sexual Health Association (ASHA): www.ashastd.org  Document Released: 12/29/2004 Document Revised: 10/19/2012 Document Reviewed: 08/10/2012 ExitCare Patient Information 2015 [employer]. This information is not intended to replace advice given to you by your health care provider. Make sure you discuss any questions you have with your health care provider. Monilial Vaginitis Vaginitis in a soreness, swelling and redness (inflammation) of the vagina and vulva. Monilial vaginitis is not a sexually transmitted infection. CAUSES  Yeast vaginitis is caused by yeast (candida) that is normally found in your vagina. With a yeast infection, the candida has overgrown in number to a point that upsets the chemical balance. SYMPTOMS   White, thick vaginal discharge.  Swelling, itching, redness and irritation of the vagina and possibly the lips of the vagina (vulva).  Burning or painful   urination.  Painful intercourse. DIAGNOSIS  Things that may contribute to monilial vaginitis are:  Postmenopausal and virginal  states.  Pregnancy.  Infections.  Being tired, sick or stressed, especially if you had monilial vaginitis in the past.  Diabetes. Good control will help lower the chance.  Birth control pills.  Tight fitting garments.  Using bubble bath, feminine sprays, douches or deodorant tampons.  Taking certain medications that kill germs (antibiotics).  Sporadic recurrence can occur if you become ill. TREATMENT  Your caregiver will give you medication.  There are several kinds of anti monilial vaginal creams and suppositories specific for monilial vaginitis. For recurrent yeast infections, use a suppository or cream in the vagina 2 times a week, or as directed.  Anti-monilial or steroid cream for the itching or irritation of the vulva may also be used. Get your caregiver's permission.  Painting the vagina with methylene blue solution may help if the monilial cream does not work.  Eating yogurt may help prevent monilial vaginitis. HOME CARE INSTRUCTIONS   Finish all medication as prescribed.  Do not have sex until treatment is completed or after your caregiver tells you it is okay.  Take warm sitz baths.  Do not douche.  Do not use tampons, especially scented ones.  Wear cotton underwear.  Avoid tight pants and panty hose.  Tell your sexual partner that you have a yeast infection. They should go to their caregiver if they have symptoms such as mild rash or itching.  Your sexual partner should be treated as well if your infection is difficult to eliminate.  Practice safer sex. Use condoms.  Some vaginal medications cause latex condoms to fail. Vaginal medications that harm condoms are:  Cleocin cream.  Butoconazole (Femstat).  Terconazole (Terazol) vaginal suppository.  Miconazole (Monistat) (may be purchased over the counter). SEEK MEDICAL CARE IF:   You have a temperature by mouth above 102 F (38.9 C).  The infection is getting worse after 2 days of  treatment.  The infection is not getting better after 3 days of treatment.  You develop blisters in or around your vagina.  You develop vaginal bleeding, and it is not your menstrual period.  You have pain when you urinate.  You develop intestinal problems.  You have pain with sexual intercourse. Document Released: 10/08/2004 Document Revised: 03/23/2011 Document Reviewed: 06/22/2008 ExitCare Patient Information 2015 [employer]. This information is not intended to replace advice given to you by your health care provider. Make sure you discuss any questions you have with your health care provider.  

## 2013-09-04 NOTE — MAU Note (Signed)
Pt reports vaginal discharge and irritation. Vaginal area feels swollen. Symptoms since yesterday.

## 2013-09-05 LAB — GC/CHLAMYDIA PROBE AMP
CT Probe RNA: NEGATIVE
GC Probe RNA: NEGATIVE

## 2013-09-08 ENCOUNTER — Ambulatory Visit (HOSPITAL_COMMUNITY)
Admission: RE | Admit: 2013-09-08 | Discharge: 2013-09-08 | Disposition: A | Discharge status: Home / Self Care | Payer: BC Managed Care – PPO | Source: Ambulatory Visit | Attending: Obstetrics and Gynecology | Admitting: Obstetrics and Gynecology

## 2013-09-08 ENCOUNTER — Encounter (HOSPITAL_COMMUNITY): Payer: Self-pay

## 2013-09-08 DIAGNOSIS — Z3689 Encounter for other specified antenatal screening: Secondary | ICD-10-CM | POA: Diagnosis not present

## 2013-09-08 DIAGNOSIS — O358XX0 Maternal care for other (suspected) fetal abnormality and damage, not applicable or unspecified: Secondary | ICD-10-CM | POA: Diagnosis not present

## 2013-09-08 DIAGNOSIS — O368390 Maternal care for abnormalities of the fetal heart rate or rhythm, unspecified trimester, not applicable or unspecified: Secondary | ICD-10-CM | POA: Diagnosis not present

## 2013-09-22 ENCOUNTER — Ambulatory Visit (HOSPITAL_COMMUNITY)
Admit: 2013-09-22 | Discharge: 2013-09-22 | Disposition: A | Discharge status: Home / Self Care | Payer: BC Managed Care – PPO | Attending: Certified Nurse Midwife | Admitting: Certified Nurse Midwife

## 2013-09-22 ENCOUNTER — Inpatient Hospital Stay (HOSPITAL_COMMUNITY): Payer: BC Managed Care – PPO

## 2013-09-22 ENCOUNTER — Encounter (HOSPITAL_COMMUNITY): Payer: Self-pay | Admitting: *Deleted

## 2013-09-22 ENCOUNTER — Inpatient Hospital Stay (HOSPITAL_COMMUNITY)
Admission: AD | Admit: 2013-09-22 | Discharge: 2013-09-24 | DRG: 782 | Disposition: A | Discharge status: Home / Self Care | Payer: BC Managed Care – PPO | Source: Ambulatory Visit | Attending: Obstetrics and Gynecology | Admitting: Obstetrics and Gynecology

## 2013-09-22 DIAGNOSIS — Z87891 Personal history of nicotine dependence: Secondary | ICD-10-CM | POA: Diagnosis not present

## 2013-09-22 DIAGNOSIS — O99119 Other diseases of the blood and blood-forming organs and certain disorders involving the immune mechanism complicating pregnancy, unspecified trimester: Secondary | ICD-10-CM

## 2013-09-22 DIAGNOSIS — D689 Coagulation defect, unspecified: Secondary | ICD-10-CM | POA: Diagnosis present

## 2013-09-22 DIAGNOSIS — D696 Thrombocytopenia, unspecified: Secondary | ICD-10-CM | POA: Diagnosis present

## 2013-09-22 DIAGNOSIS — Z823 Family history of stroke: Secondary | ICD-10-CM | POA: Diagnosis not present

## 2013-09-22 DIAGNOSIS — O283 Abnormal ultrasonic finding on antenatal screening of mother: Secondary | ICD-10-CM | POA: Diagnosis present

## 2013-09-22 DIAGNOSIS — O365990 Maternal care for other known or suspected poor fetal growth, unspecified trimester, not applicable or unspecified: Secondary | ICD-10-CM | POA: Diagnosis present

## 2013-09-22 DIAGNOSIS — IMO0002 Reserved for concepts with insufficient information to code with codable children: Secondary | ICD-10-CM | POA: Diagnosis present

## 2013-09-22 DIAGNOSIS — K219 Gastro-esophageal reflux disease without esophagitis: Secondary | ICD-10-CM | POA: Diagnosis present

## 2013-09-22 DIAGNOSIS — O4700 False labor before 37 completed weeks of gestation, unspecified trimester: Secondary | ICD-10-CM | POA: Diagnosis present

## 2013-09-22 LAB — CBC
HCT: 30.8 % — ABNORMAL LOW (ref 36.0–46.0)
Hemoglobin: 10.4 g/dL — ABNORMAL LOW (ref 12.0–15.0)
MCH: 29.5 pg (ref 26.0–34.0)
MCHC: 33.8 g/dL (ref 30.0–36.0)
MCV: 87.5 fL (ref 78.0–100.0)
PLATELETS: 114 10*3/uL — AB (ref 150–400)
RBC: 3.52 MIL/uL — ABNORMAL LOW (ref 3.87–5.11)
RDW: 14 % (ref 11.5–15.5)
WBC: 10.5 10*3/uL (ref 4.0–10.5)

## 2013-09-22 MED ORDER — ACETAMINOPHEN 325 MG PO TABS: 650.0000 mg | ORAL_TABLET | ORAL | Status: DC | PRN

## 2013-09-22 MED ORDER — PRENATAL MULTIVITAMIN CH
1.0000 | ORAL_TABLET | Freq: Every day | ORAL | Status: DC
  Administered 2013-09-23 – 2013-09-24 (×2): 1 via ORAL
  Filled 2013-09-22 (×2): qty 1

## 2013-09-22 MED ORDER — DOCUSATE SODIUM 100 MG PO CAPS
100.0000 mg | ORAL_CAPSULE | Freq: Every day | ORAL | Status: DC
  Administered 2013-09-23 – 2013-09-24 (×2): 100 mg via ORAL
  Filled 2013-09-22 (×2): qty 1

## 2013-09-22 MED ORDER — CALCIUM CARBONATE ANTACID 500 MG PO CHEW: 2.0000 | CHEWABLE_TABLET | ORAL | Status: DC | PRN

## 2013-09-22 MED ORDER — BETAMETHASONE SOD PHOS & ACET 6 (3-3) MG/ML IJ SUSP
12.0000 mg | INTRAMUSCULAR | Status: AC
  Administered 2013-09-22 – 2013-09-23 (×2): 12 mg via INTRAMUSCULAR
  Filled 2013-09-22 (×2): qty 2

## 2013-09-22 MED ORDER — ZOLPIDEM TARTRATE 5 MG PO TABS: 5.0000 mg | ORAL_TABLET | Freq: Every evening | ORAL | Status: DC | PRN

## 2013-09-22 MED ORDER — BETAMETHASONE SOD PHOS & ACET 6 (3-3) MG/ML IJ SUSP: 12.0000 mg | INTRAMUSCULAR | Status: DC

## 2013-09-22 NOTE — Progress Notes (Signed)
Addendum  Report and recommendation from MFM 1) repeat dopplers in 48 hours (Sunday) 2) if reassuring manage as an outpatient 3) weekly dopplers/BPP 4) [place] for growth next week 5) [place] for growth q 3 weeks  Complete BMZ course NST q shift Continue care as ordered

## 2013-09-22 NOTE — Consult Note (Signed)
Maternal Fetal Medicine Consultation  Requesting Provider(s): Jaymes Graff, MD  Reason for consultation: Elevated UA Doppler studies, suspected fetal growth restriction  HPI: [name] [name] is a 28 yo G1P0 currently at 28w 1d by LMP who was seen for consultation due to elevated UA Doppler studies and suspected fetal growth restriction.  [name] [name] was last seen by MFM on 8/28.  At that time, the estimated fetal weight was at the 18th %tile with the [facility] measuring at the 3rd %tile.  UA Doppler studies at that time were elevated (94th %tile) without AEDF or REDF.  Since that time, she has been undergoing weekly UA Doppler studies.  Clinic ultrasound earlier today showed elevated UA Doppler studies with a BPP of 8/8.  The patient is now admitted for a course of betamethasone and observation.  She is otherwise without complaints.  The fetus is active.  Prenatal course has been complicated by a possible fetal arrhythmia- on initial evaluation, this was felt to be consistent with premature atrial contractions which have since resolved.  Additionally, patient had an elevated, unexplained MSAFP.  She is without complaints today.  OB History: OB History   Grav Para Term Preterm Abortions TAB SAB Ect Mult Living   1               PMH:  Past Medical History  Diagnosis Date  . GERD (gastroesophageal reflux disease)     OTC as needed  . Laceration of right thumb 08/16/2011    right thumb digital nerve lac.; sutures x 8, per pt.  . Irregular menses     states only 2 periods/year  . Gonorrhea   . Trichomonas vaginitis     PSH:  Past Surgical History  Procedure Laterality Date  . No past surgeries    . Nerve repair  08/31/2011    Procedure: NERVE REPAIR;  Surgeon: Tami Ribas, MD;  Location: [facility];  Service: Orthopedics;  Laterality: Right;  right thumb digital nerve repair  . Wisdom tooth extraction     Meds:  No current facility-administered medications on file prior to  encounter.   Current Outpatient Prescriptions on File Prior to Encounter  Medication Sig Dispense Refill  . Multiple Vitamins-Minerals (MULTIVITAMIN GUMMIES ADULT PO) Take 2 tablets by mouth daily.      . Prenatal Vit-Fe Fumarate-FA (PRENATAL MULTIVITAMIN) TABS tablet Take 1 tablet by mouth daily at 12 noon.      Marland Kitchen terconazole (TERAZOL 7) 0.4 % vaginal cream Place 1 applicator vaginally at bedtime.  45 g  0   Allergies:  Allergies  Allergen Reactions  . Sulfonamide Derivatives Hives   FH:  Family History  Problem Relation Age of Onset  . Cancer Maternal Grandfather     lung  . Stroke Maternal Grandfather   . Hearing loss Neg Hx    Soc:  History   Social History  . Marital Status: Married    Spouse Name: N/A    Number of Children: N/A  . Years of Education: N/A   Occupational History  . Not on file.   Social History Main Topics  . Smoking status: Former Smoker -- 4 years    Types: Cigarettes    Quit date: 05/05/2013  . Smokeless tobacco: Never Used     Comment: quit with pos preg  . Alcohol Use: No  . Drug Use: No  . Sexual Activity: Yes    Birth Control/ Protection: None   Other Topics Concern  .  Not on file   Social History Narrative  . No narrative on file   PE:   Filed Vitals:   09/22/13 1349  BP: 113/61  Pulse: 71  Temp: 97.9 F (36.6 C)  Resp: 18    GEN: well-appearing female ABD: gravid, NT  Ultrasound: Single IUP at [redacted]w[redacted]d Lagging growth (EFW at the 18th %tile with AC < 3rd %tile on 8/28) Active fetus with BPP of 8/8 UA Doppler studies elevated - no AEDF or REDF noted Normal amniotic fluid volume   A/P: 1) Single IUP at 28w 1d         2) Lagging growth - The patient underwent an ultrasound at 12 weeks which showed a 6 day dating discrepancy.  The new ACOG guideline for dating requires a 7 day dating discrepancy at 12 weeks.  Based on the new ACOG guidelines, would not adjust EDC.  Given concerns regarding the patients lagging growth and  elevated UA Doppler studies, feel that it would be reasonable to complete a course of betamethasone as an inpatient.  If the fetal tracings remains reassuring, would consider repeat UA Dopplers on in 48 hours.  If stable, would be comfortable managing the patient as an outpatient.  Would continue weekly UA Doppler studies with BPPs and follow up ultrasound for growth next week. She will need growth ultrasounds at least every 3 weeks thereafter.  If desired, we would be happy to perform her weekly testing and follow up growth studies in the [facility].  Thank you for the opportunity to be a part of the care of [name] [name]. Please contact our office if we can be of further assistance.   I spent approximately 30 minutes with this patient with over 50% of time spent in face-to-face counseling.  Alpha Gula, MD Maternal Fetal Medicine

## 2013-09-22 NOTE — H&P (Signed)
[name] [name] is a 28 y.o. female, G1 P0 at 28.1 weeks  Patient Active Problem List   Diagnosis Date Noted  . IUGR (intrauterine growth restriction) 09/22/2013  . 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 GONORRHEA 02/17/2010  . TOBACCO ABUSE 11/05/2009  . SHOULDER PAIN, RIGHT 11/05/2009  . ANKLE SPRAIN, RIGHT 11/05/2009  . ALLERGIC RHINITIS 09/17/2009  . GERD 09/17/2009  . CHEST PAIN UNSPECIFIED 09/17/2009    Pregnancy Course: Patient entered care at 9.6 weeks EDC of 12/14/13 was established by EDD.      [place] evaluations:  12.6 weeks - Dating:  IUP w/GA 12.0 weeks, FHR 154, anteverted uterus, amnion seen normal   fluid, cervix closed 19.1 weeks - Anatomy: EFW 10oz - 32%tile, S,D by 6 days, FHR 137, irregular FHR, possible   heart block or premature atrial contraction, LEIF, cervical length 3.12, breech,   anterior placenta, no previa, edge 4.8cm, female. 22.1 weeks - FU: S<D at 20.5 weeks, EFW 13oz - 19%tile, AC 10%tile, breech, FHR 133 fluid   normal, cervical length 3cm, left urinary tract dilation, normal rhythm, interval growth   in 4-6 weeks,  26.1 weeks - FU: S<D at 24.1 weeks, EFW 1lb 8oz - 18%tile, AC 3rd % tile, FHR 147, breech,   anterior placenta fluid normal, PAC resolved, dopplers 92% tile 28.0 weeks - FU: S<D at 26.5 days, EFW 2lbs 0oz - 3%tile, cervical length 4.45, AFI 13.44, FHR   144, vertex, EIFLV, vertex,  28.1 weeks - FHR 140, vertex, BPP 8/8, UADn mildly elevated above 50% with S/D 3.6. No absent   or reversed flow  Significant prenatal events:   Growth lag,  Failed 1hour gtt, 3 hour gtt pending Last evaluation:   27.3 weeks    Reason for admission:  Observation, MFM consult and BMZ course   OB History   Grav Para Term Preterm Abortions TAB SAB Ect Mult Living   1              Past Medical History  Diagnosis Date  . GERD (gastroesophageal reflux disease)     OTC as needed  . Laceration of right thumb 08/16/2011    right thumb digital nerve lac.; sutures x 8, per pt.   . Irregular menses     states only 2 periods/year  . Gonorrhea   . Trichomonas vaginitis    Past Surgical History  Procedure Laterality Date  . No past surgeries    . Nerve repair  08/31/2011    Procedure: NERVE REPAIR;  Surgeon: Tami Ribas, MD;  Location: [facility];  Service: Orthopedics;  Laterality: Right;  right thumb digital nerve repair  . Wisdom tooth extraction     Family History: family history includes Cancer in her maternal grandfather; Stroke in her maternal grandfather. There is no history of Hearing loss. Social History:  reports that she quit smoking about 4 months ago. Her smoking use included Cigarettes. She smoked 0.00 packs per day for 4 years. She has never used smokeless tobacco. She reports that she does not drink alcohol or use illicit drugs.   Prenatal Transfer Tool  Maternal Diabetes: Failed 1 hour, 3 hour scheduled Genetic Screening: Abnormal:  Results: Other:idiopathic AFP elevation Maternal Ultrasounds/Referrals: Abnormal:  Findings:   Cardiac defect, possible Fetal Ultrasounds or other Referrals:  Referred to Materal Fetal Medicine  Maternal Substance Abuse:  No Significant Maternal Medications:  None Significant Maternal Lab Results: None   ROS:  See HPI above, all other systems are negative  Allergies  Allergen Reactions  . Sulfonamide Derivatives Hives    Blood pressure 113/61, pulse 71, temperature 97.9 F (36.6 C), temperature source Oral, resp. rate 18, height  (1.575 m), weight 75.751 kg (167 lb), last menstrual period 03/09/2013.  Maternal Exam:  Uterine Assessment: No Contraction Abdomen: Gravid, non tender. Fundal height is sga.  Normal external genitalia, vulva, cervix, uterus and adnexa.  No lesions noted on exam.  Pelvis adequate for delivery.  Fetal presentation: Vertex by [place] on 09/22/13  Fetal Exam:  Monitor Surveillance : Continuous Monitoring   Reassuring CTXs: none EFW   2 lbs per 28.0 [place]  Physical  Exam: Nursing note and vitals reviewed General: alert and cooperative She appears well nourished.  Psychiatric: Normal mood and affect. Her behavior is normal.  Head: Normocephalic.  Eyes: Pupils are equal, round, and reactive to light.  Neck: Normal range of motion.  Cardiovascular: RRR without murmur.  Respiratory: CTAB. Effort normal.  Abd: soft, non-tender, +BS, no rebound, no guarding  Genitourinary: Vagina normal.  Musculoskeletal: Normal range of motion.  Ext: Homan's sign negative bilaterally. No evidence of DVTs.  DTR 2+, no clonus Neurological: A&Ox3.  Skin: Warm and dry.   Prenatal labs: ABO, Rh:  A pos Antibody:  neg Rubella:   immune RPR:   NR HBsAg:   neg HIV:   NR GBS:  n/a Sickle cell/Hgb electrophoresis:  WNL Pap:   GC:  Neg  Chlamydia: neg Genetic screenings:  wnl Glucola:  1 hour gtt /dL on a range of 16-109 on 09/21/13, 3 hour gtt scheduled  Assessment:  IUP at 28.1 weeks FHT 135, moderate variability, reassuring Membranes: intact GBS n/a Diagnosis: IUGR  Plan:  Admit to Antepartum unit Routing [facility] AP orders MFM consult BMZ course Diet: reg  Tabias Swayze, CNM, MSN 09/22/2013, 2:51 PM

## 2013-09-23 NOTE — Progress Notes (Addendum)
Hospital day # 1 pregnancy at [redacted]w[redacted]d--IUGR, abnormal dopplers.  S:  Doing well.  Partner at bedside, supportive.  Baby very active.      Perception of contractions: None      Vaginal bleeding: None       Vaginal discharge: None  O: BP 120/58  Pulse 79  Temp(Src) 98.3 F (36.8 C) (Oral)  Resp 20  Ht  (1.575 m)  Wt 167 lb (75.751 kg)  BMI 30.54 kg/m2  LMP 03/09/2013      Fetal tracings:  Category 1 on q shift tracing.  Several areas of drop-out in          early part of tracing, very occasional mild variable.      Contractions:   Mild irritability      Uterus non-tender      Extremities: no significant edema and no signs of DVT          Labs:   Results for orders placed during the hospital encounter of 09/22/13 (from the past 24 hour(s))  CBC     Status: Abnormal   Collection Time    09/22/13  6:45 PM      Result Value Ref Range   WBC 10.5  4.0 - 10.5 K/uL   RBC 3.52 (*) 3.87 - 5.11 MIL/uL   Hemoglobin 10.4 (*) 12.0 - 15.0 g/dL   HCT 16.1 (*) 09.6 - 04.5 %   MCV 87.5  78.0 - 100.0 fL   MCH 29.5  26.0 - 34.0 pg   MCHC 33.8  30.0 - 36.0 g/dL   RDW 40.9  81.1 - 91.4 %   Platelets 114 (*) 150 - 400 K/uL        Meds:   . betamethasone acetate-betamethasone sodium phosphate  12 mg Intramuscular Q24 Hr x 2  . docusate sodium  100 mg Oral Daily  . prenatal multivitamin  1 tablet Oral Q1200  Received 1st dose betamethasone at 1628 09/22/13  A: [redacted]w[redacted]d with IUGR, abnormal dopplers     Stable  P: Continue current plan of care      Upcoming tests/treatments:  2nd dose betamethasone today at 1628      Repeat dopplers tomorrow afternoon around 5pm      NICU consult      Support to patient and partner regarding dx, discussed questions       regarding IUGR, dopplers, and plan of care.      MDs will follow  Nigel Bridgeman CNM, [place] 09/23/2013 11:32 AM   Pt doing well Pt with probable gestional thrombocytopenia.  Recheck CBC in am and CMET Continue care

## 2013-09-23 NOTE — Plan of Care (Signed)
Problem: Consults Goal: Birthing Suites Patient Information Press F2 to bring up selections list  Outcome: Not Applicable Date Met:  17/98/10  Patient antenatal patient not a candidate fir this admission Goal: Lactation Consult Initiated if indicated Outcome: Progressing Lactation consult to be notified in AM

## 2013-09-24 ENCOUNTER — Inpatient Hospital Stay (HOSPITAL_COMMUNITY): Payer: BC Managed Care – PPO

## 2013-09-24 DIAGNOSIS — D696 Thrombocytopenia, unspecified: Secondary | ICD-10-CM | POA: Diagnosis present

## 2013-09-24 DIAGNOSIS — O283 Abnormal ultrasonic finding on antenatal screening of mother: Secondary | ICD-10-CM | POA: Diagnosis present

## 2013-09-24 LAB — COMPREHENSIVE METABOLIC PANEL
ALBUMIN: 3.1 g/dL — AB (ref 3.5–5.2)
ALK PHOS: 67 U/L (ref 39–117)
ALT: 13 U/L (ref 0–35)
AST: 14 U/L (ref 0–37)
Anion gap: 13 (ref 5–15)
BUN: 7 mg/dL (ref 6–23)
CHLORIDE: 103 meq/L (ref 96–112)
CO2: 21 mEq/L (ref 19–32)
CREATININE: 0.57 mg/dL (ref 0.50–1.10)
Calcium: 8.9 mg/dL (ref 8.4–10.5)
GFR calc Af Amer: >90 mL/min (ref >90)
GFR calc non Af Amer: >90 mL/min (ref >90)
Glucose, Bld: 99 mg/dL (ref 70–99)
POTASSIUM: 3.9 meq/L (ref 3.7–5.3)
Sodium: 137 mEq/L (ref 137–147)
Total Bilirubin: <0.2 mg/dL — ABNORMAL LOW (ref 0.3–1.2)
Total Protein: 6.7 g/dL (ref 6.0–8.3)

## 2013-09-24 LAB — CBC WITH DIFFERENTIAL/PLATELET
BASOS ABS: 0 10*3/uL (ref 0.0–0.1)
BASOS PCT: 0 % (ref 0–1)
Eosinophils Absolute: 0 10*3/uL (ref 0.0–0.7)
Eosinophils Relative: 0 % (ref 0–5)
HCT: 32.3 % — ABNORMAL LOW (ref 36.0–46.0)
Hemoglobin: 11 g/dL — ABNORMAL LOW (ref 12.0–15.0)
Lymphocytes Relative: 12 % (ref 12–46)
Lymphs Abs: 2 10*3/uL (ref 0.7–4.0)
MCH: 29.8 pg (ref 26.0–34.0)
MCHC: 34.1 g/dL (ref 30.0–36.0)
MCV: 87.5 fL (ref 78.0–100.0)
Monocytes Absolute: 0.5 10*3/uL (ref 0.1–1.0)
Monocytes Relative: 3 % (ref 3–12)
NEUTROS ABS: 13.9 10*3/uL — AB (ref 1.7–7.7)
Neutrophils Relative %: 84 % — ABNORMAL HIGH (ref 43–77)
PLATELETS: 109 10*3/uL — AB (ref 150–400)
RBC: 3.69 MIL/uL — ABNORMAL LOW (ref 3.87–5.11)
RDW: 13.9 % (ref 11.5–15.5)
WBC: 16.4 10*3/uL — ABNORMAL HIGH (ref 4.0–10.5)

## 2013-09-24 NOTE — Progress Notes (Signed)
Hospital day # 2 pregnancy at [redacted]w[redacted]d--IUGR, abnormal dopplers.  S:  Awaiting repeat dopplers this afternoon around 5p.  No c/o.      Perception of contractions: None      Vaginal bleeding: None       Vaginal discharge:  None  O: BP 118/61  Pulse 73  Temp(Src) 98.3 F (36.8 C) (Oral)  Resp 18  Ht  (1.575 m)  Wt 167 lb (75.751 kg)  BMI 30.54 kg/m2  SpO2 100%  LMP 03/09/2013      Fetal tracings:  Category 1      Contractions:   Mild irritability      Uterus non-tender      Extremities: no significant edema and no signs of DVT  Completed betamethasone course yesterday 1550.  Last growth [place] 9/0/15 at office:  EFW 915 gm, 2 lbs, 3.3%ile, AFI 13.44 LVEIF noted.  Has appt scheduled at office on 9/17 for ROB and f/u weekly doppler          Labs:   Results for orders placed during the hospital encounter of 09/22/13 (from the past 24 hour(s))  CBC WITH DIFFERENTIAL     Status: Abnormal   Collection Time    09/24/13  5:25 AM      Result Value Ref Range   WBC 16.4 (*) 4.0 - 10.5 K/uL   RBC 3.69 (*) 3.87 - 5.11 MIL/uL   Hemoglobin 11.0 (*) 12.0 - 15.0 g/dL   HCT 16.1 (*) 09.6 - 04.5 %   MCV 87.5  78.0 - 100.0 fL   MCH 29.8  26.0 - 34.0 pg   MCHC 34.1  30.0 - 36.0 g/dL   RDW 40.9  81.1 - 91.4 %   Platelets 109 (*) 150 - 400 K/uL   Neutrophils Relative % 84 (*) 43 - 77 %   Neutro Abs 13.9 (*) 1.7 - 7.7 K/uL   Lymphocytes Relative 12  12 - 46 %   Lymphs Abs 2.0  0.7 - 4.0 K/uL   Monocytes Relative 3  3 - 12 %   Monocytes Absolute 0.5  0.1 - 1.0 K/uL   Eosinophils Relative 0  0 - 5 %   Eosinophils Absolute 0.0  0.0 - 0.7 K/uL   Basophils Relative 0  0 - 1 %   Basophils Absolute 0.0  0.0 - 0.1 K/uL  COMPREHENSIVE METABOLIC PANEL     Status: Abnormal   Collection Time    09/24/13  5:25 AM      Result Value Ref Range   Sodium 137  137 - 147 mEq/L   Potassium 3.9  3.7 - 5.3 mEq/L   Chloride 103  96 - 112 mEq/L   CO2 21  19 - 32 mEq/L   Glucose, Bld 99  70 - 99 mg/dL   BUN  7  6 - 23 mg/dL   Creatinine, Ser 7.82  0.50 - 1.10 mg/dL   Calcium 8.9  8.4 - 95.6 mg/dL   Total Protein 6.7  6.0 - 8.3 g/dL   Albumin 3.1 (*) 3.5 - 5.2 g/dL   AST 14  0 - 37 U/L   ALT 13  0 - 35 U/L   Alkaline Phosphatase 67  39 - 117 U/L   Total Bilirubin <0.2 (*) 0.3 - 1.2 mg/dL   GFR calc non Af Amer >90  >90 mL/min   GFR calc Af Amer >90  >90 mL/min   Anion gap 13  5 - 15  Meds:  . docusate sodium  100 mg Oral Daily  . prenatal multivitamin  1 tablet Oral Q1200     A: [redacted]w[redacted]d with IUGR, abnormal dopplers     Gestational thrombocytopenia     Stable  P: Continue current plan of care      Upcoming tests/treatments:  Repeat dopplers today at 5pm      MDs will follow--will consult after dopplers done.  Nigel Bridgeman CNM, [place] 09/24/2013 10:37 AM

## 2013-09-24 NOTE — Progress Notes (Signed)
Pt d/c'd home with belongings--d/c instructions given and explained--pt states understanding and all questions answered

## 2013-09-24 NOTE — Discharge Instructions (Signed)
Intrauterine Growth Restriction Intrauterine growth restriction (IUGR) means that the baby is smaller than normal at the time of the pregnancy or at birth. This should not be confused with Small for Gestational Age (SGA), which means the baby's weight at birth is at the lower end (less than 10%) of normal birth weights.  CAUSES  Medical problems with the mother:  High blood pressure.  Kidney, lung or heart disease.  Diabetes with arteriosclerosis.  Hemoglobinopathies- blood diseases.  Antiphospholipid antibody syndrome - a disorder of the immune system. Other causes:  Smoking, drug abuse and excessive alcohol drinking.  Diseases of the placenta.  Having twins or more.  Malnutrition.  Infections.  Genetic problems.  Pregnant women 16 years old or younger and pregnant women 35 years old or older.  Exposure to toxic chemicals. SYMPTOMS  Smaller than normal uterus when measuring the uterine size on the abdomen.  Ultrasound measurements of the fetuses head circumference, the abdominal circumference, the diameter of the biparietal area (sides) of the head and the length of the femur less than normal indicating IUGR. RISKS AND COMPLICATIONS  Fetal death in the uterus or a stillborn baby.  Not having enough fluid in the baby's sac (oligohydramnios).  Fetal heart rate problems. This leads to more Cesarean Section deliveries.  Low Apgar scores (evaluates the baby's condition at birth).  Increase in the acidity of the baby's blood (acidosis). SGA babies can also have complications such as:  Low blood sugar.  Increase of bilirubin in the blood.  Low body temperature (hypothermia)  Low Apgar scores, convulsions, fetal death and stillborn. TREATMENT   Close following and monitoring of the fetus during the pregnancy.  Treat infections that may be present.  Treat and control the medical disease present.  Look at the condition of the fetus with non-tress tests,  contraction stress tests and biophysical profile of the fetus.  Doppler ultrasound (measure the umbilical artery blood flow) lowers the risk of fetal death and stillborn by delivering the baby early if it is abnormal. HOME CARE INSTRUCTIONS   Follow your caregiver's advice, instructions and keep all of your prenatal appointments.  Get plenty of rest and sleep.  Eat a balanced diet and take all your vitamin and mineral supplements.  Do not over use your energy with hard exercise, work and household activities.  Do not exercise unless your caregiver says it is OK to do so.  Do not smoke, drink alcohol or take illegal drugs.  Avoid chemicals like pesticides. SEEK MEDICAL CARE IF:   You develop a temperature of 100 F (37.8 C) or higher.  You do not feel the baby moving as much or not at all.  You develop leaking of fluid from the vagina.  You develop vaginal bleeding.  You develop abdominal pain.  You develop uterine contractions. Document Released: 10/08/2007 Document Revised: 05/15/2013 Document Reviewed: 10/08/2007 ExitCare Patient Information 2015 [employer]. This information is not intended to replace advice given to you by your health care provider. Make sure you discuss any questions you have with your health care provider.  

## 2013-09-24 NOTE — Discharge Summary (Signed)
Physician Discharge Summary  Patient ID: [name] [name] MRN: [id number] DOB/AGE: January 09, 1986 27 y.o.  Admit date: 09/22/2013 Discharge date: 09/24/2013  Admission Diagnoses:  IUP at 28 1/7 weeks, IUGR, abnormal dopplers, LVEIF, abnormal AFP  Discharge Diagnoses:  Principal Problem:   IUGR (intrauterine growth restriction) Active Problems:   Thrombocytopenia, unspecified--platelets 109 09/24/13   Echogenic intracardiac focus of fetus on prenatal ultrasound   Discharged Condition: stable  Hospital Course: Admitted 09/22/13 from the office s/p US showing persistence of IUGR and newly elevated dopplers.  Consultation with Dr. Whitecar--recommended betamethasone and observation.  FHR remained reassuring on q shift tracings.  Patient received betamethasone on 9/11 and 9/12.  Repeat dopplers were done at 48 hours from previous studies, with no worsening of results.  Platelet count was noted to be 114 on admission, with 109 on repeat 09/24/13. CMP was WNL, and she was normotensive.  Per consult with Dr. Estanislado Pandy (and based on MFM recommendation), she was d/c'd home.  D/C Plan: Repeat dopplers and BPP weekly--already scheduled 9/17 Repeat CBC for platelet assessment at 9/17 visit--if < 100, will refer to hematology.  Monitor levels, with plan for definite repeat at 36 weeks. Plan growth [place] q 2 weeks--next due 10/08/13. FKCs reviewed On rest at home.   Consults: MFM  Significant Diagnostic Studies: labs:  Results for orders placed during the hospital encounter of 09/22/13 (from the past 24 hour(s))  CBC WITH DIFFERENTIAL     Status: Abnormal   Collection Time    09/24/13  5:25 AM      Result Value Ref Range   WBC 16.4 (*) 4.0 - 10.5 K/uL   RBC 3.69 (*) 3.87 - 5.11 MIL/uL   Hemoglobin 11.0 (*) 12.0 - 15.0 g/dL   HCT 40.9 (*) 81.1 - 91.4 %   MCV 87.5  78.0 - 100.0 fL   MCH 29.8  26.0 - 34.0 pg   MCHC 34.1  30.0 - 36.0 g/dL   RDW 78.2  95.6 - 21.3 %   Platelets 109 (*) 150 - 400 K/uL    Neutrophils Relative % 84 (*) 43 - 77 %   Neutro Abs 13.9 (*) 1.7 - 7.7 K/uL   Lymphocytes Relative 12  12 - 46 %   Lymphs Abs 2.0  0.7 - 4.0 K/uL   Monocytes Relative 3  3 - 12 %   Monocytes Absolute 0.5  0.1 - 1.0 K/uL   Eosinophils Relative 0  0 - 5 %   Eosinophils Absolute 0.0  0.0 - 0.7 K/uL   Basophils Relative 0  0 - 1 %   Basophils Absolute 0.0  0.0 - 0.1 K/uL  COMPREHENSIVE METABOLIC PANEL     Status: Abnormal   Collection Time    09/24/13  5:25 AM      Result Value Ref Range   Sodium 137  137 - 147 mEq/L   Potassium 3.9  3.7 - 5.3 mEq/L   Chloride 103  96 - 112 mEq/L   CO2 21  19 - 32 mEq/L   Glucose, Bld 99  70 - 99 mg/dL   BUN 7  6 - 23 mg/dL   Creatinine, Ser 0.86  0.50 - 1.10 mg/dL   Calcium 8.9  8.4 - 57.8 mg/dL   Total Protein 6.7  6.0 - 8.3 g/dL   Albumin 3.1 (*) 3.5 - 5.2 g/dL   AST 14  0 - 37 U/L   ALT 13  0 - 35 U/L   Alkaline Phosphatase 67  39 - 117 U/L   Total Bilirubin <0.2 (*) 0.3 - 1.2 mg/dL   GFR calc non Af Amer >90  >90 mL/min   GFR calc Af Amer >90  >90 mL/min   Anion gap 13  5 - 15    and Doppler studies:  09/24/13--Vtx, normal AFI, doppler at 96%ile.  Treatments: IV hydration and Betamethasone course  Discharge Exam: Blood pressure 120/61, pulse 76, temperature 97.8 F (36.6 C), temperature source Oral, resp. rate 18, height  (1.575 m), weight 167 lb (75.751 kg), last menstrual period 03/09/2013, SpO2 100.00%. General appearance: alert Cardio: regular rate and rhythm, S1, S2 normal, no murmur, click, rub or gallop Pelvic: Uterus 26 week size, NT Extremities: extremities normal, atraumatic, no cyanosis or edema  Disposition: 01-Home or Self Care     Medication List         MULTIVITAMIN GUMMIES ADULT PO  Take 2 tablets by mouth daily.     prenatal multivitamin Tabs tablet  Take 1 tablet by mouth daily at 12 noon.     terconazole 0.4 % vaginal cream  Commonly known as:  TERAZOL 7  Place 1 applicator vaginally at bedtime.            Follow-up Information   Follow up with [facility] On 09/28/2013. (Keep scheduled appt, call for any questions or concerns.)    Specialty:  Obstetrics and Gynecology   Contact information:   [address] [place] [address] [phone]      Signed: Nigel Bridgeman 09/24/2013, 6:26 PM

## 2013-09-24 NOTE — Plan of Care (Signed)
Problem: Phase I Progression Outcomes Goal: Diabetic Assessment Outcome: Not Applicable Date Met:  72/53/66 Pt. Is not a Diabetic.

## 2013-10-05 ENCOUNTER — Other Ambulatory Visit (HOSPITAL_COMMUNITY): Payer: Self-pay | Admitting: Obstetrics and Gynecology

## 2013-10-05 DIAGNOSIS — O3500X1 Maternal care for (suspected) central nervous system malformation or damage in fetus, unspecified, fetus 1: Secondary | ICD-10-CM

## 2013-10-05 DIAGNOSIS — O368390 Maternal care for abnormalities of the fetal heart rate or rhythm, unspecified trimester, not applicable or unspecified: Secondary | ICD-10-CM

## 2013-10-05 DIAGNOSIS — O365991 Maternal care for other known or suspected poor fetal growth, unspecified trimester, fetus 1: Secondary | ICD-10-CM

## 2013-10-05 DIAGNOSIS — O358XX0 Maternal care for other (suspected) fetal abnormality and damage, not applicable or unspecified: Secondary | ICD-10-CM

## 2013-10-05 DIAGNOSIS — O350XX1 Maternal care for (suspected) central nervous system malformation in fetus, fetus 1: Secondary | ICD-10-CM

## 2013-10-05 DIAGNOSIS — N2889 Other specified disorders of kidney and ureter: Secondary | ICD-10-CM

## 2013-10-06 ENCOUNTER — Ambulatory Visit (HOSPITAL_COMMUNITY)
Admission: RE | Admit: 2013-10-06 | Discharge: 2013-10-06 | Disposition: A | Discharge status: Home / Self Care | Payer: BC Managed Care – PPO | Source: Ambulatory Visit | Attending: Obstetrics and Gynecology | Admitting: Obstetrics and Gynecology

## 2013-10-06 VITALS — BP 126/70 | HR 77 | Wt 163.0 lb

## 2013-10-06 DIAGNOSIS — O358XX0 Maternal care for other (suspected) fetal abnormality and damage, not applicable or unspecified: Secondary | ICD-10-CM

## 2013-10-06 DIAGNOSIS — K219 Gastro-esophageal reflux disease without esophagitis: Secondary | ICD-10-CM | POA: Diagnosis not present

## 2013-10-06 DIAGNOSIS — IMO0002 Reserved for concepts with insufficient information to code with codable children: Secondary | ICD-10-CM

## 2013-10-06 DIAGNOSIS — O350XX0 Maternal care for (suspected) central nervous system malformation in fetus, not applicable or unspecified: Secondary | ICD-10-CM | POA: Insufficient documentation

## 2013-10-06 DIAGNOSIS — O368390 Maternal care for abnormalities of the fetal heart rate or rhythm, unspecified trimester, not applicable or unspecified: Secondary | ICD-10-CM

## 2013-10-06 DIAGNOSIS — O365990 Maternal care for other known or suspected poor fetal growth, unspecified trimester, not applicable or unspecified: Secondary | ICD-10-CM | POA: Insufficient documentation

## 2013-10-06 DIAGNOSIS — O365991 Maternal care for other known or suspected poor fetal growth, unspecified trimester, fetus 1: Secondary | ICD-10-CM

## 2013-10-06 DIAGNOSIS — O350XX1 Maternal care for (suspected) central nervous system malformation in fetus, fetus 1: Secondary | ICD-10-CM

## 2013-10-06 DIAGNOSIS — Z87891 Personal history of nicotine dependence: Secondary | ICD-10-CM | POA: Insufficient documentation

## 2013-10-06 DIAGNOSIS — O3500X0 Maternal care for (suspected) central nervous system malformation or damage in fetus, unspecified, not applicable or unspecified: Secondary | ICD-10-CM | POA: Insufficient documentation

## 2013-10-06 DIAGNOSIS — N2889 Other specified disorders of kidney and ureter: Secondary | ICD-10-CM

## 2013-10-06 DIAGNOSIS — O3500X1 Maternal care for (suspected) central nervous system malformation or damage in fetus, unspecified, fetus 1: Secondary | ICD-10-CM

## 2013-10-06 NOTE — Progress Notes (Signed)
MATERNAL FETAL MEDICINE CONSULT  Patient Name: [name] [name] Medical Record Number:  [id number] Date of Birth: 1985/03/30 Requesting Physician Name:  Purcell Nails, MD Date of Service: 10/06/2013  Chief Complaint Fetal growth restriction  History of Present Illness [name] was seen today secondary to fetal growth restriction at the request of Purcell Nails, MD.  The patient is a 28 y.o. G1P0,at [redacted]w[redacted]d with an EDD of 12/14/2013, by Last Menstrual Period dating method.  [name] was seen for a consult with Dr. Claudean Severance earlier in pregnancy for further details please see his consult note.  [name] returns today as an ultrasound performed in her primary OB's office demonstrated elevated an umbilical artery S/D ratio.  They are requesting recommendations regarding fetal testing and delivery timing.  She has no complaints at this time.  Review of Systems Pertinent items are noted in HPI.  Patient History OB History  Gravida Para Term Preterm AB SAB TAB Ectopic Multiple Living  1             # Outcome Date GA Lbr Len/2nd Weight Sex Delivery Anes PTL Lv  1 CUR               Past Medical History  Diagnosis Date  . GERD (gastroesophageal reflux disease)     OTC as needed  . Laceration of right thumb 08/16/2011    right thumb digital nerve lac.; sutures x 8, per pt.  . Irregular menses     states only 2 periods/year  . Gonorrhea   . Trichomonas vaginitis     Past Surgical History  Procedure Laterality Date  . No past surgeries    . Nerve repair  08/31/2011    Procedure: NERVE REPAIR;  Surgeon: Tami Ribas, MD;  Location: [facility];  Service: Orthopedics;  Laterality: Right;  right thumb digital nerve repair  . Wisdom tooth extraction      History   Social History  . Marital Status: Married    Spouse Name: N/A    Number of Children: N/A  . Years of Education: N/A   Social History Main Topics  . Smoking status: Former Smoker -- 4 years     Types: Cigarettes    Quit date: 05/05/2013  . Smokeless tobacco: Never Used     Comment: quit with pos preg  . Alcohol Use: No  . Drug Use: No  . Sexual Activity: Yes    Birth Control/ Protection: None   Other Topics Concern  . Not on file   Social History Narrative  . No narrative on file    Family History  Problem Relation Age of Onset  . Cancer Maternal Grandfather     lung  . Stroke Maternal Grandfather   . Hearing loss Neg Hx     Physical Examination There were no vitals filed for this visit. General appearance - alert, well appearing, and in no distress Abdomen - soft, nontender, nondistended, no masses or organomegaly Extremities - no pedal edema noted  Assessment and Recommendations 1.  Fetal growth restriction.  Fetal growth today again demonstrates less than expected fetal growth.  Although the elevated umbilical artery S/D ratio is also seen on ultrasound today at the [facility] the BPP was 8/8.  Thus, while there is evidence of placental dysfunction there is no evidence of metabolic compromise of the fetus at this time allowing the pregnancy to safely continue.  However, close fetal monitoring is clearly needed.  I  recommend once weekly BPPs and umbilical artery Dopplers until 32 weeks and then twice weekly testing with weekly umbilical artery Dopplers thereafter.  If testing remains reassuring I recommend delivery at 37 weeks.  If the fetal testing does not deteriorate prior to 37 weeks a vaginal delivery can be attempted.  I would reserve cesarean delivery for standard obstetrical indications.  We would be happy to perform [name]'s fetal testing if desired.  If so, please call to schedule.  I spent 20 minutes with Ms. PAIN today of which 50% was face-to-face counseling.  Thank you for referring [name] to the [facility].  Please do not hesitate to contact us with questions.   Rema Fendt, MD

## 2013-11-13 ENCOUNTER — Encounter (HOSPITAL_COMMUNITY): Payer: Self-pay | Admitting: *Deleted

## 2013-11-16 LAB — OB RESULTS CONSOLE GBS: GBS: POSITIVE

## 2013-11-22 ENCOUNTER — Encounter (HOSPITAL_COMMUNITY): Payer: Self-pay | Admitting: *Deleted

## 2013-11-22 ENCOUNTER — Telehealth (HOSPITAL_COMMUNITY): Payer: Self-pay | Admitting: *Deleted

## 2013-11-22 NOTE — Telephone Encounter (Signed)
Preadmission screen  

## 2013-11-23 ENCOUNTER — Inpatient Hospital Stay (HOSPITAL_COMMUNITY)
Admission: AD | Admit: 2013-11-23 | Discharge: 2013-11-27 | DRG: 775 | Disposition: A | Discharge status: Home / Self Care | Payer: Medicaid Other | Source: Ambulatory Visit | Attending: Obstetrics and Gynecology | Admitting: Obstetrics and Gynecology

## 2013-11-23 ENCOUNTER — Encounter (HOSPITAL_COMMUNITY): Payer: Self-pay

## 2013-11-23 ENCOUNTER — Inpatient Hospital Stay (HOSPITAL_COMMUNITY): Admission: RE | Admit: 2013-11-23 | Payer: BC Managed Care – PPO | Source: Ambulatory Visit

## 2013-11-23 DIAGNOSIS — D696 Thrombocytopenia, unspecified: Secondary | ICD-10-CM | POA: Diagnosis present

## 2013-11-23 DIAGNOSIS — O9912 Other diseases of the blood and blood-forming organs and certain disorders involving the immune mechanism complicating childbirth: Principal | ICD-10-CM | POA: Diagnosis present

## 2013-11-23 DIAGNOSIS — Z3A37 37 weeks gestation of pregnancy: Secondary | ICD-10-CM | POA: Diagnosis present

## 2013-11-23 DIAGNOSIS — O99824 Streptococcus B carrier state complicating childbirth: Secondary | ICD-10-CM | POA: Diagnosis present

## 2013-11-23 DIAGNOSIS — O280 Abnormal hematological finding on antenatal screening of mother: Secondary | ICD-10-CM | POA: Diagnosis present

## 2013-11-23 DIAGNOSIS — Z3403 Encounter for supervision of normal first pregnancy, third trimester: Secondary | ICD-10-CM | POA: Diagnosis present

## 2013-11-23 DIAGNOSIS — IMO0002 Reserved for concepts with insufficient information to code with codable children: Secondary | ICD-10-CM | POA: Diagnosis present

## 2013-11-23 DIAGNOSIS — O365930 Maternal care for other known or suspected poor fetal growth, third trimester, not applicable or unspecified: Secondary | ICD-10-CM | POA: Diagnosis present

## 2013-11-23 LAB — TYPE AND SCREEN
ABO/RH(D): A POS
Antibody Screen: NEGATIVE

## 2013-11-23 LAB — CBC
HCT: 31.6 % — ABNORMAL LOW (ref 36.0–46.0)
Hemoglobin: 10.6 g/dL — ABNORMAL LOW (ref 12.0–15.0)
MCH: 28.3 pg (ref 26.0–34.0)
MCHC: 33.5 g/dL (ref 30.0–36.0)
MCV: 84.5 fL (ref 78.0–100.0)
Platelets: 98 10*3/uL — ABNORMAL LOW (ref 150–400)
RBC: 3.74 MIL/uL — ABNORMAL LOW (ref 3.87–5.11)
RDW: 14 % (ref 11.5–15.5)
WBC: 10.6 10*3/uL — ABNORMAL HIGH (ref 4.0–10.5)

## 2013-11-23 LAB — ABO/RH: ABO/RH(D): A POS

## 2013-11-23 MED ORDER — OXYCODONE-ACETAMINOPHEN 5-325 MG PO TABS: 1.0000 | ORAL_TABLET | ORAL | Status: DC | PRN

## 2013-11-23 MED ORDER — OXYCODONE-ACETAMINOPHEN 5-325 MG PO TABS: 2.0000 | ORAL_TABLET | ORAL | Status: DC | PRN

## 2013-11-23 MED ORDER — DEXTROSE 5 % IV SOLN
2.5000 10*6.[IU] | INTRAVENOUS | Status: DC
  Administered 2013-11-24 – 2013-11-25 (×7): 2.5 10*6.[IU] via INTRAVENOUS
  Filled 2013-11-23 (×10): qty 2.5

## 2013-11-23 MED ORDER — ACETAMINOPHEN 325 MG PO TABS: 650.0000 mg | ORAL_TABLET | ORAL | Status: DC | PRN

## 2013-11-23 MED ORDER — NALBUPHINE HCL 10 MG/ML IJ SOLN
10.0000 mg | INTRAMUSCULAR | Status: DC | PRN
  Administered 2013-11-25: 10 mg via INTRAVENOUS
  Filled 2013-11-23 (×2): qty 1

## 2013-11-23 MED ORDER — PENICILLIN G POTASSIUM 5000000 UNITS IJ SOLR
5.0000 10*6.[IU] | Freq: Once | INTRAVENOUS | Status: AC
  Administered 2013-11-24: 5 10*6.[IU] via INTRAVENOUS
  Filled 2013-11-23: qty 5

## 2013-11-23 MED ORDER — OXYTOCIN 40 UNITS IN LACTATED RINGERS INFUSION - SIMPLE MED
62.5000 mL/h | INTRAVENOUS | Status: DC
  Administered 2013-11-25: 62.5 mL/h via INTRAVENOUS

## 2013-11-23 MED ORDER — ONDANSETRON HCL 4 MG/2ML IJ SOLN: 4.0000 mg | Freq: Four times a day (QID) | INTRAMUSCULAR | Status: DC | PRN

## 2013-11-23 MED ORDER — MISOPROSTOL 25 MCG QUARTER TABLET
25.0000 ug | ORAL_TABLET | ORAL | Status: DC | PRN
  Administered 2013-11-23 – 2013-11-24 (×2): 25 ug via VAGINAL
  Filled 2013-11-23 (×2): qty 0.25
  Filled 2013-11-23: qty 1

## 2013-11-23 MED ORDER — TERBUTALINE SULFATE 1 MG/ML IJ SOLN: 0.2500 mg | Freq: Once | INTRAMUSCULAR | Status: AC | PRN

## 2013-11-23 MED ORDER — LACTATED RINGERS IV SOLN
INTRAVENOUS | Status: DC
  Administered 2013-11-23 – 2013-11-24 (×3): via INTRAVENOUS
  Administered 2013-11-24: 1000 mL via INTRAVENOUS
  Administered 2013-11-25 (×2): via INTRAVENOUS

## 2013-11-23 MED ORDER — OXYTOCIN BOLUS FROM INFUSION: 500.0000 mL | INTRAVENOUS | Status: DC

## 2013-11-23 MED ORDER — LIDOCAINE HCL (PF) 1 % IJ SOLN
30.0000 mL | INTRAMUSCULAR | Status: DC | PRN
  Administered 2013-11-25: 30 mL via SUBCUTANEOUS
  Filled 2013-11-23: qty 30

## 2013-11-23 MED ORDER — LACTATED RINGERS IV SOLN
500.0000 mL | INTRAVENOUS | Status: DC | PRN
  Administered 2013-11-25 (×2): 500 mL via INTRAVENOUS

## 2013-11-23 MED ORDER — CITRIC ACID-SODIUM CITRATE 334-500 MG/5ML PO SOLN
30.0000 mL | ORAL | Status: DC | PRN
  Administered 2013-11-24: 30 mL via ORAL
  Filled 2013-11-23: qty 15

## 2013-11-23 NOTE — H&P (Signed)
[name] [name] is a 28 y.o. female, G1P0 at 7337 weeks, presenting for IOL for IUGR. Reports active fetus and denies LOF, VB, and ctx. Pregnancy also complicated by thrombocytopenia and an abnormal AFP.  Patient is GBS positive with negative GC/CT.    Patient Active Problem List   Diagnosis Date Noted  . Thrombocytopenia, unspecified--platelets 109 09/24/13 09/24/2013  . Echogenic intracardiac focus of fetus on prenatal ultrasound 09/24/2013  . IUGR (intrauterine growth restriction) 09/22/2013  . 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 GONORRHEA 02/17/2010  . TOBACCO ABUSE 11/05/2009  . GERD 09/17/2009    History of present pregnancy: Patient entered care at 9.6 weeks.   EDC of 12/14/2013 was established by LMP of 03/09/2013 and confirmed by 12wk US on 06/07/2013.   Anatomy scan:  20 weeks, with abnormal finding (SIUP, NORMAL FLUID, LEFT VENT INTRACARD FOCUS. IRREGULAR FETAL HEART RATE, FETAL SPINE NOT SEEN, FEMALE, GROWTH 32%, CX 3.12 CM) and an anterior placenta.  Sent for MFM consult Additional US evaluations:   -24wks: MFM ZO:XWRUES:LVICF, no arrhythmia, pylectasis, low risk for genetic abnormality. -28wks:   Vertex presentation. Cervix measures 4.45 cm. Fluid volume is normal. Anterior placenta. Echogenic intracardiac focus is again seen today. The kidneys appear normal. Gross today equals 915 g. This is at the 3rd percentile for [redacted] week gestation -27.3wks:Dopplers today mildly elevated above 50% with S/D 3.6 (50%-3.1, 95%-4.55). No absent or reversed flow dopplered. BPP 8/8. Consult with Dr. ND: will keep EDD 12/3  -29wks:AFI 12. CEPHALIC. UAD NORMAL WITH S/D 2.95. NO AEDF OR REVERSED FLOW. BPP 8/8. -30wks: EFW 2lbs5oz 12%, AFI 11.8, UA dopplers elevated at 95% without evidence of AEDF or Reversed flow. BPP 8/8, nl fluid, vtx, ant placenta. -31wks: BPP TODAY: 8/8. UAD ELEVATED BUT NO REVERSED OR ABSENT FLOW.  -32.1wks: EFW 3-8 7% NORMAL FLUID WITH ELEVATED DOPPLERS. NO REVERSE OR ABSENT FLOW.  -34wks: NST  reactive BPP 6/8 no breathing U/S EFW 4'3'' 6th % Overall 8/10 -35wks: reactive NST baseline 130 accels up tp 140 u/s AFI WNL, Doppes s/d ratio at 95%. No absent or reverse flow. BPP 10/10 -36wks: Single gestation, vertex, normal fluid, weight equals 2173 g (4th percentile), Doppler studies show a mildly elevated S/D ratio but no absent end-diastolic flow. Placenta is said to be aging. Nonstress test is reactive. Significant prenatal events:  Primary thrombocytopenia, IUGR, AFP abnormal, Fetal heart echogenicity on US.  Elevated UA doppers. MFM consult that recommended delivery at 37wks.   Last evaluation:  11/20/2013 by Dr. AVS  OB History    Gravida Para Term Preterm AB TAB SAB Ectopic Multiple Living   1              Past Medical History  Diagnosis Date  . GERD (gastroesophageal reflux disease)     OTC as needed  . Laceration of right thumb 08/16/2011    right thumb digital nerve lac.; sutures x 8, per pt.  . Irregular menses     states only 2 periods/year  . Gonorrhea   . Trichomonas vaginitis    Past Surgical History  Procedure Laterality Date  . No past surgeries    . Nerve repair  08/31/2011    Procedure: NERVE REPAIR;  Surgeon: Tami RibasKevin R Kuzma, MD;  Location: [facility];  Service: Orthopedics;  Laterality: Right;  right thumb digital nerve repair  . Wisdom tooth extraction     Family History: family history includes Heart disease in her maternal grandfather; Stroke in her maternal grandfather. There is  no history of Hearing loss. Social History:  reports that she quit smoking about 6 months ago. Her smoking use included Cigarettes. She smoked 0.00 packs per day for 4 years. She has never used smokeless tobacco. She reports that she does not drink alcohol or use illicit drugs.   Prenatal Transfer Tool  Maternal Diabetes: No Genetic Screening: Abnormal:  Results: Elevated AFP Maternal Ultrasounds/Referrals: Abnormal:  Findings:   IUGR, Isolated EIF (echogenic  intracardiac focus) Fetal Ultrasounds or other Referrals:  Referred to Materal Fetal Medicine  Maternal Substance Abuse:  No Significant Maternal Medications:  Meds include: Other: PNV Significant Maternal Lab Results: Lab values include: Group B Strep positive    ROS:  All systems negative  Allergies  Allergen Reactions  . Sulfonamide Derivatives Hives     Last menstrual period 03/09/2013.  FHR: 140 bpm, Mod Var, -Decels, +Accels UCs:  Occasional graphed  Prenatal labs: ABO, Rh:  A Positive Antibody:  Negative Rubella:   Immune RPR:   NR HBsAg:   Negative HIV:   Negative GBS:  Positive Sickle cell/Hgb electrophoresis:  Normal Pap:  Unknown GC:  Negative Chlamydia:  Negative Genetic screenings:  Abnormal AFP Glucola:  Normal 3hr Other:  None   Assessment IUP at 37wks Cat I FT IUGR Thrombocytopenia GBS Positive  Plan: [facility] per consult with Dr. Katharine LookJ. Ozan Routine Labor and Delivery Admission Orders per [facility] protocol Induction/Augmentation orders  Start PCN for GBS prophylaxis when in active labor or at initiation of pitocin Will begin induction process with cervical ripening agents and reassess prn   [name] [name] LYNNCNM, MSN 11/23/2013, 7:09 PM

## 2013-11-23 NOTE — Progress Notes (Signed)
Anesthesia; Dr. Malen GauzeFoster made aware of patient platelets 98 at 1930.

## 2013-11-23 NOTE — Progress Notes (Signed)
[name] [name], [id number]   Subjective -Patient for induction of labor due to IUGR.  FOB at bedside.  Patient denies LOF, VB, and CTX.  Reports active fetus.   Objective Filed Vitals:   11/23/13 1915  BP: 107/66  Pulse: 103  Temp: 97.7 F (36.5 C)  Resp: 20        Physical Exam  Constitutional: She is well-developed, well-nourished, and in no distress.  Cardiovascular: Normal rate, regular rhythm and normal heart sounds.   Pulmonary/Chest: Effort normal and breath sounds normal.  Abdominal: Soft. Bowel sounds are normal.  Appears gravid--fundal height AGA, Soft NT Leopolds: -Position: Cephalic -EFW: ~5-6lbs  Genitourinary: Vagina normal.  Musculoskeletal: Normal range of motion.  Skin: Skin is warm and dry.  Psychiatric: Mood and affect normal.   SVE:   Closed/Thick & Long/Posterior, Soft, -3 Adequate Pelvis:Yes  FHR: 145 bpm, Mod Var, -Decels, +Accels UC:  Occasional graphed  Induction/Augmentation Agent: Pitocin: None Cytotec Dose: Initiated Membranes: Intact  Assessment IUP at 37wks Cat I FT Bishop Score: 2 IUGR Thrombocytopenia  Plan Discussed induction plan including usage of pharmacologic and non-pharmacologic methods -R/B of induction discussed including, but not limited to, fetal bradycardia and/or intolerance, infection, serial induction, and C/S -Patient and FOB verbalize understanding of r/b and induction process and methods -Discussed current platelet level of <110 and inability to receive epidural.  Patient verbalized understanding and reports previous education regarding this fact. -Cytotec placed  -Continue other mgmt as ordered  Jennica Tagliaferri LYNN, CNM 8:26 PM

## 2013-11-24 LAB — CBC
HCT: 34.5 % — ABNORMAL LOW (ref 36.0–46.0)
HCT: 34.1 % — ABNORMAL LOW (ref 36.0–46.0)
HEMATOCRIT: 33.6 % — AB (ref 36.0–46.0)
HEMOGLOBIN: 11.7 g/dL — AB (ref 12.0–15.0)
Hemoglobin: 11.3 g/dL — ABNORMAL LOW (ref 12.0–15.0)
Hemoglobin: 11.5 g/dL — ABNORMAL LOW (ref 12.0–15.0)
MCH: 28.5 pg (ref 26.0–34.0)
MCH: 28.7 pg (ref 26.0–34.0)
MCH: 28.6 pg (ref 26.0–34.0)
MCHC: 33.6 g/dL (ref 30.0–36.0)
MCHC: 33.9 g/dL (ref 30.0–36.0)
MCHC: 33.7 g/dL (ref 30.0–36.0)
MCV: 84.6 fL (ref 78.0–100.0)
MCV: 84.8 fL (ref 78.0–100.0)
MCV: 84.8 fL (ref 78.0–100.0)
Platelets: 79 10*3/uL — ABNORMAL LOW (ref 150–400)
Platelets: 83 10*3/uL — ABNORMAL LOW (ref 150–400)
Platelets: 79 10*3/uL — ABNORMAL LOW (ref 150–400)
RBC: 3.97 MIL/uL (ref 3.87–5.11)
RBC: 4.07 MIL/uL (ref 3.87–5.11)
RBC: 4.02 MIL/uL (ref 3.87–5.11)
RDW: 14.1 % (ref 11.5–15.5)
RDW: 14.1 % (ref 11.5–15.5)
RDW: 14.1 % (ref 11.5–15.5)
WBC: 12.4 10*3/uL — ABNORMAL HIGH (ref 4.0–10.5)
WBC: 11.8 10*3/uL — ABNORMAL HIGH (ref 4.0–10.5)
WBC: 11.8 10*3/uL — ABNORMAL HIGH (ref 4.0–10.5)

## 2013-11-24 LAB — RPR

## 2013-11-24 MED ORDER — TERBUTALINE SULFATE 1 MG/ML IJ SOLN: 0.2500 mg | Freq: Once | INTRAMUSCULAR | Status: AC | PRN

## 2013-11-24 MED ORDER — OXYTOCIN 40 UNITS IN LACTATED RINGERS INFUSION - SIMPLE MED
1.0000 m[IU]/min | INTRAVENOUS | Status: DC
  Administered 2013-11-24: 6 m[IU]/min via INTRAVENOUS
  Administered 2013-11-24: 3 m[IU]/min via INTRAVENOUS
  Administered 2013-11-24: 7 m[IU]/min via INTRAVENOUS
  Administered 2013-11-24: 1 m[IU]/min via INTRAVENOUS
  Administered 2013-11-24: 5 m[IU]/min via INTRAVENOUS
  Administered 2013-11-24: 8 m[IU]/min via INTRAVENOUS
  Administered 2013-11-24: 4 m[IU]/min via INTRAVENOUS
  Administered 2013-11-24: 2 m[IU]/min via INTRAVENOUS
  Administered 2013-11-25: 1 m[IU]/min via INTRAVENOUS
  Filled 2013-11-24: qty 1000

## 2013-11-24 MED ORDER — ZOLPIDEM TARTRATE 5 MG PO TABS: 5.0000 mg | ORAL_TABLET | Freq: Every evening | ORAL | Status: DC | PRN

## 2013-11-24 NOTE — Progress Notes (Signed)
  Subjective: Very comfortable, minimal awareness of contractions.    Objective: BP 109/65 mmHg  Pulse 88  Temp(Src) 97.3 F (36.3 C) (Oral)  Resp 18  Ht 5\' 2"  (1.575 m)  Wt 173 lb (78.472 kg)  BMI 31.63 kg/m2  LMP 03/09/2013      FHT: Category 1 UC:   Irregular, mild SVE:   1 cm, 50%, vtx, -2. External os thin, then body of cervix thicker.  Pitocin at 8 mu/min  Assessment:  Induction at 37 1/7 weeks for IUGR, abnormal dopplers, abnormal AFP, thrombocytopenia, LVEIF GBS positive Latent labor--cervix unchanged  Plan: D/C pitocin. Will allow patient to eat and shower as desired. Assess after meal for possible foley bulb placement. Recheck CBC at 7:30p  Nigel BridgemanLATHAM, Jeilyn Reznik CNM 11/24/2013, 6:45 PM

## 2013-11-24 NOTE — Progress Notes (Signed)
  Subjective: Assuming care of this 28 yo G1P0 @ 37.1 wks IOL for IUGR, abnormal dopplers/AFP and thrombocytopenia. +FM, -VB, -LOF, - ctxs. FOB at bedside.  Objective: BP 109/65 mmHg  Pulse 88  Temp(Src) 97.3 F (36.3 C) (Oral)  Resp 18  Ht 5\' 2"  (1.575 m)  Wt 173 lb (78.472 kg)  BMI 31.63 kg/m2  LMP 03/09/2013     Results for orders placed or performed during the hospital encounter of 11/23/13 (from the past 24 hour(s))  CBC     Status: Abnormal   Collection Time: 11/24/13  6:50 AM  Result Value Ref Range   WBC 12.4 (H) 4.0 - 10.5 K/uL   RBC 3.97 3.87 - 5.11 MIL/uL   Hemoglobin 11.3 (L) 12.0 - 15.0 g/dL   HCT 16.133.6 (L) 09.636.0 - 04.546.0 %   MCV 84.6 78.0 - 100.0 fL   MCH 28.5 26.0 - 34.0 pg   MCHC 33.6 30.0 - 36.0 g/dL   RDW 40.914.1 81.111.5 - 91.415.5 %   Platelets 79 (L) 150 - 400 K/uL  CBC     Status: Abnormal   Collection Time: 11/24/13  1:31 PM  Result Value Ref Range   WBC 11.8 (H) 4.0 - 10.5 K/uL   RBC 4.07 3.87 - 5.11 MIL/uL   Hemoglobin 11.7 (L) 12.0 - 15.0 g/dL   HCT 78.234.5 (L) 95.636.0 - 21.346.0 %   MCV 84.8 78.0 - 100.0 fL   MCH 28.7 26.0 - 34.0 pg   MCHC 33.9 30.0 - 36.0 g/dL   RDW 08.614.1 57.811.5 - 46.915.5 %   Platelets 83 (L) 150 - 400 K/uL  CBC     Status: Abnormal   Collection Time: 11/24/13  7:35 PM  Result Value Ref Range   WBC 11.8 (H) 4.0 - 10.5 K/uL   RBC 4.02 3.87 - 5.11 MIL/uL   Hemoglobin 11.5 (L) 12.0 - 15.0 g/dL   HCT 62.934.1 (L) 52.836.0 - 41.346.0 %   MCV 84.8 78.0 - 100.0 fL   MCH 28.6 26.0 - 34.0 pg   MCHC 33.7 30.0 - 36.0 g/dL   RDW 24.414.1 01.011.5 - 27.215.5 %   Platelets 79 (L) 150 - 400 K/uL    FHT: BL 150 w/ mod variability, occ mild variables lasting less than 30 secs w/ quick recovery, +accels UC:   irregular, every 3-4 minutes SVE: Deferred    Assessment:  IUP at 37.1 wks IOL due to IUGR, abnormal dopplers/AFP and thrombocytopenia (plts 79 at 1935 this evening) Overall reassuring FHRT GBS positive  Plan: Allow rest/dinner  Will later reevaluate cx and re-attempt foley  bulb and restart Pitocin Consult prn   Sherre ScarletWILLIAMS, [name] [name] CNM 11/24/2013, 8:55 PM

## 2013-11-24 NOTE — Progress Notes (Signed)
[name] [name] MRN: [id number]  Subjective: -Strip Reviewed  Objective: BP 107/63 mmHg  Pulse 78  Temp(Src) 97.5 F (36.4 C) (Oral)  Resp 20  Ht 5\' 2"  (1.575 m)  Wt 173 lb (78.472 kg)  BMI 31.63 kg/m2  LMP 03/09/2013     FHT:125  bpm, Mod Var, -Decels, +Accels UC:   Q1-493min SVE:   Dilation: Fingertip Effacement (%): 50 Station: -2, -1 Exam by:: Laquenta Whitsell CNM Membranes: Intact Pitocin:None Cytotec: 2nd Dose at 0100  Assessment:  IUP at 37.1wks Cat I FT IUGR Cervical Ripening  Plan: -Continue mgmt as ordered -Will continue to observe  Undine Nealis LYNN,CNM, MSN 11/24/2013, 3:30 AM

## 2013-11-24 NOTE — Progress Notes (Signed)
  Subjective: Aware of some UCs.  Slept some during night.  Family at bedside.  Objective: BP 112/65 mmHg  Pulse 72  Temp(Src) 97.8 F (36.6 C) (Oral)  Resp 18  Ht 5\' 2"  (1.575 m)  Wt 173 lb (78.472 kg)  BMI 31.63 kg/m2  LMP 03/09/2013      FHT: Category 1 UC:   irregular, every 2-6 minutes SVE:   Dilation: 1 Effacement (%): 70 Station: -2 Exam by:: Paul Dykesina Feir RN Cervix softer.  Results for orders placed or performed during the hospital encounter of 11/23/13 (from the past 24 hour(s))  CBC     Status: Abnormal   Collection Time: 11/23/13  7:31 PM  Result Value Ref Range   WBC 10.6 (H) 4.0 - 10.5 K/uL   RBC 3.74 (L) 3.87 - 5.11 MIL/uL   Hemoglobin 10.6 (L) 12.0 - 15.0 g/dL   HCT 16.131.6 (L) 09.636.0 - 04.546.0 %   MCV 84.5 78.0 - 100.0 fL   MCH 28.3 26.0 - 34.0 pg   MCHC 33.5 30.0 - 36.0 g/dL   RDW 40.914.0 81.111.5 - 91.415.5 %   Platelets 98 (L) 150 - 400 K/uL  RPR     Status: None   Collection Time: 11/23/13  7:31 PM  Result Value Ref Range   RPR NON REAC NON REAC  Type and screen     Status: None   Collection Time: 11/23/13  7:31 PM  Result Value Ref Range   ABO/RH(D) A POS    Antibody Screen NEG    Sample Expiration 11/26/2013   ABO/Rh     Status: None   Collection Time: 11/23/13  7:31 PM  Result Value Ref Range   ABO/RH(D) A POS   CBC     Status: Abnormal   Collection Time: 11/24/13  6:50 AM  Result Value Ref Range   WBC 12.4 (H) 4.0 - 10.5 K/uL   RBC 3.97 3.87 - 5.11 MIL/uL   Hemoglobin 11.3 (L) 12.0 - 15.0 g/dL   HCT 78.233.6 (L) 95.636.0 - 21.346.0 %   MCV 84.6 78.0 - 100.0 fL   MCH 28.5 26.0 - 34.0 pg   MCHC 33.6 30.0 - 36.0 g/dL   RDW 08.614.1 57.811.5 - 46.915.5 %   Platelets 79 (L) 150 - 400 K/uL     Assessment:  IUP at 37 1/7 weeks IUGR, with elevated dopplers, LVEIF, abnormal AFP Thrombocytopenia GBS positive.  Plan: Reviewed status of cervix, FHR, platelet count, GBS status. Plan pitocin initiation now. Start GBS prophylaxis. Anesthesia consult for thrombocytopenia Patient  understands epidural/regional anesthesia may be contraindicated due to low platelets. Reviewed need for close observation of FHR status during labor.  Nigel BridgemanLATHAM, Anye Brose CNM 11/24/2013, 9:04 AM

## 2013-11-24 NOTE — Progress Notes (Addendum)
  Subjective: In rocking chair at bedside--aware of contractions as "pressure", no pain.  Family at bedside.   Objective: BP 117/82 mmHg  Pulse 81  Temp(Src) 97.3 F (36.3 C) (Oral)  Resp 18  Ht 5\' 2"  (1.575 m)  Wt 173 lb (78.472 kg)  BMI 31.63 kg/m2  LMP 03/09/2013      FHT: Category 1 UC:   irregular, every 2-6 minutes SVE:   Deferred at present Pitocin at 4 mu/min  Assessment:  Induction for IUGR, elevated dopplers Thrombocytopenia Abnormal AFP GBS positive  Plan: Continue pitocin induction Recheck CBC now.  Nigel BridgemanLATHAM, Alann Avey CNM 11/24/2013, 2p

## 2013-11-24 NOTE — Progress Notes (Signed)
[name] [name], [id number]   Subjective -Patient resting in bed.  Reports active fetus and no perception of contractions.    Objective Filed Vitals:   11/23/13 2351  BP: 101/54  Pulse: 81  Temp: 97.9 F (36.6 C)  Resp: 16        Physical Exam [id number]:Dilation: Fingertip Effacement (%): 50 Cervical Position: Anterior Station: -2, -1 Presentation: Vertex Exam by:: Gage Weant CNM  Adequate Pelvis:Yes  FHR: 135 bpm, Mod Var, -Decels, +Accels UC:  Irregular graphed, None palpated.  Toco readjusted  Induction/Augmentation Agent: Pitocin: None Cytotec Dose: 2nd Membranes: Intact  Assessment IUP at 37.1wks Cat I FT Bishop Score: 5 IUGR Cervical Ripening  Plan -Cytotec placed -Position changes for improved graphing of FHR and UC -Continue other mgmt as ordered  [name] [name], CNM   1:13 AM

## 2013-11-24 NOTE — Progress Notes (Addendum)
[name] [name] MRN: [id number]  Subjective: -Patient reports no discomfort.  States she was able to sleep between assessments.  FOB and mother at bedside.   Objective: BP 112/65 mmHg  Pulse 72  Temp(Src) 97.8 F (36.6 C) (Oral)  Resp 18  Ht 5\' 2"  (1.575 m)  Wt 173 lb (78.472 kg)  BMI 31.63 kg/m2  LMP 03/09/2013     FHT: 130 bpm, Mod Var, -Decels, +Accels UC:   Irregular, palpates mild SVE:   Dilation: 1 Effacement (%): 50 Station: -2, -1 Exam by:: Jermany Sundell CNM Membranes: Intact Pitocin: To be initiated Foley Bulb Attempted, Unsuccessful placement  Assessment:  IUP at 37.1wks Cat I FT IUGR Thrombocytopenia GBS Positive  Bishop Score:7-8  Plan: -Okay to shower and have light breakfast -Okay to discontinue EFM for shower -Start PCN prophylaxis -Initiate pitocin and manage per hospital protocol -Repeat CBC at 0700 -Continue other mgmt as ordered -Dr. Katharine LookJ. Ozan updated on patient status, agrees with plan -Report given to V. Emilee HeroLatham, CNM  Christapher Gillian LYNN,CNM, MSN 11/24/2013, 6:32 AM

## 2013-11-25 ENCOUNTER — Encounter (HOSPITAL_COMMUNITY): Payer: Self-pay | Admitting: Anesthesiology

## 2013-11-25 ENCOUNTER — Encounter (HOSPITAL_COMMUNITY): Payer: Self-pay | Admitting: *Deleted

## 2013-11-25 LAB — CBC
HCT: 33.6 % — ABNORMAL LOW (ref 36.0–46.0)
HEMATOCRIT: 36.8 % (ref 36.0–46.0)
HEMATOCRIT: 34 % — AB (ref 36.0–46.0)
HEMOGLOBIN: 12.3 g/dL (ref 12.0–15.0)
HEMOGLOBIN: 11.6 g/dL — AB (ref 12.0–15.0)
Hemoglobin: 11.3 g/dL — ABNORMAL LOW (ref 12.0–15.0)
MCH: 28.7 pg (ref 26.0–34.0)
MCH: 28.7 pg (ref 26.0–34.0)
MCH: 29 pg (ref 26.0–34.0)
MCHC: 33.6 g/dL (ref 30.0–36.0)
MCHC: 33.4 g/dL (ref 30.0–36.0)
MCHC: 34.1 g/dL (ref 30.0–36.0)
MCV: 85.3 fL (ref 78.0–100.0)
MCV: 86 fL (ref 78.0–100.0)
MCV: 85 fL (ref 78.0–100.0)
PLATELETS: 86 10*3/uL — AB (ref 150–400)
Platelets: 99 10*3/uL — ABNORMAL LOW (ref 150–400)
Platelets: 79 10*3/uL — ABNORMAL LOW (ref 150–400)
RBC: 3.94 MIL/uL (ref 3.87–5.11)
RBC: 4.28 MIL/uL (ref 3.87–5.11)
RBC: 4 MIL/uL (ref 3.87–5.11)
RDW: 14.1 % (ref 11.5–15.5)
RDW: 14.3 % (ref 11.5–15.5)
RDW: 14.1 % (ref 11.5–15.5)
WBC: 9.9 10*3/uL (ref 4.0–10.5)
WBC: 11.5 10*3/uL — AB (ref 4.0–10.5)
WBC: 10.9 10*3/uL — ABNORMAL HIGH (ref 4.0–10.5)

## 2013-11-25 MED ORDER — OXYCODONE-ACETAMINOPHEN 5-325 MG PO TABS: 1.0000 | ORAL_TABLET | ORAL | Status: DC | PRN

## 2013-11-25 MED ORDER — PRENATAL MULTIVITAMIN CH
1.0000 | ORAL_TABLET | Freq: Every day | ORAL | Status: DC
  Administered 2013-11-26 – 2013-11-27 (×2): 1 via ORAL
  Filled 2013-11-25 (×2): qty 1

## 2013-11-25 MED ORDER — ONDANSETRON HCL 4 MG/2ML IJ SOLN: 4.0000 mg | INTRAMUSCULAR | Status: DC | PRN

## 2013-11-25 MED ORDER — WITCH HAZEL-GLYCERIN EX PADS: 1.0000 "application " | MEDICATED_PAD | CUTANEOUS | Status: DC | PRN

## 2013-11-25 MED ORDER — ONDANSETRON HCL 4 MG PO TABS: 4.0000 mg | ORAL_TABLET | ORAL | Status: DC | PRN

## 2013-11-25 MED ORDER — DIPHENHYDRAMINE HCL 25 MG PO CAPS: 25.0000 mg | ORAL_CAPSULE | Freq: Four times a day (QID) | ORAL | Status: DC | PRN

## 2013-11-25 MED ORDER — IBUPROFEN 600 MG PO TABS
600.0000 mg | ORAL_TABLET | Freq: Four times a day (QID) | ORAL | Status: DC
  Administered 2013-11-25 – 2013-11-27 (×7): 600 mg via ORAL
  Filled 2013-11-25 (×7): qty 1

## 2013-11-25 MED ORDER — LACTATED RINGERS IV SOLN
INTRAVENOUS | Status: DC
  Administered 2013-11-25: 18:00:00 via INTRAUTERINE

## 2013-11-25 MED ORDER — LANOLIN HYDROUS EX OINT: TOPICAL_OINTMENT | CUTANEOUS | Status: DC | PRN

## 2013-11-25 MED ORDER — ZOLPIDEM TARTRATE 5 MG PO TABS: 5.0000 mg | ORAL_TABLET | Freq: Every evening | ORAL | Status: DC | PRN

## 2013-11-25 MED ORDER — BENZOCAINE-MENTHOL 20-0.5 % EX AERO: 1.0000 "application " | INHALATION_SPRAY | CUTANEOUS | Status: DC | PRN

## 2013-11-25 MED ORDER — OXYCODONE-ACETAMINOPHEN 5-325 MG PO TABS: 2.0000 | ORAL_TABLET | ORAL | Status: DC | PRN

## 2013-11-25 MED ORDER — NALBUPHINE HCL 10 MG/ML IJ SOLN
10.0000 mg | Freq: Once | INTRAMUSCULAR | Status: AC
  Administered 2013-11-25: 10 mg via INTRAVENOUS

## 2013-11-25 MED ORDER — SENNOSIDES-DOCUSATE SODIUM 8.6-50 MG PO TABS
2.0000 | ORAL_TABLET | ORAL | Status: DC
  Administered 2013-11-26 (×2): 2 via ORAL
  Filled 2013-11-25 (×2): qty 2

## 2013-11-25 MED ORDER — SIMETHICONE 80 MG PO CHEW: 80.0000 mg | CHEWABLE_TABLET | ORAL | Status: DC | PRN

## 2013-11-25 MED ORDER — DIBUCAINE 1 % RE OINT: 1.0000 "application " | TOPICAL_OINTMENT | RECTAL | Status: DC | PRN

## 2013-11-25 MED ORDER — TETANUS-DIPHTH-ACELL PERTUSSIS 5-2.5-18.5 LF-MCG/0.5 IM SUSP: 0.5000 mL | Freq: Once | INTRAMUSCULAR | Status: DC

## 2013-11-25 NOTE — Progress Notes (Signed)
Anesthesia assessment I had a very detailed discussion with [name] this morning regarding her possible labor events and epidural vs. None. I mentioned that there is wide variability among practitioners as to what platelet count is below the "comfort" threshold. I told her that I do not feel comfortable doing an epidural with a platelet count that is hovering in the high 70's or low 80's. I discussed that if all things remain the same and she has to come for C/S and she refuses GA, then I would be willing to consider spinal anesthesia for C/S delivery. She expressed understanding and had opportunity to ask questions. She felt comfortable with the plan.

## 2013-11-25 NOTE — Progress Notes (Signed)
  Subjective: Foley bulb just fell out.  Patient denies pain.  Objective: BP 105/57 mmHg  Pulse 68  Temp(Src) 97.8 F (36.6 C) (Oral)  Resp 16  Ht 5\' 2"  (1.575 m)  Wt 173 lb (78.472 kg)  BMI 31.63 kg/m2  SpO2 74%  LMP 03/09/2013      FHT: Category 1 UC:   Irregular SVE:   Dilation: 4 Effacement (%): 70 Station: -2 Exam by:: Othon Guardia  Vtx ballotable at present--will defer AROM until head descends better.  Pitocin at 9 mu/min  Assessment:  Progressive dilation s/p foley bulb  Plan: Will continue pitocin induction--re-evaluate cervix in 2 hours for possible AROM. Per Dr. Renold DonGermeroth, would defer epidural placement at current platelet count, but may be candidate for spinal if C/S needed, and patient refused general. Recheck CBC now  [name] [name], [name] [name] CNM 11/25/2013, 1:25 PM

## 2013-11-25 NOTE — Progress Notes (Signed)
  Subjective: Sleeping, yet easily aroused.  Objective: BP 123/79 mmHg  Pulse 75  Temp(Src) 97.9 F (36.6 C) (Oral)  Resp 18  Ht 5\' 2"  (1.575 m)  Wt 173 lb (78.472 kg)  BMI 31.63 kg/m2  SpO2 74%  LMP 03/09/2013     FHT: BL FHR 135 w/ mod variability, +accels, no decels UC: None appreciated SVE:   Dilation: 1 Effacement (%): 70 Station: -2 Exam by:: Seryna Marek CNM Foley bulb placed at 0010, traction applied  Late decel to 90s noted after pt returned to bed after up to BR - gradual return to baseline  Assessment:  IUP at 37.2 wks Cat 1 now  Plan: Restart Pitocin in 1 hr Update Dr. Elmer Rampole  [name] [name], [name] [name] CNM 11/25/2013, 12:20 AM

## 2013-11-25 NOTE — Progress Notes (Signed)
  Subjective: Feeling contractions more now.  Family at bedside.  Objective: BP 100/57 mmHg  Pulse 78  Temp(Src) 97.8 F (36.6 C) (Oral)  Resp 18  Ht 5\' 2"  (1.575 m)  Wt 173 lb (78.472 kg)  BMI 31.63 kg/m2  SpO2 74%  LMP 03/09/2013      FHT: Category 1 at present.  Had episode of late decels while on Birthing Ball. Resolved after returning to bed. UC:   regular, every 2-3 minutes SVE:   Dilation: 4 Effacement (%): 70 Station: -1 Exam by:: V.Ohanna Gassert, CNM  BBOW, AROM--very light MSF IUPC placed. Pitocin at 13 mu/min  CBC Latest Ref Rng 11/25/2013 11/25/2013 11/24/2013  WBC 4.0 - 10.5 K/uL 11.5(H) 9.9 11.8(H)  Hemoglobin 12.0 - 15.0 g/dL 69.612.3 11.3(L) 11.5(L)  Hematocrit 36.0 - 46.0 % 36.8 33.6(L) 34.1(L)  Platelets 150 - 400 K/uL 99(L) 86(L) 79(L)  Most recent CBC at 1354   Assessment:  Early labor IUGR, abnormal dopplers, LVEIF, abnormal AFP Thrombocytopenia  Plan: Continue pitocin. Close observation of FHR status. Patient declines epidural and dry cath placement. Repeat CBC 8pm  Eleanna Theilen CNM 11/25/2013, 5:29 PM

## 2013-11-25 NOTE — Progress Notes (Signed)
  Subjective: Comfortable, no pain.  Slept well.  No discomfort with foley.  Family at bedside.  Objective: BP 116/73 mmHg  Pulse 93  Temp(Src) 97.8 F (36.6 C) (Oral)  Resp 18  Ht 5\' 2"  (1.575 m)  Wt 173 lb (78.472 kg)  BMI 31.63 kg/m2  SpO2 74%  LMP 03/09/2013      FHT: Category 1 UC:   Irregular, mild SVE:  Deferred at present. Foley still in place.  Pitocin at 5 mu/min  CBC Latest Ref Rng 11/25/2013 11/24/2013 11/24/2013  WBC 4.0 - 10.5 K/uL 9.9 11.8(H) 11.8(H)  Hemoglobin 12.0 - 15.0 g/dL 11.3(L) 11.5(L) 11.7(L)  Hematocrit 36.0 - 46.0 % 33.6(L) 34.1(L) 34.5(L)  Platelets 150 - 400 K/uL 86(L) 79(L) 83(L)  Last CBC at 0705    Assessment:  IUP at 37 2/7 weeks--day 2 IUGR, abnormal dopplers, LVEIF, abnormal AFP Thrombocytopenia GBS positive  Plan: Await extrusion of foley. Continue pitocin. Will allow light laboring diet. Recheck CBC at 1p or prn. Will update Anesthesia on platelet count.   Nigel BridgemanLATHAM, Haniel Fix CNM 11/25/2013, 9:10 AM

## 2013-11-25 NOTE — Progress Notes (Signed)
  Subjective: Resting comfortably.  Foley still in place.  Objective: BP 116/73 mmHg  Pulse 93  Temp(Src) 97.8 F (36.6 C) (Oral)  Resp 18  Ht 5\' 2"  (1.575 m)  Wt 173 lb (78.472 kg)  BMI 31.63 kg/m2  SpO2 74%  LMP 03/09/2013      FHT: Category 1 UC:   irregular, every 3-6 minutes SVE:   Deferred at present  Pitocin at 7 mu/min  Assessment:  Induction, latent phase  Plan: Continue current plan of care.  Nigel BridgemanLATHAM, [name] [name] CNM 11/25/2013, 11:23 AM

## 2013-11-25 NOTE — Progress Notes (Addendum)
  Subjective: Some cramping, but bearable. FOB at bedside.  Objective: BP 106/66 mmHg  Pulse 80  Temp(Src) 97.8 F (36.6 C) (Oral)  Resp 18  Ht 5\' 2"  (1.575 m)  Wt 173 lb (78.472 kg)  BMI 31.63 kg/m2  SpO2 74%  LMP 03/09/2013     FHT: BL 135 w/ mod variability, +accels, no decels UC:   none SVE: Deferred    Foley bulb taut  Assessment:  IUP at 37.2 wks GBS pos  Plan: Dr. Richardson Doppole updated; reviewed strip Repeat CBC this am Restart Pitocin  Sherre ScarletWILLIAMS, Aj Crunkleton CNM 11/25/2013, 6:53 AM

## 2013-11-26 ENCOUNTER — Encounter (HOSPITAL_COMMUNITY): Payer: Self-pay | Admitting: *Deleted

## 2013-11-26 LAB — CBC
HEMATOCRIT: 31.5 % — AB (ref 36.0–46.0)
Hemoglobin: 10.7 g/dL — ABNORMAL LOW (ref 12.0–15.0)
MCH: 29 pg (ref 26.0–34.0)
MCHC: 34 g/dL (ref 30.0–36.0)
MCV: 85.4 fL (ref 78.0–100.0)
Platelets: 68 10*3/uL — ABNORMAL LOW (ref 150–400)
RBC: 3.69 MIL/uL — ABNORMAL LOW (ref 3.87–5.11)
RDW: 14.2 % (ref 11.5–15.5)
WBC: 10.4 10*3/uL (ref 4.0–10.5)

## 2013-11-26 NOTE — Plan of Care (Signed)
Problem: Phase II Progression Outcomes Goal: Pain controlled on oral analgesia Outcome: Completed/Met Date Met:  11/26/13 Pain controlled on po pain medication. Goal: Progress activity as tolerated unless otherwise ordered Outcome: Completed/Met Date Met:  11/26/13 Tolerates ambulating well. Goal: Tolerating diet Outcome: Completed/Met Date Met:  11/26/13

## 2013-11-26 NOTE — Progress Notes (Signed)
Subjective: Postpartum Day 1: Vaginal delivery, right perineal laceration--IUGR Patient up ad lib, reports no syncope or dizziness. Baby in NICU, doing well. Feeding:  Pumping for breastmilk Contraceptive plan:  Likely condoms at present  Objective: Vital signs in last 24 hours: Temp:  [97.6 F (36.4 C)-98.9 F (37.2 C)] 98.9 F (37.2 C) (11/15 0614) Pulse Rate:  [57-108] 71 (11/15 0614) Resp:  [16-20] 18 (11/15 0614) BP: (87-125)/(49-85) 108/65 mmHg (11/15 0614) SpO2:  [99 %-100 %] 100 % (11/15 [id number])  Physical Exam:  General: alert Lochia: appropriate Uterine Fundus: firm Perineum: healing well DVT Evaluation: No evidence of DVT seen on physical exam. Negative Homan's sign.    Recent Labs  11/25/13 1955 11/26/13 0531  HGB 11.6* 10.7*  HCT 34.0* 31.5*   CBC Latest Ref Rng 11/26/2013 11/25/2013 11/25/2013  WBC 4.0 - 10.5 K/uL 10.4 10.9(H) 11.5(H)  Hemoglobin 12.0 - 15.0 g/dL 10.7(L) 11.6(L) 12.3  Hematocrit 36.0 - 46.0 % 31.5(L) 34.0(L) 36.8  Platelets 150 - 400 K/uL 68(L) 79(L) 99(L)     Assessment/Plan: Status post vaginal delivery day 1 Thrombocytopenia--max during hospitalization = 99 before delivery NICU infant. Stable Continue current care. Plan for discharge tomorrow  Repeat CBC tomorrow. Will continue Ibuprophen unless bleeding increases.    Nyra CapesLATHAM, VICKICNM 11/26/2013, 10:56 AM

## 2013-11-26 NOTE — Lactation Note (Signed)
This note was copied from the chart of [name] [name]. Lactation Consultation Note  Provided mother with soap and labels. Mother has been pumping every 3 hours. Discussed massaging and hand expressing before and after pumping. Mother had question about areola swelling and explained it will go down when other swollen areas of her body decrease. Edema around areola happens when mothers get lots of IV fluids. Praised her for being committed to pumping.   Patient Name: [name] Towanda Malkinakeeia Schommer ZOXWR'UToday's Date: 11/26/2013 Reason for consult: Initial assessment   Maternal Data Has patient been taught Hand Expression?: Yes  Feeding    LATCH Score/Interventions                      Lactation Tools Discussed/Used     Consult Status Consult Status: Follow-up Date: 11/27/13 Follow-up type: In-patient    Dahlia ByesBerkelhammer, Crystalmarie Yasin [facility] 11/26/2013, 2:50 PM

## 2013-11-26 NOTE — Plan of Care (Signed)
Problem: Phase I Progression Outcomes Goal: Pain controlled with appropriate interventions Outcome: Completed/Met Date Met:  11/26/13 Good pain control with po Motrin. Goal: Voiding adequately Outcome: Completed/Met Date Met:  11/26/13 Voided large amount of urine. Goal: OOB as tolerated unless otherwise ordered Outcome: Completed/Met Date Met:  11/26/13 Tolerated going to NICU well. Goal: IS, TCDB as ordered Outcome: Not Applicable Date Met:  92/65/99 Goal: Initial discharge plan identified Outcome: Completed/Met Date Met:  11/26/13 VSS Vaginal bleeding WNL Pain controlled Understands self care Understands when to call MD and f/u care    Goal: Other Phase I Outcomes/Goals Outcome: Completed/Met Date Met:  11/26/13 Voided without any difficulty

## 2013-11-26 NOTE — Discharge Summary (Signed)
Vaginal Delivery Discharge Summary  [name] information will be verified prior to discharge  [name] [name]  DOB:    1985/01/24 MRN:    [id number] CSN:    [id number]  Date of admission:                  11/23/13  Date of discharge:                   11/27/13  Procedures this admission: IOL/SVD  Date of Delivery: 11/25/13  Newborn Data:  Live born  Information for the patient's newborn:  Athea, Haley [[id number]]  female   Live born female  Birth Weight: 4 lb 0.2 oz (1820 g) APGAR: 9, 9  Infant in NIUC. Name: AJ Circumcision Plan: OP  History of Present Illness: [name] [name] is a 28 y.o. female, G1P1001, who presents at [redacted]w[redacted]d weeks gestation. The patient has been followed at the [facility] and Gynecology division of [facility]. She was admitted induction of labor. Her pregnancy has been complicated by:  Patient Active Problem List   Diagnosis Date Noted  . Vaginal delivery 11/25/2013  . Thrombocytopenia 11/23/2013  . Abnormal antenatal AFP screen 11/23/2013  . Thrombocytopenia, unspecified--platelets 109 09/24/13 09/24/2013  . Echogenic intracardiac focus of fetus on prenatal ultrasound 09/24/2013  . IUGR (intrauterine growth restriction) 09/22/2013  . 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 GONORRHEA 02/17/2010  . TOBACCO ABUSE 11/05/2009  . GERD 09/17/2009    Hospital course: The patient was admitted for IOL.   Her labor was not complicated. She proceeded to have a vaginal delivery of a healthy infant. Her delivery was not complicated. Her postpartum course was not complicated. She was discharged to home on postpartum day 2 doing well.  Feeding: breast  Contraception: condoms Pt understands the risks birth control are not limited to irregular bleeding, formation of DVT, fluid fluctuations, elevation in blood pressure, stroke, breast tenderness and liver damage.  She states she will report any serious side effects.  She has  been given verbal and written instructions and voiced a clear understanding.    Discharge hemoglobin: HEMOGLOBIN  Date Value Ref Range Status  11/26/2013 10.7* 12.0 - 15.0 g/dL Final   HCT  Date Value Ref Range Status  11/26/2013 31.5* 36.0 - 46.0 % Final   .labs PreNatal Labs ABO, Rh: --/--/A POS, A POS (11/12 1931)   Antibody: NEG (11/12 1931) Rubella:   immune RPR: NON REAC (11/12 1931)  HBsAg: Negative (04/22 0000)  HIV: Non-reactive (04/22 0000)  GBS: Positive (11/05 0000)   Results for [name] [name], [name] [name] (MRN [id number]) as of 11/27/2013 09:26  Ref. Range 11/24/2013 06:50 11/24/2013 13:31 11/24/2013 19:35 11/25/2013 07:05 11/25/2013 13:54 11/25/2013 19:55 11/26/2013 05:31 11/27/2013 08:04  WBC Latest Range: 4.0-10.5 K/uL 12.4 (H) 11.8 (H) 11.8 (H) 9.9 11.5 (H) 10.9 (H) 10.4 8.7  RBC Latest Range: 3.87-5.11 MIL/uL 3.97 4.07 4.02 3.94 4.28 4.00 3.69 (L) 3.44 (L)  Hemoglobin Latest Range: 12.0-15.0 g/dL 29.5 (L) 18.8 (L) 41.6 (L) 11.3 (L) 12.3 11.6 (L) 10.7 (L) 9.7 (L)  HCT Latest Range: 36.0-46.0 % 33.6 (L) 34.5 (L) 34.1 (L) 33.6 (L) 36.8 34.0 (L) 31.5 (L) 29.6 (L)  MCV Latest Range: 78.0-100.0 fL 84.6 84.8 84.8 85.3 86.0 85.0 85.4 86.0  MCH Latest Range: 26.0-34.0 pg 28.5 28.7 28.6 28.7 28.7 29.0 29.0 28.2  MCHC Latest Range: 30.0-36.0 g/dL 60.6 30.1 60.1 09.3 23.5 34.1 34.0 32.8  RDW Latest Range: 11.5-15.5 % 14.1 14.1  14.1 14.1 14.3 14.1 14.2 14.0  Platelets Latest Range: 150-400 K/uL 79 (L) 83 (L) 79 (L) 86 (L) 99 (L) 79 (L) 68 (L) 81 (L)   Discharge Physical Exam:  General: alert and cooperative Lochia: appropriate Uterine Fundus: firm Incision: healing well DVT Evaluation: No evidence of DVT seen on physical exam.  Intrapartum Procedures: spontaneous vaginal delivery and GBS prophylaxis Postpartum Procedures: none Complications-Operative and Postpartum: left peri ureteral   Discharge Diagnoses: Term Pregnancy-delivered, IUGR, thrombocytopenia  Activity:            pelvic rest Diet:                routine Medications: PNV, Ibuprofen, Iron and Percocet Condition:      stable     Postpartum Teaching: Nutrition, exercise, return to work or school, family visits, sexual activity, home rest, vaginal bleeding, pelvic rest, family planning, s/s of PPD, breast care peri-care and incision care   Discharge to: home  Follow-up Information    Follow up with [facility]. Schedule an appointment as soon as possible for a visit in 6 weeks.   Specialty:  Obstetrics and Gynecology   Why:  Postpartum check up   Contact information:   [address] [place] [address] [phone]       Julieta Rogalski, CNM, MSN 11/26/2013. 11:38 PM   Postpartum Care After Vaginal Delivery  After you deliver your newborn (postpartum period), the usual stay in the hospital is 24 72 hours. If there were problems with your labor or delivery, or if you have other medical problems, you might be in the hospital longer.  While you are in the hospital, you will receive help and instructions on how to care for yourself and your newborn during the postpartum period.  While you are in the hospital:  Be sure to tell your nurses if you have pain or discomfort, as well as where you feel the pain and what makes the pain worse.  If you had an incision made near your vagina (episiotomy) or if you had some tearing during delivery, the nurses may put ice packs on your episiotomy or tear. The ice packs may help to reduce the pain and swelling.  If you are breastfeeding, you may feel uncomfortable contractions of your uterus for a couple of weeks. This is normal. The contractions help your uterus get back to normal size.  It is normal to have some bleeding after delivery.  For the first 1 3 days after delivery, the flow is red and the amount may be similar to a period.  It is common for the flow to start and stop.  In the  first few days, you may pass some small clots. Let your nurses know if you begin to pass large clots or your flow increases.  Do not  flush blood clots down the toilet before having the nurse look at them.  During the next 3 10 days after delivery, your flow should become more watery and pink or brown-tinged in color.  Ten to fourteen days after delivery, your flow should be a small amount of yellowish-white discharge.  The amount of your flow will decrease over the first few weeks after delivery. Your flow may stop in 6 8 weeks. Most women have had their flow stop by 12 weeks after delivery.  You should change your sanitary pads frequently.  Wash your hands thoroughly with soap and water for at least 20 seconds after  changing pads, using the toilet, or before holding or feeding your newborn.  You should feel like you need to empty your bladder within the first 6 8 hours after delivery.  In case you become weak, lightheaded, or faint, call your nurse before you get out of bed for the first time and before you take a shower for the first time.  Within the first few days after delivery, your breasts may begin to feel tender and full. This is called engorgement. Breast tenderness usually goes away within 48 72 hours after engorgement occurs. You may also notice milk leaking from your breasts. If you are not breastfeeding, do not stimulate your breasts. Breast stimulation can make your breasts produce more milk.  Spending as much time as possible with your newborn is very important. During this time, you and your newborn can feel close and get to know each other. Having your newborn stay in your room (rooming in) will help to strengthen the bond with your newborn. It will give you time to get to know your newborn and become comfortable caring for your newborn.  Your hormones change after delivery. Sometimes the hormone changes can temporarily cause you to feel sad or tearful. These feelings should  not last more than a few days. If these feelings last longer than that, you should talk to your caregiver.  If desired, talk to your caregiver about methods of family planning or contraception.  Talk to your caregiver about immunizations. Your caregiver may want you to have the following immunizations before leaving the hospital:  Tetanus, diphtheria, and pertussis (Tdap) or tetanus and diphtheria (Td) immunization. It is very important that you and your family (including grandparents) or others caring for your newborn are up-to-date with the Tdap or Td immunizations. The Tdap or Td immunization can help protect your newborn from getting ill.  Rubella immunization.  Varicella (chickenpox) immunization.  Influenza immunization. You should receive this annual immunization if you did not receive the immunization during your pregnancy. Document Released: 10/26/2006 Document Revised: 09/23/2011 Document Reviewed: 08/26/2011 [facility] Patient Information 2014 [employer], [place].   Postpartum Depression and Baby Blues  The postpartum period begins right after the birth of a baby. During this time, there is often a great amount of joy and excitement. It is also a time of considerable changes in the life of the parent(s). Regardless of how many times a mother gives birth, each child brings new challenges and dynamics to the family. It is not unusual to have feelings of excitement accompanied by confusing shifts in moods, emotions, and thoughts. [name] mothers are at risk of developing postpartum depression or the "baby blues." These mood changes can occur right after giving birth, or they may occur many months after giving birth. The baby blues or postpartum depression can be mild or severe. Additionally, postpartum depression can resolve rather quickly, or it can be a long-term condition. CAUSES Elevated hormones and their rapid decline are thought to be a main cause of postpartum depression and the baby  blues. There are a number of hormones that radically change during and after pregnancy. Estrogen and progesterone usually decrease immediately after delivering your baby. The level of thyroid hormone and various cortisol steroids also rapidly drop. Other factors that play a major role in these changes include major life events and genetics.  RISK FACTORS If you have any of the following risks for the baby blues or postpartum depression, know what symptoms to watch out for during the postpartum period. Risk  factors that may increase the likelihood of getting the baby blues or postpartum depression include: 1. Havinga personal or family history of depression. 2. Having depression while being pregnant. 3. Having premenstrual or oral contraceptive-associated mood issues. 4. Having exceptional life stress. 5. Having marital conflict. 6. Lacking a social support network. 7. Having a baby with special needs. 8. Having health problems such as diabetes. SYMPTOMS Baby blues symptoms include:  Brief fluctuations in mood, such as going from extreme happiness to sadness.  Decreased concentration.  Difficulty sleeping.  Crying spells, tearfulness.  Irritability.  Anxiety. Postpartum depression symptoms typically begin within the first month after giving birth. These symptoms include:  Difficulty sleeping or excessive sleepiness.  Marked weight loss.  Agitation.  Feelings of worthlessness.  Lack of interest in activity or food. Postpartum psychosis is a very concerning condition and can be dangerous. Fortunately, it is rare. Displaying any of the following symptoms is cause for immediate medical attention. Postpartum psychosis symptoms include:  Hallucinations and delusions.  Bizarre or disorganized behavior.  Confusion or disorientation. DIAGNOSIS  A diagnosis is made by an evaluation of your symptoms. There are no medical or lab tests that lead to a diagnosis, but there are various  questionnaires that a caregiver may use to identify those with the baby blues, postpartum depression, or psychosis. Often times, a screening tool called the [place] Postnatal Depression Scale is used to diagnose depression in the postpartum period.  TREATMENT The baby blues usually goes away on its own in 1 to 2 weeks. Social support is often [name] that is needed. You should be encouraged to get adequate sleep and rest. Occasionally, you may be given medicines to help you sleep.  Postpartum depression requires treatment as it can last several months or longer if it is not treated. Treatment may include individual or group therapy, medicine, or both to address any social, physiological, and psychological factors that may play a role in the depression. Regular exercise, a healthy diet, rest, and social support may also be strongly recommended.  Postpartum psychosis is more serious and needs treatment right away. Hospitalization is often needed. HOME CARE INSTRUCTIONS  Get as much rest as you can. Nap when the baby sleeps.  Exercise regularly. Some women find yoga and walking to be beneficial.  Eat a balanced and nourishing diet.  Do little things that you enjoy. Have a cup of tea, take a bubble bath, read your favorite magazine, or listen to your favorite music.  Avoid alcohol.  Ask for help with household chores, cooking, grocery shopping, or running errands as needed. Do not try to do everything.  Talk to people close to you about how you are feeling. Get support from your partner, family members, friends, or other new moms.  Try to stay positive in how you think. Think about the things you are grateful for.  Do not spend a lot of time alone.  Only take medicine as directed by your caregiver.  Keep [name] your postpartum appointments.  Let your caregiver know if you have any concerns. SEEK MEDICAL CARE IF: You are having a reaction or problems with your medicine. SEEK IMMEDIATE MEDICAL  CARE IF:  You have suicidal feelings.  You feel you may harm the baby or someone else. Document Released: 10/03/2003 Document Revised: 03/23/2011 Document Reviewed: 11/04/2010 [facility] Patient Information 2014 [place], [place].     Breastfeeding Deciding to breastfeed is one of the best choices you can make for you and your baby. A change in  hormones during pregnancy causes your breast tissue to grow and increases the number and size of your milk ducts. These hormones also allow proteins, sugars, and fats from your blood supply to make breast milk in your milk-producing glands. Hormones prevent breast milk from being released before your baby is born as well as prompt milk flow after birth. Once breastfeeding has begun, thoughts of your baby, as well as his or her sucking or crying, can stimulate the release of milk from your milk-producing glands.  BENEFITS OF BREASTFEEDING For Your Baby  Your first milk (colostrum) helps your baby's digestive system function better.   There are antibodies in your milk that help your baby fight off infections.   Your baby has a lower incidence of asthma, allergies, and sudden infant death syndrome.   The nutrients in breast milk are better for your baby than infant formulas and are designed uniquely for your baby's needs.   Breast milk improves your baby's brain development.   Your baby is less likely to develop other conditions, such as childhood obesity, asthma, or type 2 diabetes mellitus.  For You   Breastfeeding helps to create a very special bond between you and your baby.   Breastfeeding is convenient. Breast milk is always available at the correct temperature and costs nothing.   Breastfeeding helps to burn calories and helps you lose the weight gained during pregnancy.   Breastfeeding makes your uterus contract to its prepregnancy size faster and slows bleeding (lochia) after you give birth.   Breastfeeding helps to lower your  risk of developing type 2 diabetes mellitus, osteoporosis, and breast or ovarian cancer later in life. SIGNS THAT YOUR BABY IS HUNGRY Early Signs of Hunger  Increased alertness or activity.  Stretching.  Movement of the head from side to side.  Movement of the head and opening of the mouth when the corner of the mouth or cheek is stroked (rooting).  Increased sucking sounds, smacking lips, cooing, sighing, or squeaking.  Hand-to-mouth movements.  Increased sucking of fingers or hands. Late Signs of Hunger  Fussing.  Intermittent crying. Extreme Signs of Hunger Signs of extreme hunger will require calming and consoling before your baby will be able to breastfeed successfully. Do not wait for the following signs of extreme hunger to occur before you initiate breastfeeding:   Restlessness.  A loud, strong cry.   Screaming.   BREASTFEEDING BASICS Breastfeeding Initiation  Find a comfortable place to sit or lie down, with your neck and back well supported.  Place a pillow or rolled up blanket under your baby to bring him or her to the level of your breast (if you are seated). Nursing pillows are specially designed to help support your arms and your baby while you breastfeed.  Make sure that your baby's abdomen is facing your abdomen.   Gently massage your breast. With your fingertips, massage from your chest wall toward your nipple in a circular motion. This encourages milk flow. You may need to continue this action during the feeding if your milk flows slowly.  Support your breast with 4 fingers underneath and your thumb above your nipple. Make sure your fingers are well away from your nipple and your baby's mouth.   Stroke your baby's lips gently with your finger or nipple.   When your baby's mouth is open wide enough, quickly bring your baby to your breast, placing your entire nipple and as much of the colored area around your nipple (areola) as possible into  your  baby's mouth.   More areola should be visible above your baby's upper lip than below the lower lip.   Your baby's tongue should be between his or her lower gum and your breast.   Ensure that your baby's mouth is correctly positioned around your nipple (latched). Your baby's lips should create a seal on your breast and be turned out (everted).  It is common for your baby to suck about 2-3 minutes in order to start the flow of breast milk. Latching Teaching your baby how to latch on to your breast properly is very important. An improper latch can cause nipple pain and decreased milk supply for you and poor weight gain in your baby. Also, if your baby is not latched onto your nipple properly, he or she may swallow some air during feeding. This can make your baby fussy. Burping your baby when you switch breasts during the feeding can help to get rid of the air. However, teaching your baby to latch on properly is still the best way to prevent fussiness from swallowing air while breastfeeding. Signs that your baby has successfully latched on to your nipple:    Silent tugging or silent sucking, without causing you pain.   Swallowing heard between every 3-4 sucks.    Muscle movement above and in front of his or her ears while sucking.  Signs that your baby has not successfully latched on to nipple:   Sucking sounds or smacking sounds from your baby while breastfeeding.  Nipple pain. If you think your baby has not latched on correctly, slip your finger into the corner of your baby's mouth to break the suction and place it between your baby's gums. Attempt breastfeeding initiation again. Signs of Successful Breastfeeding Signs from your baby:   A gradual decrease in the number of sucks or complete cessation of sucking.   Falling asleep.   Relaxation of his or her body.   Retention of a small amount of milk in his or her mouth.   Letting go of your breast by himself or  herself. Signs from you:  Breasts that have increased in firmness, weight, and size 1-3 hours after feeding.   Breasts that are softer immediately after breastfeeding.  Increased milk volume, as well as a change in milk consistency and color by the fifth day of breastfeeding.   Nipples that are not sore, cracked, or bleeding. Signs That Your Pecola Leisure is Getting Enough Milk  Wetting at least 3 diapers in a 24-hour period. The urine should be clear and pale yellow by age 86 days.  At least 3 stools in a 24-hour period by age 86 days. The stool should be soft and yellow.  At least 3 stools in a 24-hour period by age 64 days. The stool should be seedy and yellow.  No loss of weight greater than 10% of birth weight during the first 26 days of age.  Average weight gain of 4-7 ounces (113-198 g) per week after age 251 days.  Consistent daily weight gain by age 86 days, without weight loss after the age of 2 weeks. After a feeding, your baby may spit up a small amount. This is common. BREASTFEEDING FREQUENCY AND DURATION Frequent feeding will help you make more milk and can prevent sore nipples and breast engorgement. Breastfeed when you feel the need to reduce the fullness of your breasts or when your baby shows signs of hunger. This is called "breastfeeding on demand." Avoid introducing a pacifier to  your baby while you are working to establish breastfeeding (the first 4-6 weeks after your baby is born). After this time you may choose to use a pacifier. Research has shown that pacifier use during the first year of a baby's life decreases the risk of sudden infant death syndrome (SIDS). Allow your baby to feed on each breast as long as he or she wants. Breastfeed until your baby is finished feeding. When your baby unlatches or falls asleep while feeding from the first breast, offer the second breast. Because newborns are often sleepy in the first few weeks of life, you may need to awaken your baby to get  him or her to feed. Breastfeeding times will vary from baby to baby. However, the following rules can serve as a guide to help you ensure that your baby is properly fed:  Newborns (babies 83 weeks of age or younger) may breastfeed every 1-3 hours.  Newborns should not go longer than 3 hours during the day or 5 hours during the night without breastfeeding.  You should breastfeed your baby a minimum of 8 times in a 24-hour period until you begin to introduce solid foods to your baby at around 13 months of age. BREAST MILK PUMPING Pumping and storing breast milk allows you to ensure that your baby is exclusively fed your breast milk, even at times when you are unable to breastfeed. This is especially important if you are going back to work while you are still breastfeeding or when you are not able to be present during feedings. Your lactation consultant can give you guidelines on how long it is safe to store breast milk.  A breast pump is a machine that allows you to pump milk from your breast into a sterile bottle. The pumped breast milk can then be stored in a refrigerator or freezer. Some breast pumps are operated by hand, while others use electricity. Ask your lactation consultant which type will work best for you. Breast pumps can be purchased, but some hospitals and breastfeeding support groups lease breast pumps on a monthly basis. A lactation consultant can teach you how to hand express breast milk, if you prefer not to use a pump.  CARING FOR YOUR BREASTS WHILE YOU BREASTFEED Nipples can become dry, cracked, and sore while breastfeeding. The following recommendations can help keep your breasts moisturized and healthy:  Avoid using soap on your nipples.   Wear a supportive bra. Although not required, special nursing bras and tank tops are designed to allow access to your breasts for breastfeeding without taking off your entire bra or top. Avoid wearing underwire-style bras or extremely tight  bras.  Air dry your nipples for 3-35minutes after each feeding.   Use only cotton bra pads to absorb leaked breast milk. Leaking of breast milk between feedings is normal.   Use lanolin on your nipples after breastfeeding. Lanolin helps to maintain your skin's normal moisture barrier. If you use pure lanolin, you do not need to wash it off before feeding your baby again. Pure lanolin is not toxic to your baby. You may also hand express a few drops of breast milk and gently massage that milk into your nipples and allow the milk to air dry. In the first few weeks after giving birth, some women experience extremely full breasts (engorgement). Engorgement can make your breasts feel heavy, warm, and tender to the touch. Engorgement peaks within 3-5 days after you give birth. The following recommendations can help ease engorgement:  Completely  empty your breasts while breastfeeding or pumping. You may want to start by applying warm, moist heat (in the shower or with warm water-soaked hand towels) just before feeding or pumping. This increases circulation and helps the milk flow. If your baby does not completely empty your breasts while breastfeeding, pump any extra milk after he or she is finished.  Wear a snug bra (nursing or regular) or tank top for 1-2 days to signal your body to slightly decrease milk production.  Apply ice packs to your breasts, unless this is too uncomfortable for you.  Make sure that your baby is latched on and positioned properly while breastfeeding. If engorgement persists after 48 hours of following these recommendations, contact your health care provider or a Advertising copywriter. OVERALL HEALTH CARE RECOMMENDATIONS WHILE BREASTFEEDING  Eat healthy foods. Alternate between meals and snacks, eating 3 of each per day. Because what you eat affects your breast milk, some of the foods may make your baby more irritable than usual. Avoid eating these foods if you are sure that  they are negatively affecting your baby.  Drink milk, fruit juice, and water to satisfy your thirst (about 10 glasses a day).   Rest often, relax, and continue to take your prenatal vitamins to prevent fatigue, stress, and anemia.  Continue breast self-awareness checks.  Avoid chewing and smoking tobacco.  Avoid alcohol and drug use. Some medicines that may be harmful to your baby can pass through breast milk. It is important to ask your health care provider before taking any medicine, including [name] over-the-counter and prescription medicine as well as vitamin and herbal supplements. It is possible to become pregnant while breastfeeding. If birth control is desired, ask your health care provider about options that will be safe for your baby. SEEK MEDICAL CARE IF:   You feel like you want to stop breastfeeding or have become frustrated with breastfeeding.  You have painful breasts or nipples.  Your nipples are cracked or bleeding.  Your breasts are red, tender, or warm.  You have a swollen area on either breast.  You have a fever or chills.  You have nausea or vomiting.  You have drainage other than breast milk from your nipples.  Your breasts do not become full before feedings by the fifth day after you give birth.  You feel sad and depressed.  Your baby is too sleepy to eat well.  Your baby is having trouble sleeping.   Your baby is wetting less than 3 diapers in a 24-hour period.  Your baby has less than 3 stools in a 24-hour period.  Your baby's skin or the white part of his or her eyes becomes yellow.   Your baby is not gaining weight by 35 days of age. SEEK IMMEDIATE MEDICAL CARE IF:   Your baby is overly tired (lethargic) and does not want to wake up and feed.  Your baby develops an unexplained fever. Document Released: 12/29/2004 Document Revised: 01/03/2013 Document Reviewed: 06/22/2012 [facility] Patient Information 2015 [place], [place]. This information  is not intended to replace advice given to you by your health care provider. Make sure you discuss any questions you have with your health care provider.

## 2013-11-27 LAB — CBC
HCT: 29.6 % — ABNORMAL LOW (ref 36.0–46.0)
HEMOGLOBIN: 9.7 g/dL — AB (ref 12.0–15.0)
MCH: 28.2 pg (ref 26.0–34.0)
MCHC: 32.8 g/dL (ref 30.0–36.0)
MCV: 86 fL (ref 78.0–100.0)
Platelets: 81 10*3/uL — ABNORMAL LOW (ref 150–400)
RBC: 3.44 MIL/uL — AB (ref 3.87–5.11)
RDW: 14 % (ref 11.5–15.5)
WBC: 8.7 10*3/uL (ref 4.0–10.5)

## 2013-11-27 MED ORDER — IBUPROFEN 600 MG PO TABS: 600.0000 mg | ORAL_TABLET | Freq: Four times a day (QID) | ORAL | Status: DC

## 2013-11-27 MED ORDER — FERROUS SULFATE 325 (65 FE) MG PO TABS: 325.0000 mg | ORAL_TABLET | Freq: Two times a day (BID) | ORAL | Status: DC

## 2013-11-27 MED ORDER — FERROUS SULFATE 325 (65 FE) MG PO TABS
325.0000 mg | ORAL_TABLET | Freq: Two times a day (BID) | ORAL | Status: DC
  Administered 2013-11-27: 325 mg via ORAL
  Filled 2013-11-27: qty 1

## 2013-11-27 MED ORDER — OXYCODONE-ACETAMINOPHEN 5-325 MG PO TABS: 1.0000 | ORAL_TABLET | ORAL | Status: DC | PRN

## 2013-11-27 NOTE — Plan of Care (Signed)
Problem: Discharge Progression Outcomes Goal: Pain controlled with appropriate interventions Outcome: Completed/Met Date Met:  01-02-14

## 2013-11-27 NOTE — Plan of Care (Signed)
Problem: Discharge Progression Outcomes Goal: Barriers To Progression Addressed/Resolved Outcome: Not Applicable Date Met:  90/09/20

## 2013-11-27 NOTE — Progress Notes (Signed)
Clinical Social Work Department PSYCHOSOCIAL ASSESSMENT - MATERNAL/CHILD 11/27/2013  Patient:  [name] [name],[name] [name]  Account Number:  [id number]  Admit Date:  11/23/2013  Childs Name:   [name] [name]    Clinical Social Worker:  Jeanie Mccard, LCSW   Date/Time:  11/27/2013 11:30 AM  Date Referred:        Other referral source:   No referral-NICU admission    I:  FAMILY / HOME ENVIRONMENT Child's legal guardian:  PARENT  Guardian - Name Guardian - Age Guardian - Address  [name] [name] [address] [place], [place] [address]  [name] [name]  same   Other household support members/support persons Other support:   MOB states she and FOB have a great support system in the area.  She reports her great support people, other than FOB, are her mother, cousin and PGM.    II  PSYCHOSOCIAL DATA Information Source:  Patient Interview  Financial and Community Resources Employment:   MOB was working at [employer].  She does not plan to return to work any time soon.  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   Financial resources:  Private Insurance If Medicaid - County:  [place]  School / Grade:   Maternity Care Coordinator / Child Services Coordination / Early Interventions:  Cultural issues impacting care:   None stated    III  STRENGTHS Strengths  Adequate Resources  Compliance with medical plan  Home prepared for Child (including basic supplies)  Other - See comment  Supportive family/friends  Understanding of illness   Strength comment:  Pediatric follow up will be at [facility]   IV  RISK FACTORS AND CURRENT PROBLEMS Current Problem:  None   Risk Factor & Current Problem Patient Issue Family Issue Risk Factor / Current Problem Comment   N N     V  SOCIAL WORK ASSESSMENT  CSW met with MOB in her third floor room to introduce myself, complete assessment due to baby's admission to NICU at 37.2 weeks and to offer support.  MOB was incredibly pleasant and  welcoming of CSW's visit.  She was alone at this time and states she is doing well.  She was very talkative and open about her feelings related to her son's admission to NICU.  She states she and her husband have a great relationship and talked through each decision every step of the way as they received news that their baby was not growing on schedule.  CSW commended her for not only trusting her doctors' expertise, but also for thinking for herself and making decisions after discussing thoughts and feelings with her husband.  She appears to have a very good understanding of baby's medical situation.  She states she has a great support system and everything ready for baby at home.  CSW provided education on PPD and MOB was very attentive and able to identify times where she has experienced depression.  She states her depressive episodes were mainly focused around the loss of her grandparents.  CSW explained that the symptoms she felt then, (isolation, loss of appetite) may manifest again if PPD symptoms arise.  CSW stressed the importance of speaking with CSW and or her doctor if she has concerns about her emotional health at any time.  She agreed completely.  CSW informed MOB of baby's eligibility for SSI and instructed her on how to apply if she is interested.  She stated understanding and no questions at this time.  CSW identifies no social   concerns at this time and gave contact information, asking MOB to call any time she has questions or feels she would like to process her feelings.  MOB was very Patent attorney.   VI SOCIAL WORK PLAN Social Work Plan  Psychosocial Support/Ongoing Assessment of Needs  Patient/Family Education   Type of pt/family education:   PPD signs and symptoms  Ongoing support services offered by NICU CSW   If child protective services report - county:   If child protective services report - date:   Information/referral to community resources comment:   No referral needs noted  at this time.   Other social work plan:

## 2013-11-27 NOTE — Lactation Note (Signed)
This note was copied from the chart of [name] Towanda Malkinakeeia Mcvicar. Lactation Consultation Note  Mom is obtaining 10-20 mls of colostrum each pumping.  Mom reports feeling relieved.  Discussed supply and demand.  Stressed importance of maintaining pumping every 3 hours.  Mom has a Ameda Pure[employer] Yours at home.  Encouraged mom to put baby to breast and to call for assist prn.  Patient Name: [name] [name] [id number]'s Date: 11/27/2013     Maternal Data    Feeding    LATCH Score/Interventions                      Lactation Tools Discussed/Used     Consult Status      Huston FoleyMOULDEN, [name] 11/27/2013, 11:08 AM

## 2013-11-27 NOTE — Plan of Care (Signed)
Problem: Discharge Progression Outcomes Goal: Tolerating diet Outcome: Completed/Met Date Met:  11/27/13     

## 2013-11-27 NOTE — Plan of Care (Signed)
Problem: Discharge Progression Outcomes Goal: Complications resolved/controlled Outcome: Not Applicable Date Met:  11/27/13     

## 2013-11-27 NOTE — Plan of Care (Signed)
Problem: Discharge Progression Outcomes Goal: Discharge plan in place and appropriate Outcome: Completed/Met Date Met:  11/27/13

## 2013-11-27 NOTE — Plan of Care (Signed)
Problem: Discharge Progression Outcomes Goal: Activity appropriate for discharge plan Outcome: Completed/Met Date Met:  11/27/13     

## 2013-11-27 NOTE — Progress Notes (Signed)
Ur chart review completed.  

## 2013-11-27 NOTE — Plan of Care (Signed)
Problem: Discharge Progression Outcomes Goal: Afebrile, VS remain stable at discharge Outcome: Completed/Met Date Met:  11/27/13

## 2013-11-27 NOTE — Progress Notes (Signed)
Pt is discharged in the care of husband Downstairs per  Ambulatory. Spirits are good. Denies any pain or discomfort. Infant to remain in [place]. Discharge instructions were given to pt.Questions  were asked and answered

## 2016-04-30 DIAGNOSIS — B009 Herpesviral infection, unspecified: Secondary | ICD-10-CM | POA: Insufficient documentation

## 2016-12-17 ENCOUNTER — Other Ambulatory Visit: Payer: Self-pay

## 2016-12-17 ENCOUNTER — Emergency Department (HOSPITAL_COMMUNITY)
Admission: EM | Admit: 2016-12-17 | Discharge: 2016-12-17 | Disposition: A | Discharge status: Home / Self Care | Payer: BLUE CROSS/BLUE SHIELD | Attending: Emergency Medicine | Admitting: Emergency Medicine

## 2016-12-17 ENCOUNTER — Encounter (HOSPITAL_COMMUNITY): Payer: Self-pay

## 2016-12-17 DIAGNOSIS — Z87891 Personal history of nicotine dependence: Secondary | ICD-10-CM | POA: Diagnosis not present

## 2016-12-17 DIAGNOSIS — H109 Unspecified conjunctivitis: Secondary | ICD-10-CM

## 2016-12-17 DIAGNOSIS — Z79899 Other long term (current) drug therapy: Secondary | ICD-10-CM | POA: Diagnosis not present

## 2016-12-17 DIAGNOSIS — H1089 Other conjunctivitis: Secondary | ICD-10-CM | POA: Insufficient documentation

## 2016-12-17 DIAGNOSIS — H579 Unspecified disorder of eye and adnexa: Secondary | ICD-10-CM | POA: Diagnosis present

## 2016-12-17 MED ORDER — TETRACAINE HCL 0.5 % OP SOLN
1.0000 [drp] | Freq: Once | OPHTHALMIC | Status: AC
  Administered 2016-12-17: 1 [drp] via OPHTHALMIC
  Filled 2016-12-17: qty 4

## 2016-12-17 MED ORDER — ERYTHROMYCIN 5 MG/GM OP OINT
TOPICAL_OINTMENT | Freq: Once | OPHTHALMIC | Status: AC
  Administered 2016-12-17: 1 via OPHTHALMIC
  Filled 2016-12-17: qty 3.5

## 2016-12-17 MED ORDER — FLUORESCEIN SODIUM 1 MG OP STRP
1.0000 | ORAL_STRIP | Freq: Once | OPHTHALMIC | Status: AC
  Administered 2016-12-17: 1 via OPHTHALMIC
  Filled 2016-12-17: qty 1

## 2016-12-17 NOTE — ED Provider Notes (Signed)
[facility]-EMERGENCY DEPT Provider Note   CSN: [id number] Arrival date & time: 12/17/16  2034     History   Chief Complaint Chief Complaint  Patient presents with  . Conjunctivitis    HPI [name] [name] is a 31 y.o. female who presents to the ED with possible conjunctivitis. Patient reports that her left eye has drainage and swelling of the upper eyelid. The symptoms started today. Patient does not know of anyone she has been around with pink eye.   The history is provided by the patient. No language interpreter was used.  Conjunctivitis  This is a new problem. The current episode started 6 to 12 hours ago. The problem occurs constantly. The problem has been gradually worsening. Pertinent negatives include no headaches. Nothing aggravates the symptoms. Nothing relieves the symptoms. She has tried nothing for the symptoms.    Past Medical History:  Diagnosis Date  . GERD (gastroesophageal reflux disease)    OTC as needed  . Gonorrhea   . Irregular menses    states only 2 periods/year  . Laceration of right thumb 08/16/2011   right thumb digital nerve lac.; sutures x 8, per pt.  . Trichomonas vaginitis     Patient Active Problem List   Diagnosis Date Noted  . Vaginal delivery 11/25/2013  . Thrombocytopenia (HCC) 11/23/2013  . Abnormal antenatal AFP screen 11/23/2013  . Thrombocytopenia, unspecified--platelets 109 09/24/13 09/24/2013  . Echogenic intracardiac focus of fetus on prenatal ultrasound 09/24/2013  . IUGR (intrauterine growth restriction) 09/22/2013  . 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 GONORRHEA 02/17/2010  . TOBACCO ABUSE 11/05/2009  . GERD 09/17/2009    Past Surgical History:  Procedure Laterality Date  . NERVE REPAIR  08/31/2011   Procedure: NERVE REPAIR;  Surgeon: Tami RibasKevin R Kuzma, MD;  Location: [facility];  Service: Orthopedics;  Laterality: Right;  right thumb digital nerve repair  . NO PAST SURGERIES    . WISDOM TOOTH  EXTRACTION      OB History    Gravida Para Term Preterm AB Living   1 1 1     1    SAB TAB Ectopic Multiple Live Births         0 1       Home Medications    Prior to Admission medications   Medication Sig Start Date End Date Taking? Authorizing Provider  ferrous sulfate 325 (65 FE) MG tablet Take 1 tablet (325 mg total) by mouth 2 (two) times daily with a meal. 11/27/13   Standard, Venus, CNM  ibuprofen (ADVIL,MOTRIN) 600 MG tablet Take 1 tablet (600 mg total) by mouth every 6 (six) hours. 11/27/13   Standard, Venus, CNM  Multiple Vitamins-Minerals (MULTIVITAMIN GUMMIES ADULT PO) Take 2 tablets by mouth daily.    [provider]  oxyCODONE-acetaminophen (PERCOCET/ROXICET) 5-325 MG per tablet Take 1 tablet by mouth every 4 (four) hours as needed (for pain scale less than 7). 11/27/13   Standard, Venus, CNM    Family History Family History  Problem Relation Age of Onset  . Stroke Maternal Grandfather   . Heart disease Maternal Grandfather   . Hearing loss Neg Hx     Social History Social History   Tobacco Use  . Smoking status: Former Smoker    Years: 4.00    Types: Cigarettes    Last attempt to quit: 05/05/2013    Years since quitting: 3.6  . Smokeless tobacco: Never Used  . Tobacco comment: quit with pos preg  Substance  Use Topics  . Alcohol use: No  . Drug use: No     Allergies   Sulfonamide derivatives   Review of Systems Review of Systems  Constitutional: Negative for chills and fever.  HENT: Positive for congestion.   Eyes: Positive for photophobia, discharge, redness and itching. Negative for visual disturbance.  Respiratory: Negative for cough.   Gastrointestinal: Negative for nausea and vomiting.  Musculoskeletal: Negative for neck pain.  Skin: Negative for wound.  Neurological: Negative for headaches.  Psychiatric/Behavioral: Negative for confusion.     Physical Exam Updated Vital Signs BP 123/88 (BP Location: Right Arm)   Pulse 90    Temp 98 F (36.7 C) (Oral)   Resp 18   LMP 12/02/2016   SpO2 100%   Physical Exam  Constitutional: She appears well-developed and well-nourished. No distress.  HENT:  Right Ear: Tympanic membrane normal.  Left Ear: Tympanic membrane normal.  Nose: Rhinorrhea present.  Mouth/Throat: Uvula is midline and oropharynx is clear and moist.  Eyes: EOM are normal. Lids are everted and swept, no foreign bodies found. Left eye exhibits discharge. Left eye exhibits no hordeolum. No foreign body present in the left eye. Left conjunctiva is injected.  Fundoscopic exam:      The left eye shows exudate.  Slit lamp exam:      The right eye shows no fluorescein uptake.       The left eye shows no corneal abrasion, no corneal ulcer, no foreign body and no hyphema.  Left upper lid swollen yellow drainage noted.  Neck: Neck supple.  Cardiovascular: Normal rate.  Pulmonary/Chest: Effort normal.  Musculoskeletal: Normal range of motion.  Neurological: She is alert.  Skin: Skin is warm and dry.  Psychiatric: She has a normal mood and affect. Her behavior is normal.  Nursing note and vitals reviewed.    ED Treatments / Results  Labs (all labs ordered are listed, but only abnormal results are displayed) Labs Reviewed - No data to display   Radiology No results found.  Procedures Procedures (including critical care time)  Medications Ordered in ED Medications  erythromycin ophthalmic ointment (not administered)  tetracaine (PONTOCAINE) 0.5 % ophthalmic solution 1 drop (1 drop Left Eye Given 12/17/16 2206)  fluorescein ophthalmic strip 1 strip (1 strip Left Eye Given by Other 12/17/16 2233)     Initial Impression / Assessment and Plan / ED Course  I have reviewed the triage vital signs and the nursing notes. [name] LineaNakeeia L Meas presents with symptoms consistent with bacterial conjunctivitis.  Purulent discharge exam.  No corneal abrasions, entrapment, consensual photophobia, or dendritic  staining with fluorescein study.  Presentation non-concerning for iritis, corneal abrasions, or HSV.  No evidence of preseptal or orbital cellulitis.  Pt is not a contact lens wearer.  Patient will be given erythromycin ophthalmic.  Personal hygiene and frequent handwashing discussed.  Patient advised to followup with ophthalmologist for reevaluation in several days..  Patient verbalizes understanding and is agreeable with discharge.     Final Clinical Impressions(s) / ED Diagnoses   Final diagnoses:  Bacterial conjunctivitis of left eye    ED Discharge Orders    None       Kerrie Buffaloeese, Finlay Godbee WaynesvilleM, NP 12/17/16 2241    Melene PlanFloyd, Dan, DO 12/17/16 2242

## 2016-12-17 NOTE — Discharge Instructions (Signed)
Use the eye ointment 3 times a day for the next 7 days. Follow up with the eye doctor if symptoms are not improving after the first 24 to 48 hours.

## 2016-12-17 NOTE — ED Notes (Signed)
Visual acuity screening: Right Eye 20/15 Left Eye 20/30 Bilateral Eyes 20/25

## 2016-12-17 NOTE — ED Triage Notes (Signed)
Patient presents with left eye conjunctivitis. Patient reports her eye started getting red, itching, and burning this morning, and has progressively gotten worse throughout the day. Patient denies being around anyone with pinkeye recently, but her young son did get diagnosed with pink eye in the summer. Patient endorses left eye drainage, but denies visual changes.

## 2019-11-05 ENCOUNTER — Other Ambulatory Visit: Payer: Self-pay

## 2019-11-05 ENCOUNTER — Inpatient Hospital Stay (HOSPITAL_COMMUNITY)
Admission: AD | Admit: 2019-11-05 | Discharge: 2019-11-06 | Disposition: A | Discharge status: Home / Self Care | Payer: Medicaid Other | Attending: Emergency Medicine | Admitting: Emergency Medicine

## 2019-11-05 ENCOUNTER — Inpatient Hospital Stay (HOSPITAL_COMMUNITY): Payer: Medicaid Other

## 2019-11-05 ENCOUNTER — Encounter (HOSPITAL_COMMUNITY): Payer: Self-pay | Admitting: Emergency Medicine

## 2019-11-05 DIAGNOSIS — O219 Vomiting of pregnancy, unspecified: Secondary | ICD-10-CM

## 2019-11-05 DIAGNOSIS — Z3491 Encounter for supervision of normal pregnancy, unspecified, first trimester: Secondary | ICD-10-CM

## 2019-11-05 DIAGNOSIS — Z3A01 Less than 8 weeks gestation of pregnancy: Secondary | ICD-10-CM | POA: Diagnosis not present

## 2019-11-05 DIAGNOSIS — O26891 Other specified pregnancy related conditions, first trimester: Secondary | ICD-10-CM | POA: Diagnosis present

## 2019-11-05 DIAGNOSIS — O23591 Infection of other part of genital tract in pregnancy, first trimester: Secondary | ICD-10-CM | POA: Diagnosis not present

## 2019-11-05 DIAGNOSIS — B373 Candidiasis of vulva and vagina: Secondary | ICD-10-CM | POA: Insufficient documentation

## 2019-11-05 DIAGNOSIS — Z87891 Personal history of nicotine dependence: Secondary | ICD-10-CM | POA: Diagnosis not present

## 2019-11-05 DIAGNOSIS — Z3A08 8 weeks gestation of pregnancy: Secondary | ICD-10-CM

## 2019-11-05 DIAGNOSIS — B3731 Acute candidiasis of vulva and vagina: Secondary | ICD-10-CM

## 2019-11-05 DIAGNOSIS — N898 Other specified noninflammatory disorders of vagina: Secondary | ICD-10-CM

## 2019-11-05 DIAGNOSIS — O2341 Unspecified infection of urinary tract in pregnancy, first trimester: Secondary | ICD-10-CM

## 2019-11-05 LAB — CBC WITH DIFFERENTIAL/PLATELET
Abs Immature Granulocytes: 0.03 10*3/uL (ref 0.00–0.07)
Basophils Absolute: 0 10*3/uL (ref 0.0–0.1)
Basophils Relative: 0 %
Eosinophils Absolute: 0.2 10*3/uL (ref 0.0–0.5)
Eosinophils Relative: 2 %
HCT: 35.6 % — ABNORMAL LOW (ref 36.0–46.0)
Hemoglobin: 11.6 g/dL — ABNORMAL LOW (ref 12.0–15.0)
Immature Granulocytes: 0 %
Lymphocytes Relative: 31 %
Lymphs Abs: 3 10*3/uL (ref 0.7–4.0)
MCH: 30.4 pg (ref 26.0–34.0)
MCHC: 32.6 g/dL (ref 30.0–36.0)
MCV: 93.2 fL (ref 80.0–100.0)
Monocytes Absolute: 0.6 10*3/uL (ref 0.1–1.0)
Monocytes Relative: 6 %
Neutro Abs: 6 10*3/uL (ref 1.7–7.7)
Neutrophils Relative %: 61 %
Platelets: 168 10*3/uL (ref 150–400)
RBC: 3.82 MIL/uL — ABNORMAL LOW (ref 3.87–5.11)
RDW: 13.8 % (ref 11.5–15.5)
WBC: 9.8 10*3/uL (ref 4.0–10.5)
nRBC: 0 % (ref 0.0–0.2)

## 2019-11-05 LAB — BASIC METABOLIC PANEL
Anion gap: 7 (ref 5–15)
BUN: 7 mg/dL (ref 6–20)
CO2: 22 mmol/L (ref 22–32)
Calcium: 8.7 mg/dL — ABNORMAL LOW (ref 8.9–10.3)
Chloride: 106 mmol/L (ref 98–111)
Creatinine, Ser: 0.73 mg/dL (ref 0.44–1.00)
GFR, Estimated: >60 mL/min (ref >60)
Glucose, Bld: 91 mg/dL (ref 70–99)
Potassium: 3.4 mmol/L — ABNORMAL LOW (ref 3.5–5.1)
Sodium: 135 mmol/L (ref 135–145)

## 2019-11-05 LAB — HCG, QUANTITATIVE, PREGNANCY: hCG, Beta Chain, Quant, S: 115116 m[IU]/mL — ABNORMAL HIGH (ref <5)

## 2019-11-05 LAB — URINALYSIS, ROUTINE W REFLEX MICROSCOPIC
Bilirubin Urine: NEGATIVE
Glucose, UA: NEGATIVE mg/dL
Hgb urine dipstick: NEGATIVE
Ketones, ur: NEGATIVE mg/dL
Leukocytes,Ua: NEGATIVE
Nitrite: POSITIVE — AB
Protein, ur: NEGATIVE mg/dL
Specific Gravity, Urine: 1.024 (ref 1.005–1.030)
pH: 5 (ref 5.0–8.0)

## 2019-11-05 LAB — I-STAT BETA HCG BLOOD, ED (MC, WL, AP ONLY): I-stat hCG, quantitative: >2000 m[IU]/mL — ABNORMAL HIGH (ref <5)

## 2019-11-05 LAB — WET PREP, GENITAL
Clue Cells Wet Prep HPF POC: NONE SEEN
Sperm: NONE SEEN
Trich, Wet Prep: NONE SEEN
Yeast Wet Prep HPF POC: NONE SEEN

## 2019-11-05 NOTE — ED Provider Notes (Signed)
[facility] EMERGENCY DEPARTMENT Provider Note   CSN: [id number] Arrival date & time: 11/05/19  2009     History Chief Complaint  Patient presents with  . Vaginal Discharge    [name] [name] is a 34 y.o. female.  The history is provided by the patient.  Vaginal Discharge Quality:  Malodorous, thick and white Severity:  Mild Duration:  1 week Timing:  Intermittent Progression:  Unchanged Chronicity:  New Context: after urination   Relieved by:  None tried Worsened by:  Nothing Ineffective treatments:  None tried Associated symptoms: nausea and vomiting   Associated symptoms: no abdominal pain, no dysuria, no fever and no urinary frequency   Risk factors comment:  Pregnant      Past Medical History:  Diagnosis Date  . GERD (gastroesophageal reflux disease)    OTC as needed  . Gonorrhea   . Irregular menses    states only 2 periods/year  . Laceration of right thumb 08/16/2011   right thumb digital nerve lac.; sutures x 8, per pt.  . Trichomonas vaginitis     Patient Active Problem List   Diagnosis Date Noted  . Vaginal delivery 11/25/2013  . Thrombocytopenia (HCC) 11/23/2013  . Abnormal antenatal AFP screen 11/23/2013  . Thrombocytopenia, unspecified--platelets 109 09/24/13 09/24/2013  . Echogenic intracardiac focus of fetus on prenatal ultrasound 09/24/2013  . IUGR (intrauterine growth restriction) 09/22/2013  . 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 GONORRHEA 02/17/2010  . TOBACCO ABUSE 11/05/2009  . GERD 09/17/2009    Past Surgical History:  Procedure Laterality Date  . NERVE REPAIR  08/31/2011   Procedure: NERVE REPAIR;  Surgeon: Tami Ribas, MD;  Location: [facility];  Service: Orthopedics;  Laterality: Right;  right thumb digital nerve repair  . NO PAST SURGERIES    . WISDOM TOOTH EXTRACTION       OB History    Gravida  1   Para  1   Term  1   Preterm      AB      Living  1     SAB      TAB      Ectopic       Multiple  0   Live Births  1           Family History  Problem Relation Age of Onset  . Stroke Maternal Grandfather   . Heart disease Maternal Grandfather   . Hearing loss Neg Hx     Social History   Tobacco Use  . Smoking status: Former Smoker    Years: 4.00    Types: Cigarettes    Quit date: 05/05/2013    Years since quitting: 6.5  . Smokeless tobacco: Never Used  . Tobacco comment: quit with pos preg  Substance Use Topics  . Alcohol use: No  . Drug use: No    Home Medications Prior to Admission medications   Medication Sig Start Date End Date Taking? Authorizing Provider  ferrous sulfate 325 (65 FE) MG tablet Take 1 tablet (325 mg total) by mouth 2 (two) times daily with a meal. 11/27/13   Standard, Venus, CNM  ibuprofen (ADVIL,MOTRIN) 600 MG tablet Take 1 tablet (600 mg total) by mouth every 6 (six) hours. 11/27/13   Standard, Venus, CNM  Multiple Vitamins-Minerals (MULTIVITAMIN GUMMIES ADULT PO) Take 2 tablets by mouth daily.    [provider]  oxyCODONE-acetaminophen (PERCOCET/ROXICET) 5-325 MG per tablet Take 1 tablet by mouth every 4 (four) hours as  needed (for pain scale less than 7). 11/27/13   Standard, Venus, CNM    Allergies    Sulfonamide derivatives  Review of Systems   Review of Systems  Constitutional: Negative for chills and fever.  HENT: Negative for congestion and sore throat.   Eyes: Negative for visual disturbance.  Respiratory: Negative for cough and shortness of breath.   Cardiovascular: Negative for chest pain and leg swelling.  Gastrointestinal: Positive for nausea and vomiting. Negative for abdominal pain and diarrhea.  Genitourinary: Positive for vaginal discharge. Negative for difficulty urinating, dysuria, hematuria, pelvic pain, vaginal bleeding and vaginal pain.  Musculoskeletal: Negative for neck pain.  Skin: Negative for rash.  Neurological: Negative for headaches.  All other systems reviewed and are  negative.   Physical Exam Updated Vital Signs BP 117/75   Pulse 76   Temp 98.8 F (37.1 C)   Resp 16   Ht 5\' 2"  (1.575 m)   Wt 75 kg   LMP 09/08/2019   SpO2 100%   BMI 30.24 kg/m   Physical Exam Vitals reviewed.  Constitutional:      Appearance: Normal appearance.  HENT:     Head: Normocephalic and atraumatic.     Nose: Nose normal.     Mouth/Throat:     Mouth: Mucous membranes are moist.     Pharynx: Oropharynx is clear.  Eyes:     Conjunctiva/sclera: Conjunctivae normal.  Cardiovascular:     Rate and Rhythm: Normal rate.     Heart sounds: Normal heart sounds.  Pulmonary:     Effort: Pulmonary effort is normal.     Breath sounds: Normal breath sounds.  Abdominal:     General: Abdomen is flat.     Palpations: Abdomen is soft.     Tenderness: There is no abdominal tenderness.  Musculoskeletal:     Cervical back: Neck supple.     Right lower leg: No edema.     Left lower leg: Edema present.  Skin:    General: Skin is warm and dry.     Capillary Refill: Capillary refill takes less than 2 seconds.  Neurological:     Mental Status: She is alert.  Psychiatric:        Mood and Affect: Mood normal.        Behavior: Behavior normal.     ED Results / Procedures / Treatments   Labs (all labs ordered are listed, but only abnormal results are displayed) Labs Reviewed  CBC WITH DIFFERENTIAL/PLATELET - Abnormal; Notable for the following components:      Result Value   RBC 3.82 (*)    Hemoglobin 11.6 (*)    HCT 35.6 (*)    All other components within normal limits  BASIC METABOLIC PANEL - Abnormal; Notable for the following components:   Potassium 3.4 (*)    Calcium 8.7 (*)    All other components within normal limits  URINALYSIS, ROUTINE W REFLEX MICROSCOPIC - Abnormal; Notable for the following components:   APPearance HAZY (*)    Nitrite POSITIVE (*)    Bacteria, UA FEW (*)    All other components within normal limits  I-STAT BETA HCG BLOOD, ED ([facility], [facility], [facility]  ONLY) - Abnormal; Notable for the following components:   I-stat hCG, quantitative >2,000.0 (*)    All other components within normal limits    EKG None  Radiology No results found.  Procedures Procedures (including critical care time)  Medications Ordered in ED Medications - No data to display  ED Course  I have reviewed the triage vital signs and the nursing notes.  Pertinent labs & imaging results that were available during my care of the patient were reviewed by me and considered in my medical decision making (see chart for details).    MDM Rules/Calculators/A&P                          Medical Decision Making: IVETT LUEBBE is a 34 y.o. female who presented to the ED today with vaginal discharge. Pt concerned about STDs, reports multiple partners. Pt reports she has had more nausea and vomiting after some meals for 3 weeks. Pt denies abdominal pain, diarrhea, fever or changes in urination. Pt reports she is not on birth control.  Pt was not aware of her current pregnancy, informed of positive blood HCG today. Unclear dates at this time, suspect ~5 weeks.  Past medical history significant for GERD, past hx gonorrhea and trichomonas Reviewed and confirmed nursing documentation for past medical history, family history, social history. UA with nitrites, no leuks, few bacteria.   On my initial exam, the pt was in NAD, soft non tender abdomen   Pt will need pelvic exam with GC/CHlamydia and wet prep.  Will [place] to confirm IUP. Will need follow up for dating and prenatal care as indicated.   All radiology and laboratory studies reviewed independently and with my attending physician, agree with reading provided by radiologist unless otherwise noted.  Based on the above findings, I believe patient is hemodynamically stable for evaluation in the MAU by OB/GYN provider.  Handoff given to provider, pt transported in stable condition for remainder of care see provider note.   The  above care was discussed with and agreed upon by my attending physician. Emergency Department Medication Summary:  Medications - No data to display     Final Clinical Impression(s) / ED Diagnoses Final diagnoses:  Vaginal discharge during pregnancy in first trimester    Rx / DC Orders ED Discharge Orders    None       Brantley Fling, MD 11/05/19 2158    Lorre Nick, MD 11/05/19 2206

## 2019-11-05 NOTE — MAU Provider Note (Signed)
History     CSN: [id number]  Arrival date and time: 11/05/19 2009   First Provider Initiated Contact with Patient 11/05/19 2301      Chief Complaint  Patient presents with  . Vaginal Discharge  . Abdominal Pain   HPI  [name] [name] is a 34 y.o. G2P1001 at [redacted]w[redacted]d who presents with nausea, abdominal pain, and vaginal discharge.  Thick, clumpy vaginal discharge for the last 4 days. No odor. Mild irritation. No vaginal bleeding.  Has had nausea this week & has vomited 4 times today. Does not have antiemetic at home. Reports pain in her lower abdomen when she vomits. Denies fever/chills, diarrhea, dysuria. Plans on going to [facility] but hasn't scheduled appt with them yet & hasn't seen them in over 2 years.   OB History    Gravida  2   Para  1   Term  1   Preterm      AB      Living  1     SAB      TAB      Ectopic      Multiple  0   Live Births  1           Past Medical History:  Diagnosis Date  . GERD (gastroesophageal reflux disease)    OTC as needed  . Gonorrhea   . Irregular menses    states only 2 periods/year  . Laceration of right thumb 08/16/2011   right thumb digital nerve lac.; sutures x 8, per pt.  . Trichomonas vaginitis     Past Surgical History:  Procedure Laterality Date  . NERVE REPAIR  08/31/2011   Procedure: NERVE REPAIR;  Surgeon: Tami Ribas, MD;  Location: [facility];  Service: Orthopedics;  Laterality: Right;  right thumb digital nerve repair  . WISDOM TOOTH EXTRACTION      Family History  Problem Relation Age of Onset  . Stroke Maternal Grandfather   . Heart disease Maternal Grandfather   . Hearing loss Neg Hx     Social History   Tobacco Use  . Smoking status: Former Smoker    Years: 4.00    Types: Cigarettes    Quit date: 05/05/2013    Years since quitting: 6.5  . Smokeless tobacco: Never Used  . Tobacco comment: quit with pos preg  Substance Use Topics  . Alcohol use: No  . Drug use: No     Allergies:  Allergies  Allergen Reactions  . Sulfonamide Derivatives Hives    Medications Prior to Admission  Medication Sig Dispense Refill Last Dose  . Prenatal Vit-Fe Fumarate-FA (PRENATAL VITAMIN) 27-0.8 MG TABS Prenatal Vitamin       Review of Systems  Constitutional: Negative.   Gastrointestinal: Positive for abdominal pain, nausea and vomiting. Negative for constipation and diarrhea.  Genitourinary: Positive for vaginal discharge. Negative for dysuria, flank pain, frequency, hematuria and vaginal bleeding.   Physical Exam   Blood pressure 112/62, pulse 73, temperature 98.7 F (37.1 C), temperature source Oral, resp. rate 15, height 5\' 2"  (1.575 m), weight 75 kg, last menstrual period 09/08/2019, SpO2 100 %, unknown if currently breastfeeding.  Physical Exam Vitals and nursing note reviewed.  Constitutional:      General: She is not in acute distress.    Appearance: She is well-developed and normal weight.  HENT:     Head: Normocephalic and atraumatic.  Pulmonary:     Effort: Pulmonary effort is normal. No respiratory distress.  Abdominal:     Tenderness: There is no abdominal tenderness.  Neurological:     Mental Status: She is alert.  Psychiatric:        Mood and Affect: Mood normal.        Behavior: Behavior normal.     MAU Course  Procedures Results for orders placed or performed during the hospital encounter of 11/05/19 (from the past 24 hour(s))  CBC with Differential     Status: Abnormal   Collection Time: 11/05/19  8:50 PM  Result Value Ref Range   WBC 9.8 4.0 - 10.5 K/uL   RBC 3.82 (L) 3.87 - 5.11 MIL/uL   Hemoglobin 11.6 (L) 12.0 - 15.0 g/dL   HCT 23.7 (L) 36 - 46 %   MCV 93.2 80.0 - 100.0 fL   MCH 30.4 26.0 - 34.0 pg   MCHC 32.6 30.0 - 36.0 g/dL   RDW 62.8 31.5 - 17.6 %   Platelets 168 150 - 400 K/uL   nRBC 0.0 0.0 - 0.2 %   Neutrophils Relative % 61 %   Neutro Abs 6.0 1.7 - 7.7 K/uL   Lymphocytes Relative 31 %   Lymphs Abs 3.0 0.7 -  4.0 K/uL   Monocytes Relative 6 %   Monocytes Absolute 0.6 0.1 - 1.0 K/uL   Eosinophils Relative 2 %   Eosinophils Absolute 0.2 0.0 - 0.5 K/uL   Basophils Relative 0 %   Basophils Absolute 0.0 0.0 - 0.1 K/uL   Immature Granulocytes 0 %   Abs Immature Granulocytes 0.03 0.00 - 0.07 K/uL  Basic metabolic panel     Status: Abnormal   Collection Time: 11/05/19  8:50 PM  Result Value Ref Range   Sodium 135 135 - 145 mmol/L   Potassium 3.4 (L) 3.5 - 5.1 mmol/L   Chloride 106 98 - 111 mmol/L   CO2 22 22 - 32 mmol/L   Glucose, Bld 91 70 - 99 mg/dL   BUN 7 6 - 20 mg/dL   Creatinine, Ser 1.60 0.44 - 1.00 mg/dL   Calcium 8.7 (L) 8.9 - 10.3 mg/dL   GFR, Estimated >73 >71 mL/min   Anion gap 7 5 - 15  Urinalysis, Routine w reflex microscopic Urine, Clean Catch     Status: Abnormal   Collection Time: 11/05/19  8:54 PM  Result Value Ref Range   Color, Urine YELLOW YELLOW   APPearance HAZY (A) CLEAR   Specific Gravity, Urine 1.024 1.005 - 1.030   pH 5.0 5.0 - 8.0   Glucose, UA NEGATIVE NEGATIVE mg/dL   Hgb urine dipstick NEGATIVE NEGATIVE   Bilirubin Urine NEGATIVE NEGATIVE   Ketones, ur NEGATIVE NEGATIVE mg/dL   Protein, ur NEGATIVE NEGATIVE mg/dL   Nitrite POSITIVE (A) NEGATIVE   Leukocytes,Ua NEGATIVE NEGATIVE   RBC / HPF 0-5 0 - 5 RBC/hpf   WBC, UA 6-10 0 - 5 WBC/hpf   Bacteria, UA FEW (A) NONE SEEN   Squamous Epithelial / LPF 0-5 0 - 5   Mucus PRESENT   I-Stat Beta hCG blood, ED ([facility], [facility], [facility] only)     Status: Abnormal   Collection Time: 11/05/19  8:59 PM  Result Value Ref Range   I-stat hCG, quantitative >2,000.0 (H) <5 mIU/mL   Comment 3          hCG, quantitative, pregnancy     Status: Abnormal   Collection Time: 11/05/19 10:49 PM  Result Value Ref Range   hCG, Beta Chain, Quant, S 115,116 (H) <  5 mIU/mL  Wet prep, genital     Status: Abnormal   Collection Time: 11/05/19 10:59 PM   Specimen: Vaginal  Result Value Ref Range   Yeast Wet Prep HPF POC NONE SEEN NONE SEEN    Trich, Wet Prep NONE SEEN NONE SEEN   Clue Cells Wet Prep HPF POC NONE SEEN NONE SEEN   WBC, Wet Prep HPF POC FEW (A) NONE SEEN   Sperm NONE SEEN    US OB Comp Less 14 Wks  Result Date: 11/05/2019 CLINICAL DATA:  Abdominal pain EXAM: OBSTETRIC <14 WK ULTRASOUND TECHNIQUE: Transabdominal ultrasound was performed for evaluation of the gestation as well as the maternal uterus and adnexal regions. COMPARISON:  None. FINDINGS: Intrauterine gestational sac: Single Yolk sac:  Visualized. Embryo:  Visualized. Cardiac Activity: Visualized. Heart Rate: 158 bpm CRL:   17.1 mm   8 w 1 d                  [place] EDC: 06/15/2020 Subchorionic hemorrhage:  A small subchorionic hemorrhage is noted. Maternal uterus/adnexae: The ovaries are normal in appearance. IMPRESSION: Single live intrauterine pregnancy measuring 8 weeks 1 day. Small subchorionic hemorrhage Electronically Signed   By: Jonna Clark M.D.   On: 11/05/2019 23:54    MDM +UPT UA, wet prep, GC/chlamydia, CBC, ABO/Rh, quant hCG, and [place] today to rule out ectopic pregnancy which can be life threatening.   Ultrasound shows live IUP measuring [redacted]w[redacted]d, c/w LMP  Wet prep negative. Will rx terazol based on vaginal discharge & symptoms. GC/CT pending  No vomiting in MAU & no evidence of dehydration based on VS & u/a. Reglan given in MAU prior to discharge & will rx reglan for use at home.   U/a with positive nitrites. Urine culture pending. Pt is afebrile, asymptomatic, & no CVA tenderness. Will tx for UTI with keflex.   Assessment and Plan   1. Abdominal pain during pregnancy in first trimester  -ultrasound shows live IUP. Pain only occurs with vomiting & is likely MSK  2. Yeast vaginitis  -Rx terazol -GC/CT pending  3. Nausea and vomiting during pregnancy prior to [redacted] weeks gestation  -Rx reglan  4. Urinary tract infection in mother during first trimester of pregnancy  -Rx keflex -urine culture pending  5. Normal IUP (intrauterine pregnancy) on  prenatal ultrasound, first trimester  -Call [facility] to start prenatal care  6. [redacted] weeks gestation of pregnancy      Judeth Horn 11/05/2019, 11:01 PM

## 2019-11-05 NOTE — ED Triage Notes (Signed)
Patient requesting STD screening reports malodorous vaginal discharge with nausea this week , denies fever or chills .

## 2019-11-05 NOTE — ED Notes (Signed)
Pt transported to MAU  

## 2019-11-05 NOTE — MAU Note (Signed)
. °  [name] [name] is a 34 y.o. at [redacted]w[redacted]d here in MAU reporting: "chunky white vaginal discharge" that started x4 days ago. She also reports lower right abdominal pain that is when she throws up. No VB. Has thrown up x4 in the past 24 hrs and does not have any meds.  LMP: 09/08/19  Pain score: 5 Vitals:   11/05/19 2200 11/05/19 2227  BP: 134/70 112/62  Pulse: 83 73  Resp:  15  Temp:  98.7 F (37.1 C)  SpO2: 98% 100%     Lab orders placed from triage: UA

## 2019-11-06 DIAGNOSIS — B373 Candidiasis of vulva and vagina: Secondary | ICD-10-CM

## 2019-11-06 LAB — GC/CHLAMYDIA PROBE AMP (~~LOC~~) NOT AT ARMC
Chlamydia: NEGATIVE
Comment: NEGATIVE
Comment: NORMAL
Neisseria Gonorrhea: NEGATIVE

## 2019-11-06 MED ORDER — CEPHALEXIN 500 MG PO CAPS: 500.0000 mg | ORAL_CAPSULE | Freq: Four times a day (QID) | ORAL | 0 refills | Status: AC

## 2019-11-06 MED ORDER — METOCLOPRAMIDE HCL 10 MG PO TABS
10.0000 mg | ORAL_TABLET | Freq: Once | ORAL | Status: AC
Start: 2019-11-06 — End: 2019-11-06
  Administered 2019-11-06: 10 mg via ORAL
  Filled 2019-11-06: qty 1

## 2019-11-06 MED ORDER — METOCLOPRAMIDE HCL 10 MG PO TABS: 10.0000 mg | ORAL_TABLET | Freq: Three times a day (TID) | ORAL | 0 refills | Status: DC | PRN

## 2019-11-06 MED ORDER — TERCONAZOLE 0.4 % VA CREA: 1.0000 | TOPICAL_CREAM | Freq: Every day | VAGINAL | 0 refills | Status: DC

## 2019-11-06 NOTE — Discharge Instructions (Signed)
Morning Sickness  Morning sickness is when a woman feels nauseous during pregnancy. This nauseous feeling may or may not come with vomiting. It often occurs in the morning, but it can be a problem at any time of day. Morning sickness is most common during the first trimester. In some cases, it may continue throughout pregnancy. Although morning sickness is unpleasant, it is usually harmless unless the woman develops severe and continual vomiting (hyperemesis gravidarum), a condition that requires more intense treatment. What are the causes? The exact cause of this condition is not known, but it seems to be related to normal hormonal changes that occur in pregnancy. What increases the risk? You are more likely to develop this condition if:  You experienced nausea or vomiting before your pregnancy.  You had morning sickness during a previous pregnancy.  You are pregnant with more than one baby, such as twins. What are the signs or symptoms? Symptoms of this condition include:  Nausea.  Vomiting. How is this diagnosed? This condition is usually diagnosed based on your signs and symptoms. How is this treated? In many cases, treatment is not needed for this condition. Making some changes to what you eat may help to control symptoms. Your health care provider may also prescribe or recommend:  Vitamin B6 supplements.  Anti-nausea medicines.  Ginger. Follow these instructions at home: Medicines  Take over-the-counter and prescription medicines only as told by your health care provider. Do not use any prescription, over-the-counter, or herbal medicines for morning sickness without first talking with your health care provider.  Taking multivitamins before getting pregnant can prevent or decrease the severity of morning sickness in most women. Eating and drinking  Eat a piece of dry toast or crackers before getting out of bed in the morning.  Eat 5 or 6 small meals a day.  Eat dry and  bland foods, such as rice or a baked potato. Foods that are high in carbohydrates are often helpful.  Avoid greasy, fatty, and spicy foods.  Have someone cook for you if the smell of any food causes nausea and vomiting.  If you feel nauseous after taking prenatal vitamins, take the vitamins at night or with a snack.  Snack on protein foods between meals if you are hungry. Nuts, yogurt, and cheese are good options.  Drink fluids throughout the day.  Try ginger ale made with real ginger, ginger tea made from fresh grated ginger, or ginger candies. General instructions  Do not use any products that contain nicotine or tobacco, such as cigarettes and e-cigarettes. If you need help quitting, ask your health care provider.  Get an air purifier to keep the air in your house free of odors.  Get plenty of fresh air.  Try to avoid odors that trigger your nausea.  Consider trying these methods to help relieve symptoms: ? Wearing an acupressure wristband. These wristbands are often worn for seasickness. ? Acupuncture. Contact a health care provider if:  Your home remedies are not working and you need medicine.  You feel dizzy or light-headed.  You are losing weight. Get help right away if:  You have persistent and uncontrolled nausea and vomiting.  You faint.  You have severe pain in your abdomen. Summary  Morning sickness is when a woman feels nauseous during pregnancy. This nauseous feeling may or may not come with vomiting.  Morning sickness is most common during the first trimester.  It often occurs in the morning, but it can be a problem at   any time of day.  In many cases, treatment is not needed for this condition. Making some changes to what you eat may help to control symptoms. This information is not intended to replace advice given to you by your health care provider. Make sure you discuss any questions you have with your health care provider. Document Revised:  12/11/2016 Document Reviewed: 02/01/2016 Elsevier Patient Education  2020 Elsevier Inc.     Pregnancy and Urinary Tract Infection  A urinary tract infection (UTI) is an infection of any part of the urinary tract. This includes the kidneys, the tubes that connect your kidneys to your bladder (ureters), the bladder, and the tube that carries urine out of your body (urethra). These organs make, store, and get rid of urine in the body. Your health care provider may use other names to describe the infection. An upper UTI affects the ureters and kidneys (pyelonephritis). A lower UTI affects the bladder (cystitis) and urethra (urethritis). Most urinary tract infections are caused by bacteria in your genital area, around the entrance to your urinary tract (urethra). These bacteria grow and cause irritation and inflammation of your urinary tract. You are more likely to develop a UTI during pregnancy because the physical and hormonal changes your body goes through can make it easier for bacteria to get into your urinary tract. Your growing baby also puts pressure on your bladder and can affect urine flow. It is important to recognize and treat UTIs in pregnancy because of the risk of serious complications for both you and your baby. How does this affect me? Symptoms of a UTI include:  Needing to urinate right away (urgently).  Frequent urination or passing small amounts of urine frequently.  Pain or burning with urination.  Blood in the urine.  Urine that smells bad or unusual.  Trouble urinating.  Cloudy urine.  Pain in the abdomen or lower back.  Vaginal discharge. You may also have:  Vomiting or a decreased appetite.  Confusion.  Irritability or tiredness.  A fever.  Diarrhea. How does this affect my baby? An untreated UTI during pregnancy could lead to a kidney infection or a systemic infection, which can cause health problems that could affect your baby. Possible complications  of an untreated UTI include:  Giving birth to your baby before 37 weeks of pregnancy (premature).  Having a baby with a low birth weight.  Developing high blood pressure during pregnancy (preeclampsia).  Having a low hemoglobin level (anemia). What can I do to lower my risk? To prevent a UTI:  Go to the bathroom as soon as you feel the need. Do not hold urine for long periods of time.  Always wipe from front to back, especially after a bowel movement. Use each tissue one time when you wipe.  Empty your bladder after sex.  Keep your genital area dry.  Drink 6-10 glasses of water each day.  Do not douche or use deodorant sprays. How is this treated? Treatment for this condition may include:  Antibiotic medicines that are safe to take during pregnancy.  Other medicines to treat less common causes of UTI. Follow these instructions at home:  If you were prescribed an antibiotic medicine, take it as told by your health care provider. Do not stop using the antibiotic even if you start to feel better.  Keep all follow-up visits as told by your health care provider. This is important. Contact a health care provider if:  Your symptoms do not improve or they get  worse.  You have abnormal vaginal discharge. Get help right away if you:  Have a fever.  Have nausea and vomiting.  Have back or side pain.  Feel contractions in your uterus.  Have lower belly pain.  Have a gush of fluid from your vagina.  Have blood in your urine. Summary  A urinary tract infection (UTI) is an infection of any part of the urinary tract, which includes the kidneys, ureters, bladder, and urethra.  Most urinary tract infections are caused by bacteria in your genital area, around the entrance to your urinary tract (urethra).  You are more likely to develop a UTI during pregnancy.  If you were prescribed an antibiotic medicine, take it as told by your health care provider. Do not stop using the  antibiotic even if you start to feel better. This information is not intended to replace advice given to you by your health care provider. Make sure you discuss any questions you have with your health care provider. Document Revised: 04/22/2018 Document Reviewed: 12/02/2017 Elsevier Patient Education  2020 [employer].

## 2019-11-07 LAB — CULTURE, OB URINE: Culture: >=100000 — AB

## 2019-12-11 ENCOUNTER — Telehealth (INDEPENDENT_AMBULATORY_CARE_PROVIDER_SITE_OTHER): Payer: Self-pay | Admitting: *Deleted

## 2019-12-11 DIAGNOSIS — Z3490 Encounter for supervision of normal pregnancy, unspecified, unspecified trimester: Secondary | ICD-10-CM

## 2019-12-11 DIAGNOSIS — R8271 Bacteriuria: Secondary | ICD-10-CM

## 2019-12-11 DIAGNOSIS — B009 Herpesviral infection, unspecified: Secondary | ICD-10-CM

## 2019-12-11 NOTE — Progress Notes (Signed)
8:35 patient not connected virtually; does not have Mychart. I called Shaka and left a message I am calling for her virtual appt and can send a link; I will call again in a few moments- please answer when I call back. Heberto Sturdevant,RN 8:43 I called Hermine and left another message I am calling about your virtual appointment and since I have been unable to connect with you; you will need to call our office to reschedule your appointment. Diego Ulbricht,RN

## 2019-12-14 ENCOUNTER — Other Ambulatory Visit: Payer: Self-pay

## 2019-12-14 ENCOUNTER — Telehealth (INDEPENDENT_AMBULATORY_CARE_PROVIDER_SITE_OTHER): Payer: Self-pay | Admitting: *Deleted

## 2019-12-14 DIAGNOSIS — R8271 Bacteriuria: Secondary | ICD-10-CM

## 2019-12-14 DIAGNOSIS — Z3490 Encounter for supervision of normal pregnancy, unspecified, unspecified trimester: Secondary | ICD-10-CM

## 2019-12-14 DIAGNOSIS — F419 Anxiety disorder, unspecified: Secondary | ICD-10-CM

## 2019-12-14 DIAGNOSIS — B009 Herpesviral infection, unspecified: Secondary | ICD-10-CM

## 2019-12-14 NOTE — Patient Instructions (Signed)
  At [facility], we work as an integrated team, providing care to address both physical and emotional health. Your medical provider may refer you to see our Behavioral Health Clinician (BHC) on the same day you see your medical provider, as availability permits.  Our BHC is available to all patients, visits generally last between 20-30 minutes, but can be longer or shorter, depending on patient need. The BHC offers help with stress management, coping with symptoms of depression and anxiety, major life changes , sleep issues, changing risky behavior, grief and loss, life stress, working on personal life goals, and  behavioral health issues, as these all affect your overall health and wellness.  The BHC is NOT available for the following: FMLA paperwork, court-ordered evaluations, specialty assessments (custody or disability), letters to employers, or obtaining certification for an emotional support animal. The BHC does not provide long-term therapy. You have the right to refuse integrated behavioral health services, or to reschedule to see the BHC at a later date.  Exception: If you are having thoughts of suicide, we require that you either see the BHC for further assessment, or contract for safety with your medical provider. Confidentiality exception: If it is suspected that a child or disabled adult is being abused or neglected, we are required by law to report that to either Child Protective Services or Adult Protective Services.  If you have a diagnosis of Bipolar affective disorder, Schizophrenia, or recurrent Major depressive disorder, we will recommend that you establish care with a psychiatrist, as these are lifelong, chronic conditions, and we want your overall emotional health and medications to be more closely monitored. If you anticipate needing extended maternity leave due to mental illness, it it recommended you inform your medical provider, so  we can put in a referral to a  psychiatrist as soon as possible. The BHC is unable to recommend an extended maternity leave for mental health issues. Your medical provider or BHC may refer you to a therapist for ongoing, traditional therapy, or to a psychiatrist, for medication management, if it would benefit your overall health. Depending on your insurance, you may have a copay to see the BHC. If you are uninsured, it is recommended that you apply for financial assistance. (Forms may be requested at the front desk for in-person visits, via MyChart, or request a form during a virtual visit).  If you see the BHC more than 6 times, you will have to complete a comprehensive clinical assessment interview with the BHC to resume integrated services.  For virtual visits with the BHC, you must be physically in the state of [place] at the time of the visit. For example, if you live in [place], you will have to do an in-person visit with the BHC. If you are going out of the state or country for any reason, the BHC may see you virtually when you return to [place], but not while you are physically outside of [place].    

## 2019-12-14 NOTE — Progress Notes (Addendum)
10:23 [name] [name] not connected virtually for her visit today. I called her and left a message I was calling for her appointment today that is scheduled to be virtual and will call back in a few minutes; please answer. [name] Brunelle,RN 10:28 I called [name] [name] her home/mobile number  again for her appointment today and left a message I am calling for her appointment scheduled  Today and since I could not reach you; you will need to call our office to reschedule. I also called her contact number and asked for her. A female answered and said she was there. She came to the line and I asked if she wishes to proceed which she did. I sent a text link to begin virtual visit.   New OB Intake  I connected with  [name] [name] on 12/14/19 at 10:35  by MyChart and verified that I am speaking with the correct person using two identifiers. Nurse is located at [name] and pt is located at home.  I discussed the limitations, risks, security and privacy concerns of performing an evaluation and management service by telephone and the availability of in person appointments. I also discussed with the patient that there may be a patient responsible charge related to this service. The patient expressed understanding and agreed to proceed.  I explained I am completing New OB Intake today. We discussed her EDD of 06/14/2020 that is based on LMP of 09/08/19. Pt is G2/P1001. I reviewed her allergies, medications, Medical/Surgical/OB history, and appropriate screenings. I informed her of [facility] services.  Her scores are not elevated but would like to talk with [name] [name] about some issues. I explained I will make referrral and registrar will contact her with an appointment. Based on history, this is a/an uncomplicated pregnancy.  Concerns addressed today  MyChart/Babyscripts Sent patient MyChart text and asked to create account and download today after todays visit.  I explained pt will have some visits in office and some virtually. Babyscripts  instructions given  And email sent.   Blood Pressure Cuff Patient does not have a bp cuff. Patient has applied for Medicaid but not yet received. I explained we can order a RX for bp cuff once she has medicaid.  Explained after she received bp cuff pt will check weekly and document in Babyscripts.  Anatomy US Explained first scheduled [place] will be around 19 weeks. Anatomy US scheduled for 01/19/2020 at 0945. Pt notified to arrive at 0930.  Labs Discussed [name] genetic screening with patient. Would like both Panorama and Horizon drawn at new OB visit. Routine prenatal labs needed.  COVID Vaccine Patient reports she had vaccine at [name]  but lost her vaccination card. I asked her to contact [facility] for proof of vaccination and bring with her to first ob appointment so we can update her record.   WIC Patient would like [name] referral. Referral sent.   First visit review I reviewed new OB appt with pt. I explained she will have a pelvic exam, ob bloodwork with genetic screening, and PAP smear. Explained pt will be seen by [name] [name]  at first visit; encounter routed to appropriate provider.  [name] Burford,RN 12/14/2019  10:35 AM    Chart reviewed for nurse visit. Agree with plan of care.   [name] Paris, NP 12/14/2019 12:14 PM

## 2019-12-19 ENCOUNTER — Encounter: Payer: Self-pay | Admitting: Nurse Practitioner

## 2019-12-27 ENCOUNTER — Other Ambulatory Visit: Payer: Self-pay

## 2019-12-27 ENCOUNTER — Emergency Department (HOSPITAL_COMMUNITY)
Admission: EM | Admit: 2019-12-27 | Discharge: 2019-12-27 | Disposition: A | Discharge status: Home / Self Care | Payer: Medicaid Other | Attending: Emergency Medicine | Admitting: Emergency Medicine

## 2019-12-27 ENCOUNTER — Encounter (HOSPITAL_COMMUNITY): Payer: Self-pay | Admitting: Obstetrics and Gynecology

## 2019-12-27 DIAGNOSIS — H1131 Conjunctival hemorrhage, right eye: Secondary | ICD-10-CM

## 2019-12-27 DIAGNOSIS — S1980XA Other specified injuries of unspecified part of neck, initial encounter: Secondary | ICD-10-CM

## 2019-12-27 DIAGNOSIS — S1091XA Abrasion of unspecified part of neck, initial encounter: Secondary | ICD-10-CM | POA: Insufficient documentation

## 2019-12-27 DIAGNOSIS — O9A211 Injury, poisoning and certain other consequences of external causes complicating pregnancy, first trimester: Secondary | ICD-10-CM | POA: Insufficient documentation

## 2019-12-27 DIAGNOSIS — Z3A14 14 weeks gestation of pregnancy: Secondary | ICD-10-CM | POA: Diagnosis not present

## 2019-12-27 DIAGNOSIS — Z87891 Personal history of nicotine dependence: Secondary | ICD-10-CM | POA: Diagnosis not present

## 2019-12-27 LAB — CBC
HCT: 32.1 % — ABNORMAL LOW (ref 36.0–46.0)
Hemoglobin: 11 g/dL — ABNORMAL LOW (ref 12.0–15.0)
MCH: 30.9 pg (ref 26.0–34.0)
MCHC: 34.3 g/dL (ref 30.0–36.0)
MCV: 90.2 fL (ref 80.0–100.0)
Platelets: 152 10*3/uL (ref 150–400)
RBC: 3.56 MIL/uL — ABNORMAL LOW (ref 3.87–5.11)
RDW: 14 % (ref 11.5–15.5)
WBC: 9.6 10*3/uL (ref 4.0–10.5)
nRBC: 0 % (ref 0.0–0.2)

## 2019-12-27 LAB — COMPREHENSIVE METABOLIC PANEL
ALT: 16 U/L (ref 0–44)
AST: 19 U/L (ref 15–41)
Albumin: 3.4 g/dL — ABNORMAL LOW (ref 3.5–5.0)
Alkaline Phosphatase: 40 U/L (ref 38–126)
Anion gap: 9 (ref 5–15)
BUN: 9 mg/dL (ref 6–20)
CO2: 21 mmol/L — ABNORMAL LOW (ref 22–32)
Calcium: 8.9 mg/dL (ref 8.9–10.3)
Chloride: 105 mmol/L (ref 98–111)
Creatinine, Ser: 0.67 mg/dL (ref 0.44–1.00)
GFR, Estimated: >60 mL/min (ref >60)
Glucose, Bld: 98 mg/dL (ref 70–99)
Potassium: 3.2 mmol/L — ABNORMAL LOW (ref 3.5–5.1)
Sodium: 135 mmol/L (ref 135–145)
Total Bilirubin: 0.3 mg/dL (ref 0.3–1.2)
Total Protein: 7.1 g/dL (ref 6.5–8.1)

## 2019-12-27 LAB — HCG, QUANTITATIVE, PREGNANCY: hCG, Beta Chain, Quant, S: 45572 m[IU]/mL — ABNORMAL HIGH (ref <5)

## 2019-12-27 LAB — ABO/RH: ABO/RH(D): A POS
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
## 2019-12-27 NOTE — Discharge Instructions (Addendum)
Return to the emergency department if you develop difficulty breathing, severe headache, weakness or numbness of the extremities, visual disturbances, or other new and concerning symptoms.
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
## 2020-01-01 ENCOUNTER — Ambulatory Visit (INDEPENDENT_AMBULATORY_CARE_PROVIDER_SITE_OTHER): Payer: Self-pay | Admitting: Obstetrics & Gynecology

## 2020-01-01 ENCOUNTER — Encounter: Payer: Self-pay | Admitting: *Deleted

## 2020-01-01 ENCOUNTER — Other Ambulatory Visit (HOSPITAL_COMMUNITY)
Admission: RE | Admit: 2020-01-01 | Discharge: 2020-01-01 | Disposition: A | Discharge status: Home / Self Care | Payer: Medicaid Other | Source: Ambulatory Visit | Attending: Obstetrics & Gynecology | Admitting: Obstetrics & Gynecology

## 2020-01-01 ENCOUNTER — Other Ambulatory Visit: Payer: Self-pay

## 2020-01-01 ENCOUNTER — Encounter: Payer: Self-pay | Admitting: Obstetrics & Gynecology

## 2020-01-01 VITALS — BP 107/65 | HR 79 | Wt 142.2 lb

## 2020-01-01 DIAGNOSIS — Z87891 Personal history of nicotine dependence: Secondary | ICD-10-CM

## 2020-01-01 DIAGNOSIS — Z3A16 16 weeks gestation of pregnancy: Secondary | ICD-10-CM | POA: Diagnosis present

## 2020-01-01 DIAGNOSIS — Z3482 Encounter for supervision of other normal pregnancy, second trimester: Secondary | ICD-10-CM | POA: Diagnosis present

## 2020-01-01 DIAGNOSIS — O0930 Supervision of pregnancy with insufficient antenatal care, unspecified trimester: Secondary | ICD-10-CM

## 2020-01-01 DIAGNOSIS — R8271 Bacteriuria: Secondary | ICD-10-CM

## 2020-01-01 DIAGNOSIS — Z315 Encounter for genetic counseling: Secondary | ICD-10-CM | POA: Insufficient documentation

## 2020-01-01 DIAGNOSIS — Z124 Encounter for screening for malignant neoplasm of cervix: Secondary | ICD-10-CM | POA: Insufficient documentation

## 2020-01-01 DIAGNOSIS — Z3490 Encounter for supervision of normal pregnancy, unspecified, unspecified trimester: Secondary | ICD-10-CM

## 2020-01-01 DIAGNOSIS — B009 Herpesviral infection, unspecified: Secondary | ICD-10-CM

## 2020-01-01 DIAGNOSIS — O219 Vomiting of pregnancy, unspecified: Secondary | ICD-10-CM

## 2020-01-01 DIAGNOSIS — T7491XA Unspecified adult maltreatment, confirmed, initial encounter: Secondary | ICD-10-CM

## 2020-01-01 LAB — POCT URINALYSIS DIP (DEVICE)
Bilirubin Urine: NEGATIVE
Glucose, UA: NEGATIVE mg/dL
Hgb urine dipstick: NEGATIVE
Ketones, ur: NEGATIVE mg/dL
Leukocytes,Ua: NEGATIVE
Nitrite: NEGATIVE
Protein, ur: NEGATIVE mg/dL
Specific Gravity, Urine: 1.025 (ref 1.005–1.030)
Urobilinogen, UA: 0.2 mg/dL (ref 0.0–1.0)
pH: 7.5 (ref 5.0–8.0)

## 2020-01-01 MED ORDER — ONDANSETRON HCL 4 MG PO TABS: 4.0000 mg | ORAL_TABLET | Freq: Three times a day (TID) | ORAL | 0 refills | Status: DC | PRN
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
## 2020-01-02 LAB — CBC/D/PLT+RPR+RH+ABO+RUB AB...
Antibody Screen: NEGATIVE
Basophils Absolute: 0 10*3/uL (ref 0.0–0.2)
Basos: 0 %
EOS (ABSOLUTE): 0.2 10*3/uL (ref 0.0–0.4)
Eos: 2 %
HCV Ab: <0.1 s/co ratio (ref 0.0–0.9)
HIV Screen 4th Generation wRfx: NONREACTIVE
Hematocrit: 35.8 % (ref 34.0–46.6)
Hemoglobin: 12 g/dL (ref 11.1–15.9)
Hepatitis B Surface Ag: NEGATIVE
Immature Grans (Abs): 0.1 10*3/uL (ref 0.0–0.1)
Immature Granulocytes: 1 %
Lymphocytes Absolute: 2.5 10*3/uL (ref 0.7–3.1)
Lymphs: 21 %
MCH: 30.4 pg (ref 26.6–33.0)
MCHC: 33.5 g/dL (ref 31.5–35.7)
MCV: 91 fL (ref 79–97)
Monocytes Absolute: 0.6 10*3/uL (ref 0.1–0.9)
Monocytes: 5 %
Neutrophils Absolute: 8.6 10*3/uL — ABNORMAL HIGH (ref 1.4–7.0)
Neutrophils: 71 %
Platelets: 171 10*3/uL (ref 150–450)
RBC: 3.95 x10E6/uL (ref 3.77–5.28)
RDW: 13.3 % (ref 11.7–15.4)
RPR Ser Ql: NONREACTIVE
Rh Factor: POSITIVE
Rubella Antibodies, IGG: 2.7 index (ref >0.99)
WBC: 11.9 10*3/uL — ABNORMAL HIGH (ref 3.4–10.8)

## 2020-01-02 LAB — HCV INTERPRETATION

## 2020-01-02 LAB — CERVICOVAGINAL ANCILLARY ONLY
Bacterial Vaginitis (gardnerella): POSITIVE — AB
Candida Glabrata: NEGATIVE
Candida Vaginitis: POSITIVE — AB
Chlamydia: NEGATIVE
Comment: NEGATIVE
Comment: NEGATIVE
Comment: NEGATIVE
Comment: NEGATIVE
Comment: NEGATIVE
Comment: NORMAL
Neisseria Gonorrhea: NEGATIVE
Trichomonas: NEGATIVE

## 2020-01-03 ENCOUNTER — Telehealth: Payer: Self-pay | Admitting: *Deleted

## 2020-01-03 DIAGNOSIS — B9689 Other specified bacterial agents as the cause of diseases classified elsewhere: Secondary | ICD-10-CM

## 2020-01-03 DIAGNOSIS — B3731 Acute candidiasis of vulva and vagina: Secondary | ICD-10-CM

## 2020-01-03 LAB — CYTOLOGY - PAP
Comment: NEGATIVE
Diagnosis: NEGATIVE
Diagnosis: REACTIVE
High risk HPV: NEGATIVE

## 2020-01-03 MED ORDER — METRONIDAZOLE 500 MG PO TABS: 500.0000 mg | ORAL_TABLET | Freq: Two times a day (BID) | ORAL | 0 refills | Status: DC

## 2020-01-03 MED ORDER — TERCONAZOLE 0.4 % VA CREA: 1.0000 | TOPICAL_CREAM | Freq: Every day | VAGINAL | 0 refills | Status: AC

## 2020-01-03 NOTE — Telephone Encounter (Signed)
I called [name] [name] and notified her of BV and yeast and that I will send in RX. I verified pharmacy and explained instructions . She voices understanding. [name],RN

## 2020-01-03 NOTE — Telephone Encounter (Signed)
-----   Message from Jerene Bears, MD sent at 01/03/2020  8:22 AM EST ----- Please let pt know her vaginal swab showed BV and yeast.  Please advised treatment with terazol 7, one application nightly x 7 nights and flagyl 500mg  bid x 7 days.  Thanks.

## 2020-01-05 LAB — URINE CULTURE, OB REFLEX

## 2020-01-05 LAB — CULTURE, OB URINE

## 2020-01-05 LAB — OB RESULTS CONSOLE GBS: GBS: POSITIVE

## 2020-01-08 ENCOUNTER — Telehealth: Payer: Self-pay | Admitting: *Deleted

## 2020-01-08 DIAGNOSIS — R8271 Bacteriuria: Secondary | ICD-10-CM

## 2020-01-08 MED ORDER — AMOXICILLIN 500 MG PO CAPS: 500.0000 mg | ORAL_CAPSULE | Freq: Three times a day (TID) | ORAL | 0 refills | Status: DC

## 2020-01-08 NOTE — Telephone Encounter (Addendum)
-----   Message from Jerene Bears, MD sent at 01/08/2020  1:24 PM EST ----- Please let pt know her urine culture is positive for GBS.  Needs treatment with amoxicillin 500mg  TID x 7 days.  Thanks.  12/27 1635  Called pt and informed her of GBS UTI. She will need antibiotic Rx now and will also receive antibiotics during labor in order to protect the baby. Pt voiced understanding and had no questions. Rx sent to pt's pharmacy per Dr. 1/28 order.

## 2020-01-09 ENCOUNTER — Other Ambulatory Visit: Payer: Self-pay

## 2020-01-11 ENCOUNTER — Ambulatory Visit (INDEPENDENT_AMBULATORY_CARE_PROVIDER_SITE_OTHER): Payer: Self-pay | Admitting: Clinical

## 2020-01-11 DIAGNOSIS — F4323 Adjustment disorder with mixed anxiety and depressed mood: Secondary | ICD-10-CM

## 2020-01-11 DIAGNOSIS — F419 Anxiety disorder, unspecified: Secondary | ICD-10-CM

## 2020-01-11 DIAGNOSIS — Z3490 Encounter for supervision of normal pregnancy, unspecified, unspecified trimester: Secondary | ICD-10-CM

## 2020-01-11 DIAGNOSIS — Z658 Other specified problems related to psychosocial circumstances: Secondary | ICD-10-CM

## 2020-01-11 NOTE — Patient Instructions (Signed)
[facility] for [facility] [place], [place] [address] [phone] (main office) [phone] [facility] office)  [email or web address] (view virtual tour of hospital, sign up for classes, etc.)  Coping with Panic Attacks   What is a panic attack?  You may have had a panic attack if you experienced four or more of the symptoms listed below coming on abruptly and peaking in about 10 minutes.  Panic Symptoms   . Pounding heart  . Sweating  . Trembling or shaking  . Shortness of breath  . Feeling of choking  . Chest pain  . Nausea or abdominal distress    . Feeling dizzy, unsteady, lightheaded, or faint  . Feelings of unreality or being detached from yourself  . Fear of losing control or going crazy  . Fear of dying  . Numbness or tingling  . Chills or hot flashes      Panic attacks are sometimes accompanied by avoidance of certain places or situations. These are often situations that would be difficult to escape from or in which help might not be available. Examples might include crowded shopping malls, public transportation, restaurants, or driving.   Why do panic attacks occur?   Panic attacks are the body's alarm system gone awry. All of [place] have a built-in alarm system, powered by adrenaline, which increases our heart rate, breathing, and blood flow in response to danger. Ordinarily, this 'danger response system' works well. In some people, however, the response is either out of proportion to whatever stress is going on, or may come out of the blue without any stress at all.   For example, if you are walking in the woods and see a bear coming your way, a variety of changes occur in your body to prepare you to either fight the danger or flee from the situation. Your heart rate will increase to get more blood flow around your body, your breathing rate will quicken so that more oxygen is available, and your muscles will tighten in  order to be ready to fight or run. You may feel nauseated as blood flow leaves your stomach area and moves into your limbs. These bodily changes are all essential to helping you survive the dangerous situation. After the danger has passed, your body functions will begin to go back to normal. This is because your body also has a system for "recovering" by bringing your body back down to a normal state when the danger is over.   As you can see, the emergency response system is adaptive when there is, in fact, a "true" or "real" danger (e.g., bear). However, sometimes people find that their emergency response system is triggered in "everyday" situations where there really is no true physical danger (e.g., in a meeting, in the grocery store, while driving in normal traffic, etc.).   What triggers a panic attack?  Sometimes particularly stressful situations can trigger a panic attack. For example, an argument with your spouse or stressors at work can cause a stress response (activating the emergency response system) because you perceive it as threatening or overwhelming, even if there is no direct risk to your survival.  Sometimes panic attacks don't seem to be triggered by anything in particular- they may "come out of the blue". Somehow, the natural "fight or flight" emergency response system has gotten activated when there is no real danger. Why does the body go into "emergency mode" when there is no real danger?   Often, people  with panic attacks are frightened or alarmed by the physical sensations of the emergency response system. First, unexpected physical sensations are experienced (tightness in your chest or some shortness of breath). This then leads to feeling fearful or alarmed by these symptoms ("Something's wrong!", "Am I having a heart attack?", "Am I going to faint?") The mind perceives that there is a danger even though no real danger exists. This, in turn, activates the emergency response system  ("fight or flight"), leading to a "full blown" panic attack. In summary, panic attacks occur when we misinterpret physical symptoms as signs of impending death, craziness, loss of control, embarrassment, or fear of fear. Sometimes you may be aware of thoughts of danger that activate the emergency response system (for example, thinking "I'm having a heart attack" when you feel chest pressure or increased heart rate). At other times, however, you may not be aware of such thoughts. After several incidences of being afraid of physical sensations, anxiety and panic can occur in response to the initial sensations without conscious thoughts of danger. Instead, you just feel afraid or alarmed. In other words, the panic or fear may seem to occur "automatically" without you consciously telling yourself anything.   After having had one or more panic attacks, you may also become more focused on what is going on inside your body. You may scan your body and be more vigilant about noticing any symptoms that might signal the start of a panic attack. This makes it easier for panic attacks to happen again because you pick up on sensations you might otherwise not have noticed, and misinterpret them as something dangerous. A panic attack may then result.      How do I cope with panic attacks?  An important part of overcoming panic attacks involves re-interpreting your body's physical reactions and teaching yourself ways to decrease the physical arousal. This can be done through practicing the cognitive and behavioral interventions below.   Research has found that over half of people who have panic attacks show some signs of hyperventilation or overbreathing. This can produce initial sensations that alarm you and lead to a panic attack. Overbreathing can also develop as part of the panic attack and make the symptoms worse. When people hyperventilate, certain blood vessels in the body become narrower. In particular, the brain  may get slightly less oxygen. This can lead to the symptoms of dizziness, confusion, and lightheadedness that often occur during panic attacks. Other parts of the body may also get a bit less oxygen, which may lead to numbness or tingling in the hands or feet or the sensation of cold, clammy hands. It also may lead the heart to pump harder. Although these symptoms may be frightening and feel unpleasant, it is important to remember that hyperventilating is not dangerous. However, you can help overcome the unpleasantness of overbreathing by practicing Breathing Retraining.   Practice this basic technique three times a day, every day:  . Inhale. With your shoulders relaxed, inhale as slowly and deeply as you can while you count to six. If you can, use your diaphragm to fill your lungs with air.  . Hold. Keep the air in your lungs as you slowly count to four.  . Exhale. Slowly breath out as you count to six.  . Repeat. Do the inhale-hold-exhale cycle several times. Each time you do it, exhale for longer counts.  Like any new skill, Breathing Retraining requires practice. Try practicing this skill twice a day for several minutes.  Initially, do not try this technique in specific situations or when you become frightened or have a panic attack. Begin by practicing in a quiet environment to build up your skill level so that you can later use it in time of "emergency."   2. Decreasing Avoidance  Regardless of whether you can identify why you began having panic attacks or whether they seemed to come out of the blue, the places where you began having panic attacks often can become triggers themselves. It is not uncommon for individuals to begin to avoid the places where they have had panic attacks. Over time, the individual may begin to avoid more and more places, thereby decreasing their activities and often negatively impacting their quality of life. To break the cycle of avoidance, it is important to first  identify the places or situations that are being avoided, and then to do some "relearning."  To begin this intervention, first create a list of locations or situations that you tend to avoid. Then choose an avoided location or situation that you would like to target first. Now develop an "exposure hierarchy" for this situation or location. An "exposure hierarchy" is a list of actions that make you feel anxious in this situation. Order these actions from least to most anxiety-producing. It is often helpful to have the first item on your hierarchy involve thinking or imagining part of the feared/avoided situation.   Here is an example of an exposure hierarchy for decreasing avoidance of the grocery store. Note how it is ordered from the least amount of anxiety (at the top) to the most anxiety (at the bottom):  Marland Kitchen Think about going to the grocery store alone.  . Go to the grocery store with a friend or family member.  . Go to the grocery store alone to pick up a few small items (5-10 minutes in the store).  . Shopping for 10-20 minutes in the store alone.  . Doing the shopping for the week by myself (20-30 minutes in the store).   Your homework is to "expose" yourself to the lowest item on your hierarchy and use your breathing relaxation and coping statements (see below) to help you remain in the situation. Practice this several times during the upcoming week. Once you have mastered each item with minimal anxiety, move on to the next higher action on your list.   Cognitive Interventions  1. Identify your negative self-talk Anxious thoughts can increase anxiety symptoms and panic. The first step in changing anxious thinking is to identify your own negative, alarming self-talk. Some common alarming thoughts:  . I'm having a heart attack.            . I must be going crazy. . I think I'm dying. Marland Kitchen People will think I'm crazy. . I'm going to pass our.  . Oh no- here it comes.  . I can't stand this.   Rene Paci got to get out of here!  2. Use positive coping statements Changing or disrupting a pattern of anxious thoughts by replacing them with more calming or supportive statements can help to divert a panic attack. Some common helpful coping statements:  . This is not an emergency.  . I don't like feeling this way, but I can accept it.  . I can feel like this and still be okay.  . This has happened before, and I was okay. I'll be okay this time, too.  . I can be anxious and still deal with this situation.    /  Emotional Wellbeing Apps and Websites Here are a few free apps meant to help you to help yourself.  To find, try searching on the internet to see if the app is offered on Apple/Android devices. If your first choice doesn't come up on your device, the good news is that there are many choices! Play around with different apps to see which ones are helpful to you.    Calm This is an app meant to help increase calm feelings. Includes info, strategies, and tools for tracking your feelings.      Calm Harm  This app is meant to help with self-harm. Provides many 5-minute or 15-min coping strategies for doing instead of hurting yourself.       Satsuma is a problem-solving tool to help deal with emotions and cope with stress you encounter wherever you are.      MindShift This app can help people cope with anxiety. Rather than trying to avoid anxiety, you can make an important shift and face it.      MY3  MY3 features a support system, safety plan and resources with the goal of offering a tool to use in a time of need.       My Life My Voice  This mood journal offers a simple solution for tracking your thoughts, feelings and moods. Animated emoticons can help identify your mood.       Relax Melodies Designed to help with sleep, on this app you can mix sounds and meditations for relaxation.      Smiling Mind Smiling Mind is meditation made easy: it's  a simple tool that helps put a smile on your mind.        Stop, Breathe & Think  A friendly, simple guide for people through meditations for mindfulness and compassion.  Stop, Breathe and Think Kids Enter your current feelings and choose a "mission" to help you cope. Offers videos for certain moods instead of just sound recordings.       Team Orange The goal of this tool is to help teens change how they think, act, and react. This app helps you focus on your own good feelings and experiences.      The [place] Box The [place] Box (VHB) contains simple tools to help patients with coping, relaxation, distraction, and positive thinking.       BRAINSTORMING  Develop a Plan Goals: . Provide a way to start conversation about your new life with a baby . Assist parents in recognizing and using resources within their reach . Help pave the way before birth for an easier period of transition afterwards.  Make a list of the following information to keep in a central location: . Full name of Mom and Partner: _____________________________________________ . 58 full name and Date of Birth: ___________________________________________ . Home Address: ___________________________________________________________ ________________________________________________________________________ . Home Phone: ____________________________________________________________ . Parents' cell numbers: _____________________________________________________ ________________________________________________________________________ . Name and contact info for OB: ______________________________________________ . Name and contact info for Pediatrician:________________________________________ . Contact info for Lactation Consultants: ________________________________________  REST and SLEEP *You each need at least 4-5 hours of uninterrupted sleep every day. Write specific names and contact  information.* . How are you going to rest in the postpartum period? While partner's home? When partner returns to work? When you both return to work? Marland Kitchen Where will your baby sleep? Marland Kitchen Who is available to help during the day? Evening? Night? . Who could move in for a period to help support you? Marland Kitchen  What are some ideas to help you get enough sleep? __________________________________________________________________________________________________________________________________________________________________________________________________________________________________________ NUTRITIOUS FOOD AND DRINK *Plan for meals before your baby is born so you can have healthy food to eat during the immediate postpartum period.* . Who will look after breakfast? Lunch? Dinner? List names and contact information. Brainstorm quick, healthy ideas for each meal. . What can you do before baby is born to prepare meals for the postpartum period? . How can others help you with meals? Marland Kitchen Which grocery stores provide online shopping and delivery? Marland Kitchen Which restaurants offer take-out or delivery options? ______________________________________________________________________________________________________________________________________________________________________________________________________________________________________________________________________________________________________________________________________________________________________________________________________  CARE FOR MOM *It's important that mom is cared for and pampered in the postpartum period. Remember, the most important ways new mothers need care are: sleep, nutrition, gentle exercise, and time off.* . Who can come take care of mom during this period? Make a list of people with their contact information. . List some activities that make you feel cared for, rested, and energized? Who can make sure you have opportunities to do these  things? . Does mom have a space of her very own within your home that's just for her? Make a "[facility]" where she can be comfortable, rest, and renew herself daily. ______________________________________________________________________________________________________________________________________________________________________________________________________________________________________________________________________________________________________________________________________________________________________________________________________    CARE FOR AND FEEDING BABY *Knowledgeable and encouraging people will offer the best support with regard to feeding your baby.* . Educate yourself and choose the best feeding option for your baby. . Make a list of people who will guide, support, and be a resource for you as your care for and feed your baby. (Friends that have breastfed or are currently breastfeeding, lactation consultants, breastfeeding support groups, etc.) . Consider a postpartum doula. (These websites can give you information: dona.org & BuyingShow.es) . Seek out local breastfeeding resources like the breastfeeding support group at [employer] or [employer]. ______________________________________________________________________________________________________________________________________________________________________________________________________________________________________________________________________________________________________________________________________________________________________________________________________  Verner Chol AND ERRANDS . Who can help with a thorough cleaning before baby is born? . Make a list of people who will help with housekeeping and chores, like laundry, light cleaning, dishes, bathrooms, etc. . Who can run some errands for you? Marland Kitchen What can you do to make sure you are stocked with basic supplies before baby is born? . Who is going to  do the shopping? ______________________________________________________________________________________________________________________________________________________________________________________________________________________________________________________________________________________________________________________________________________________________________________________________________     Family Adjustment *Nurture yourselves.it helps parents be more loving and allows for better bonding with their child.* . What sorts of things do you and partner enjoy doing together? Which activities help you to connect and strengthen your relationship? Make a list of those things. Make a list of people whom you trust to care for your baby so you can have some time together as a couple. . What types of things help partner feel connected to Mom? Make a list. . What needs will partner have in order to bond with baby? . Other children? Who will care for them when you go into labor and while you are in the hospital? . Think about what the needs of your older children might be. Who can help you meet those needs? In what ways are you helping them prepare for bringing baby home? List some specific strategies you have for family adjustment. _______________________________________________________________________________________________________________________________________________________________________________________________________________________________________________________________________________________________________________________________________________  SUPPORT *Someone who can empathize with experiences normalizes your problems and makes them more bearable.* . Make a list of other friends, neighbors, and/or co-workers you know with infants (and small children, if applicable) with whom you can connect. . Make a list of local or online support groups, mom groups, etc. in which  you can  be involved. ______________________________________________________________________________________________________________________________________________________________________________________________________________________________________________________________________________________________________________________________________________________________________________________________________  Childcare Plans . Investigate and plan for childcare if mom is returning to work. . Talk about mom's concerns about her transition back to work. . Talk about partner's concerns regarding this transition.  Mental Health *Your mental health is one of the highest priorities for a pregnant or postpartum mom.* . 1 in 5 women experience anxiety and/or depression from the time of conception through the first year after birth. . Postpartum Mood Disorders are the #1 complication of pregnancy and childbirth and the suffering experienced by these mothers is not necessary! These illnesses are temporary and respond well to treatment, which often includes self-care, social support, talk therapy, and medication when needed. . Women experiencing anxiety and depression often say things like: "I'm supposed to be happy.why do I feel so sad?", "Why can't I snap out of it?", "I'm having thoughts that scare me." . There is no need to be embarrassed if you are feeling these symptoms: o Overwhelmed, anxious, angry, sad, guilty, irritable, hopeless, exhausted but can't sleep o You are NOT alone. You are NOT to blame. With help, you WILL be well. . Where can I find help? Medical professionals such as your OB, midwife, gynecologist, family practitioner, primary care provider, pediatrician, or mental health providers; [facility] support groups: Feelings After Birth, Breastfeeding Support Group, Baby and Me Group, and Fit 4 Two exercise classes. . You have permission to ask for help. It will confirm your feelings, validate  your experiences, share/learn coping strategies, and gain support and encouragement as you heal. You are important! BRAINSTORM . Make a list of local resources, including resources for mom and for partner. . Identify support groups. . Identify people to call late at night - include names and contact info. . Talk with partner about perinatal mood and anxiety disorders. . Talk with your OB, midwife, and doula about baby blues and about perinatal mood and anxiety disorders. . Talk with your pediatrician about perinatal mood and anxiety disorders.   Support & Sanity Savers   What do you really need?  . Basics . In preparing for a new baby, many expectant parents spend hours shopping for baby clothes, decorating the nursery, and deciding which car seat to buy. Yet most don't think much about what the reality of parenting a newborn will be like, and what they need to make it through that. So, here is the advice of experienced parents. We know you'll read this, and think "they're exaggerating, I don't really need that." Just trust [place] on these, OK? Plan for all of this, and if it turns out you don't need it, come back and teach [place] how you did it!  Satira Anis (Once baby's survival needs are met, make sure you attend to your own survival needs!) . Sleep . An average newborn sleeps 16-18 hours per day, over 6-7 sleep periods, rarely more than three hours at a time. It is normal and healthy for a newborn to wake throughout the night... but really hard on parents!! . Naps. Prioritize sleep above any responsibilities like: cleaning house, visiting friends, running errands, etc.  Sleep whenever baby sleeps. If you can't nap, at least have restful times when baby eats. The more rest you get, the more patient you will be, the more emotionally stable, and better at solving problems.  . Food . You may not have realized it would be difficult to eat when you have a newborn. Yet, when we talk to . countless new  parents, they say things like "it may be 2:00 pm when I realize I haven't had breakfast yet." Or "every time we sit down to dinner, baby needs to eat, and my food gets cold, so I don't bother to eat it." . Finger food. Before your baby is born, stock up with one months' worth of food that: 1) you can eat with one hand while holding a baby, 2) doesn't need to be prepped, 3) is good hot or cold, 4) doesn't spoil when left out for a few hours, and 5) you like to eat. Think about: nuts, dried fruit, Clif bars, pretzels, jerky, gogurt, baby carrots, apples, bananas, crackers, cheez-n-crackers, string cheese, hot pockets or frozen burritos to microwave, garden burgers and breakfast pastries to put in the toaster, yogurt drinks, etc. . Restaurant Menus. Make lists of your favorite restaurants & menu items. When family/friends want to help, you can give specific information without much thought. They can either bring you the food or send gift cards for just the right meals. Rosaura Carpenter Meals.  Take some time to make a few meals to put in the freezer ahead of time.  Easy to freeze meals can be anything such as soup, lasagna, chicken pie, or spaghetti sauce. . Set up a Meal Schedule.  Ask friends and family to sign up to bring you meals during the first few weeks of being home. (It can be passed around at baby showers!) You have no idea how helpful this will be until you are in the throes of parenting.  https://[email or web address]-[email or web address] is a great website to check out. . Emotional Support . Know who to call when you're stressed out. Parenting a newborn is very challenging work. There are times when it totally overwhelms your normal coping abilities. EVERY NEW PARENT NEEDS TO HAVE A PLAN FOR WHO TO CALL WHEN THEY JUST CAN'T COPE ANY MORE. (And it has to be someone other than the baby's other parent!) Before your baby is born, come up with at least one person you can call for support - write their phone number down and post it on the  refrigerator. Marland Kitchen Anxiety & Sadness. Baby blues are normal after pregnancy; however, there are more severe types of anxiety & sadness which can occur and should not be ignored.  They are always treatable, but you have to take the first step by reaching out for help. [facility] offers a "Mom Talk" group which meets every Tuesday from 10 am - 11 am.  This group is for new moms who need support and connection after their babies are born.  Call [phone].  Marland Kitchen Really, Really Helpful (Plan for them! Make sure these happen often!!) . Physical Support with Taking Care of Yourselves . Asking friends and family. Before your baby is born, set up a schedule of people who can come and visit and help out (or ask a friend to schedule for you). Any time someone says "let me know what I can do to help," sign them up for a day. When they get there, their job is not to take care of the baby (that's your job and your joy). Their job is to take care of you!  . Postpartum doulas. If you don't have anyone you can call on for support, look into postpartum doulas:  professionals at helping parents with caring for baby, caring for themselves, getting breastfeeding started, and helping with household tasks. www.padanc.org is a helpful website for learning about doulas in our area. Marland Kitchen  Peer Support / Parent Groups . Why: One of the greatest ideas for new parents is to be around other new parents. Parent groups give you a chance to share and listen to others who are going through the same season of life, get a sense of what is normal infant development by watching several babies learn and grow, share your stories of triumph and struggles with empathetic ears, and forgive your own mistakes when you realize all parents are learning by trial and error. . Where to find: There are many places you can meet other new parents throughout our community.  [facility] offers the following classes for new moms and their little ones:  Baby  and Me (Birth to [place]) and Breastfeeding Support Group. Go to www.conehealthybaby.com or call [phone] for more information. . Time for your Relationship . It's easy to get so caught up in meeting baby's immediate needs that it's hard to find time to connect with your partner, and meet the needs of your relationship. It's also easy to forget what "quality time with your partner" actually looks like. If you take your baby on a date, you'd be amazed how much of your couple time is spent feeding the baby, diapering the baby, admiring the baby, and talking about the baby. . Dating: Try to take time for just the two of you. Babysitter tip: Sometimes when moms are breastfeeding a newborn, they find it hard to figure out how to schedule outings around baby's unpredictable feeding schedules. Have the babysitter come for a three hour period. When she comes over, if baby has just eaten, you can leave right away, and come back in two hours. If baby hasn't fed recently, you start the date at home. Once baby gets hungry and gets a good feeding in, you can head out for the rest of your date time. . Date Nights at Home: If you can't get out, at least set aside one evening a week to prioritize your relationship: whenever baby dozes off or doesn't have any immediate needs, spend a little time focusing on each other. . Potential conflicts: The main relationship conflicts that come up for new parents are: issues related to sexuality, financial stresses, a feeling of an unfair division of household tasks, and conflicts in parenting styles. The more you can work on these issues before baby arrives, the better!  Clint Guy and Frills (Don't forget these. and don't feel guilty for indulging in them!) . Everyone has something in life that is a fun little treat that they do just for themselves. It may be: reading the morning paper, or going for a daily jog, or having coffee with a friend once a week, or going to a movie on Friday  nights, or fine chocolates, or bubble baths, or curling up with a good book. . Unless you do fun things for yourself every now and then, it's hard to have the energy for fun with your baby. Whatever your "special" treats are, make sure you find a way to continue to indulge in them after your baby is born. These special moments can recharge you, and allow you to return to baby with a new joy   PERINATAL MOOD DISORDERS: [place]   Emergency and Crisis Resources:  If you are an imminent risk to self or others, are experiencing intense personal distress, and/or have noticed significant changes in activities of daily living, call:  . 911 . [facility]: [phone] .  Mobile Crisis: 407-866-0722 . National Suicide Hotline: 559-144-9527 Or visit the following crisis centers: . Local Emergency Departments . [facility]: [address], [place]. Hours: 8:30AM-5PM. Insurance Accepted: Medicaid, Medicare, and Uninsured.  Marland Kitchen [facility]  [address] [place] Mon-Friday 8am-3pm  [phone]                                                                                    Non-Crisis Resources: To identify specific providers that are covered by your insurance, contact your insurance company or local agencies: [place]: [phone] [facility] and [place]: [phone] [place]: [phone] Postpartum Support International- [place] [phone]                                                      Outpatient therapy and medication management providers:  [facility] [phone] Hours: 9AM-5PM  Insurance Accepted: Alben Spittle, Lorella Nimrod, Freddrick March, [place], Medicare  [facility] ([facility]) [phone] Hours: 8AM-5PM  nsurance Accepted: All insurances EXCEPT AARP, [place], [place], and  [place]: [phone]             Hours: 8AM-8PM Insurance Accepted: Cristal Ford, Freddrick March, [place], Medicare, [phone] [facility]: [phone] Hours: 8:30AM-7PM Insurance Accepted: Cristal Ford, Medicaid, Medicare, Tricare, [employer] Counseling:  [place] Accepted:  Holland Falling, Lorella Nimrod, [employer], [place], [facility] [phone] Hours: 9AM-5:30PM Insurance Accepted: Alben Spittle, Charlotte Crumb, and Medicaid, Medicare, [employer] [facility] Counsel[facility]:  [phone] Hours: 9am-5pm Insurance Accepted: BCBS; they do not accept Medicaid/Medicare [place]: [phone] Hours: 9am-9pm Insurance Accepted: All major insurance including Medicaid and Medicare [facility]: [phone] Hours: 9AM-5:30PM Insurance Accepted: All insurances EXCEPT Medicaid and Medicare. [facility]: [place]: [phone] [place]:  [place] (support for children in the NICU and/or with special needs), [place] [employer]: [phone]  Online Resources: Postpartum Support International: http://jones-berg.[email or web address]/  300-923-3AQT 2Moms Supporting Moms:  www.momssupportingmoms.net
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
## 2020-01-17 ENCOUNTER — Ambulatory Visit: Payer: Self-pay | Admitting: Registered"

## 2020-01-17 ENCOUNTER — Other Ambulatory Visit: Payer: Self-pay

## 2020-01-17 ENCOUNTER — Encounter: Payer: Medicaid Other | Attending: Obstetrics & Gynecology | Admitting: Registered"

## 2020-01-17 VITALS — Wt 143.9 lb

## 2020-01-17 DIAGNOSIS — O219 Vomiting of pregnancy, unspecified: Secondary | ICD-10-CM | POA: Insufficient documentation

## 2020-01-17 DIAGNOSIS — O2610 Low weight gain in pregnancy, unspecified trimester: Secondary | ICD-10-CM | POA: Insufficient documentation

## 2020-01-17 DIAGNOSIS — Z713 Dietary counseling and surveillance: Secondary | ICD-10-CM | POA: Diagnosis not present

## 2020-01-17 NOTE — Patient Instructions (Addendum)
Continue eating bland foods when you feel you are able to keep food down. Try Clear Protein and broth to help get nutrition in your body Try adding some protein powder to oatmeal to help increase protein intake Instead of ginger ale, try the ginger chews or the crystallized ginger  Alternative ideas: Accupressure  Arom[employer]rapy [email or web address]

## 2020-01-17 NOTE — Progress Notes (Signed)
Medical Nutrition Therapy  Appointment Start time:  1030  Appointment End time:  1100  Primary concerns today: hyperemesis in pregnancy  Referral diagnosis: same Preferred learning style: no preference indicated Learning readiness: ready   NUTRITION ASSESSMENT   Anthropometrics  Wt Readings from Last 3 Encounters:  01/01/20 142 lb 3.2 oz (64.5 kg)  11/06/19 138 lb 6.4 oz (62.8 kg)  11/23/13 173 lb (78.5 kg)   Clinical Medical Hx: n/v in pregnancy Medications: Reglan, Zofran, prenatal vitamins Labs: reviewed Notable Signs/Symptoms: not able to keep most foods down  Lifestyle & Dietary Hx Pt states she has tried different temperatures and textures and eating crackers and other bland food. Pt states she does not eat eggs or peanut butter. Pt states she has not been able to eat other meats either. Pt states she cannot drink nutritional supplements because they are too thick. Pt states on a good day she is able to keep down oatmeal.   Pt reports medication and ginger ale does not help resolve nausea.   24-Hr Dietary Recall First Meal: oatmeal Snack:  Second Meal:  Snack:  Third Meal:  Snack:  Beverages:   Estimated Energy Needs Calories: ~2100  NUTRITION DIAGNOSIS  NI-1.4 Inadequate energy intake As related to reduced appetite, nausea and vomiting .  As evidenced by dietary recall.   NUTRITION INTERVENTION  Nutrition education (E-1) on the following topics:  . Strategies to increase protein in diet with minimal intake . Options to help reduce nausea  Handouts Provided Include   AND Morning Sickness Nutrition Therapy  Learning Style & Readiness for Change Teaching method utilized: Visual & Auditory  Demonstrated degree of understanding via: Teach Back  Barriers to learning/adherence to lifestyle change: none  Goals Established by Pt . Try suggested products that will increase nutritional intake . Consider strategies listed for reducing nausea   MONITORING &  EVALUATION Dietary intake, weekly physical activity, and weight as needed.  Next Steps  Patient is to continue working on eating when feels able to hold food down.

## 2020-01-18 NOTE — BH Specialist Note (Signed)
Integrated Behavioral Health via Telemedicine Visit  01/18/2020 AVAEH EWER [id number]  Pt attempted to arrive to video visit and did not answer the phone; Marland KitchenLeft HIPPA-compliant message to call back Asher Muir from [facility] [employer] [facility] at [phone] (main office) or [phone] (Eboney Claybrook's office).  ; left MyChart message for patient.    NJamie C Elex Mainwaring, LCSW

## 2020-01-19 ENCOUNTER — Ambulatory Visit: Payer: Medicaid Other | Attending: Nurse Practitioner

## 2020-01-19 ENCOUNTER — Other Ambulatory Visit: Payer: Self-pay

## 2020-01-19 ENCOUNTER — Encounter: Payer: Medicaid Other | Admitting: Registered"

## 2020-01-19 DIAGNOSIS — Z3490 Encounter for supervision of normal pregnancy, unspecified, unspecified trimester: Secondary | ICD-10-CM | POA: Diagnosis not present

## 2020-01-19 DIAGNOSIS — Z3A19 19 weeks gestation of pregnancy: Secondary | ICD-10-CM

## 2020-01-19 DIAGNOSIS — O98513 Other viral diseases complicating pregnancy, third trimester: Secondary | ICD-10-CM

## 2020-01-19 DIAGNOSIS — Z369 Encounter for antenatal screening, unspecified: Secondary | ICD-10-CM | POA: Diagnosis not present

## 2020-01-19 DIAGNOSIS — B009 Herpesviral infection, unspecified: Secondary | ICD-10-CM | POA: Diagnosis not present

## 2020-01-22 ENCOUNTER — Other Ambulatory Visit: Payer: Self-pay

## 2020-01-26 ENCOUNTER — Other Ambulatory Visit: Payer: Self-pay

## 2020-01-26 ENCOUNTER — Ambulatory Visit: Payer: Medicaid Other | Admitting: Clinical

## 2020-01-26 DIAGNOSIS — Z5329 Procedure and treatment not carried out because of patient's decision for other reasons: Secondary | ICD-10-CM

## 2020-01-26 DIAGNOSIS — Z91199 Patient's noncompliance with other medical treatment and regimen due to unspecified reason: Secondary | ICD-10-CM

## 2020-01-30 ENCOUNTER — Encounter: Payer: Self-pay | Admitting: Obstetrics & Gynecology

## 2020-01-30 ENCOUNTER — Other Ambulatory Visit: Payer: Self-pay

## 2020-01-30 ENCOUNTER — Other Ambulatory Visit (HOSPITAL_COMMUNITY)
Admission: RE | Admit: 2020-01-30 | Discharge: 2020-01-30 | Disposition: A | Discharge status: Home / Self Care | Payer: Medicaid Other | Source: Ambulatory Visit | Attending: Family Medicine | Admitting: Family Medicine

## 2020-01-30 ENCOUNTER — Ambulatory Visit (INDEPENDENT_AMBULATORY_CARE_PROVIDER_SITE_OTHER): Payer: Medicaid Other | Admitting: Obstetrics & Gynecology

## 2020-01-30 VITALS — BP 110/66 | HR 79 | Wt 146.4 lb

## 2020-01-30 DIAGNOSIS — N898 Other specified noninflammatory disorders of vagina: Secondary | ICD-10-CM | POA: Diagnosis not present

## 2020-01-30 DIAGNOSIS — Z23 Encounter for immunization: Secondary | ICD-10-CM | POA: Diagnosis not present

## 2020-01-30 DIAGNOSIS — R8271 Bacteriuria: Secondary | ICD-10-CM

## 2020-01-30 DIAGNOSIS — Z3490 Encounter for supervision of normal pregnancy, unspecified, unspecified trimester: Secondary | ICD-10-CM

## 2020-01-30 DIAGNOSIS — Z8759 Personal history of other complications of pregnancy, childbirth and the puerperium: Secondary | ICD-10-CM

## 2020-01-30 DIAGNOSIS — Z3A20 20 weeks gestation of pregnancy: Secondary | ICD-10-CM

## 2020-01-30 MED ORDER — ASPIRIN EC 81 MG PO TBEC: 81.0000 mg | DELAYED_RELEASE_TABLET | Freq: Every day | ORAL | 11 refills | Status: DC

## 2020-01-30 MED ORDER — AMOXICILLIN 500 MG PO CAPS: 500.0000 mg | ORAL_CAPSULE | Freq: Three times a day (TID) | ORAL | 0 refills | Status: DC

## 2020-01-30 NOTE — Progress Notes (Signed)
Pt states did not take meds for + BV and yeast on 01/01/2020. Pt states would like meds to be sent into pharmacy today.
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
## 2020-02-01 LAB — CERVICOVAGINAL ANCILLARY ONLY
Bacterial Vaginitis (gardnerella): POSITIVE — AB
Candida Glabrata: NEGATIVE
Candida Vaginitis: POSITIVE — AB
Comment: NEGATIVE
Comment: NEGATIVE
Comment: NEGATIVE

## 2020-02-08 LAB — AFP, SERUM, OPEN SPINA BIFIDA
AFP MoM: 2.02
AFP Value: 136.5 ng/mL
Gest. Age on Collection Date: 20.4 wk
Maternal Age At EDD: 32.6 a
OSBR Risk 1 IN: 1541
Test Results:: NEGATIVE
Weight: 146 [lb_av]

## 2020-02-27 ENCOUNTER — Ambulatory Visit (INDEPENDENT_AMBULATORY_CARE_PROVIDER_SITE_OTHER): Payer: Medicaid Other | Admitting: Certified Nurse Midwife

## 2020-02-27 ENCOUNTER — Encounter: Payer: Self-pay | Admitting: Certified Nurse Midwife

## 2020-02-27 ENCOUNTER — Other Ambulatory Visit: Payer: Self-pay

## 2020-02-27 VITALS — BP 117/65 | HR 89 | Wt 150.7 lb

## 2020-02-27 DIAGNOSIS — Z3492 Encounter for supervision of normal pregnancy, unspecified, second trimester: Secondary | ICD-10-CM

## 2020-02-27 DIAGNOSIS — R8271 Bacteriuria: Secondary | ICD-10-CM

## 2020-02-27 DIAGNOSIS — Z3A24 24 weeks gestation of pregnancy: Secondary | ICD-10-CM

## 2020-02-27 NOTE — Patient Instructions (Signed)
Oral Glucose Tolerance Test During Pregnancy Why am I having this test? The oral glucose tolerance test (OGTT) is done to check how your body processes blood sugar (glucose). This is one of several tests used to diagnose diabetes that develops during pregnancy (gestational diabetes mellitus). Gestational diabetes is a short-term form of diabetes that some women develop while they are pregnant. It usually occurs during the second trimester of pregnancy and goes away after delivery. Testing, or screening, for gestational diabetes usually occurs at weeks 24-28 of pregnancy. You may have the OGTT test after having a 1-hour glucose screening test if the results from that test indicate that you may have gestational diabetes. This test may also be needed if:  You have a history of gestational diabetes.  There is a history of giving birth to very large babies or of losing pregnancies (having stillbirths).  You have signs and symptoms of diabetes, such as: ? Changes in your eyesight. ? Tingling or numbness in your hands or feet. ? Changes in hunger, thirst, and urination, and these are not explained by your pregnancy. What is being tested? This test measures the amount of glucose in your blood at different times during a period of 3 hours. This shows how well your body can process glucose. What kind of sample is taken? Blood samples are required for this test. They are usually collected by inserting a needle into a blood vessel.   How do I prepare for this test?  For 3 days before your test, eat normally. Have plenty of carbohydrate-rich foods.  Follow instructions from your health care provider about: ? Eating or drinking restrictions on the day of the test. You may be asked not to eat or drink anything other than water (to fast) starting 8-10 hours before the test. ? Changing or stopping your regular medicines. Some medicines may interfere with this test. Tell a health care provider about:  All  medicines you are taking, including vitamins, herbs, eye drops, creams, and over-the-counter medicines.  Any blood disorders you have.  Any surgeries you have had.  Any medical conditions you have. What happens during the test? First, your blood glucose will be measured. This is referred to as your fasting blood glucose because you fasted before the test. Then, you will drink a glucose solution that contains a certain amount of glucose. Your blood glucose will be measured again 1, 2, and 3 hours after you drink the solution. This test takes about 3 hours to complete. You will need to stay at the testing location during this time. During the testing period:  Do not eat or drink anything other than the glucose solution.  Do not exercise.  Do not use any products that contain nicotine or tobacco, such as cigarettes, e-cigarettes, and chewing tobacco. These can affect your test results. If you need help quitting, ask your health care provider. The testing procedure may vary among health care providers and hospitals. How are the results reported? Your results will be reported as milligrams of glucose per deciliter of blood (mg/dL) or millimoles per liter (mmol/L). There is more than one source for screening and diagnosis reference values used to diagnose gestational diabetes. Your health care provider will compare your results to normal values that were established after testing a large group of people (reference values). Reference values may vary among labs and hospitals. For this test ([place]), reference values are:  Fasting: 95 mg/dL (5.3 mmol/L).  1 hour: 180 mg/dL (10.0 mmol/L).  2 hour:   155 mg/dL (8.6 mmol/L).  3 hour: 140 mg/dL (7.8 mmol/L). What do the results mean? Results below the reference values are considered normal. If two or more of your blood glucose levels are at or above the reference values, you may be diagnosed with gestational diabetes. If only one level is  high, your health care provider may suggest repeat testing or other tests to confirm a diagnosis. Talk with your health care provider about what your results mean. Questions to ask your health care provider Ask your health care provider, or the department that is doing the test:  When will my results be ready?  How will I get my results?  What are my treatment options?  What other tests do I need?  What are my next steps? Summary  The oral glucose tolerance test (OGTT) is one of several tests used to diagnose diabetes that develops during pregnancy (gestational diabetes mellitus). Gestational diabetes is a short-term form of diabetes that some women develop while they are pregnant.  You may have the OGTT test after having a 1-hour glucose screening test if the results from that test show that you may have gestational diabetes. You may also have this test if you have any symptoms or risk factors for this type of diabetes.  Talk with your health care provider about what your results mean. This information is not intended to replace advice given to you by your health care provider. Make sure you discuss any questions you have with your health care provider. Document Revised: 06/08/2019 Document Reviewed: 06/08/2019 Elsevier Patient Education  2021 Elsevier Inc.  
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
## 2020-03-15 ENCOUNTER — Encounter: Payer: Self-pay | Admitting: *Deleted

## 2020-03-26 ENCOUNTER — Other Ambulatory Visit: Payer: Self-pay | Admitting: Lactation Services

## 2020-03-26 ENCOUNTER — Encounter: Payer: Self-pay | Admitting: Advanced Practice Midwife

## 2020-03-26 ENCOUNTER — Other Ambulatory Visit: Payer: Medicaid Other

## 2020-03-26 ENCOUNTER — Other Ambulatory Visit: Payer: Self-pay

## 2020-03-26 ENCOUNTER — Ambulatory Visit (INDEPENDENT_AMBULATORY_CARE_PROVIDER_SITE_OTHER): Payer: Medicaid Other | Admitting: Advanced Practice Midwife

## 2020-03-26 VITALS — BP 100/60 | HR 84 | Wt 155.2 lb

## 2020-03-26 DIAGNOSIS — Z23 Encounter for immunization: Secondary | ICD-10-CM | POA: Diagnosis not present

## 2020-03-26 DIAGNOSIS — Z3492 Encounter for supervision of normal pregnancy, unspecified, second trimester: Secondary | ICD-10-CM

## 2020-03-26 DIAGNOSIS — Z3A28 28 weeks gestation of pregnancy: Secondary | ICD-10-CM | POA: Diagnosis not present

## 2020-03-26 DIAGNOSIS — O26893 Other specified pregnancy related conditions, third trimester: Secondary | ICD-10-CM

## 2020-03-26 DIAGNOSIS — Z3490 Encounter for supervision of normal pregnancy, unspecified, unspecified trimester: Secondary | ICD-10-CM | POA: Diagnosis not present

## 2020-03-26 DIAGNOSIS — M545 Low back pain, unspecified: Secondary | ICD-10-CM

## 2020-03-26 DIAGNOSIS — M25571 Pain in right ankle and joints of right foot: Secondary | ICD-10-CM

## 2020-03-26 NOTE — Patient Instructions (Signed)
Third Trimester of Pregnancy  The third trimester of pregnancy is from week 28 through week 40. This is also called months 7 through 9. This trimester is when your unborn baby (fetus) is growing very fast. At the end of the ninth month, the unborn baby is about 20 inches long. It weighs about 6-10 pounds. Body changes during your third trimester Your body continues to go through many changes during this time. The changes vary and generally return to normal after the baby is born. Physical changes  Your weight will continue to increase. You may gain 25-35 pounds (11-16 kg) by the end of the pregnancy. If you are underweight, you may gain 28-40 lb (about 13-18 kg). If you are overweight, you may gain 15-25 lb (about 7-11 kg).  You may start to get stretch marks on your hips, belly (abdomen), and breasts.  Your breasts will continue to grow and may hurt. A yellow fluid (colostrum) may leak from your breasts. This is the first milk you are making for your baby.  You may have changes in your hair.  Your belly button may stick out.  You may have more swelling in your hands, face, or ankles. Health changes  You may have heartburn.  You may have trouble pooping (constipation).  You may get hemorrhoids. These are swollen veins in the butt that can itch or get painful.  You may have swollen veins (varicose veins) in your legs.  You may have more body aches in the pelvis, back, or thighs.  You may have more tingling or numbness in your hands, arms, and legs. The skin on your belly may also feel numb.  You may feel short of breath as your womb (uterus) gets bigger. Other changes  You may pee (urinate) more often.  You may have more problems sleeping.  You may notice the unborn baby "dropping," or moving lower in your belly.  You may have more discharge coming from your vagina.  Your joints may feel loose, and you may have pain around your pelvic bone. Follow these instructions at  home: Medicines  Take over-the-counter and prescription medicines only as told by your doctor. Some medicines are not safe during pregnancy.  Take a prenatal vitamin that contains at least 600 micrograms (mcg) of folic acid. Eating and drinking  Eat healthy meals that include: ? Fresh fruits and vegetables. ? Whole grains. ? Good sources of protein, such as meat, eggs, or tofu. ? Low-fat dairy products.  Avoid raw meat and unpasteurized juice, milk, and cheese. These carry germs that can harm you and your baby.  Eat 4 or 5 small meals rather than 3 large meals a day.  You may need to take these actions to prevent or treat trouble pooping: ? Drink enough fluids to keep your pee (urine) pale yellow. ? Eat foods that are high in fiber. These include beans, whole grains, and fresh fruits and vegetables. ? Limit foods that are high in fat and sugar. These include fried or sweet foods. Activity  Exercise only as told by your doctor. Stop exercising if you start to have cramps in your womb.  Avoid heavy lifting.  Do not exercise if it is too hot or too humid, or if you are in a place of great height (high altitude).  If you choose to, you may have sex unless your doctor tells you not to. Relieving pain and discomfort  Take breaks often, and rest with your legs raised (elevated) if you have   leg cramps or low back pain.  Take warm water baths (sitz baths) to soothe pain or discomfort caused by hemorrhoids. Use hemorrhoid cream if your doctor approves.  Wear a good support bra if your breasts are tender.  If you develop bulging, swollen veins in your legs: ? Wear support hose as told by your doctor. ? Raise your feet for 15 minutes, 3-4 times a day. ? Limit salt in your food. Safety  Talk to your doctor before traveling far distances.  Do not use hot tubs, steam rooms, or saunas.  Wear your seat belt at all times when you are in a car.  Talk with your doctor if someone is  hurting you or yelling at you a lot. Preparing for your baby's arrival To prepare for the arrival of your baby:  Take prenatal classes.  Visit the hospital and tour the maternity area.  Buy a rear-facing car seat. Learn how to install it in your car.  Prepare the baby's room. Take out all pillows and stuffed animals from the baby's crib. General instructions  Avoid cat litter boxes and soil used by cats. These carry germs that can cause harm to the baby and can cause a loss of your baby by miscarriage or stillbirth.  Do not douche or use tampons. Do not use scented sanitary pads.  Do not smoke or use any products that contain nicotine or tobacco. If you need help quitting, ask your doctor.  Do not drink alcohol.  Do not use herbal medicines, illegal drugs, or medicines that were not approved by your doctor. Chemicals in these products can affect your baby.  Keep all follow-up visits. This is important. Where to find more information  American Pregnancy Association: americanpregnancy.org  American College of Obstetricians and Gynecologists: www.acog.org  Office on Women's Health: womenshealth.gov/pregnancy Contact a doctor if:  You have a fever.  You have mild cramps or pressure in your lower belly.  You have a nagging pain in your belly area.  You vomit, or you have watery poop (diarrhea).  You have bad-smelling fluid coming from your vagina.  You have pain when you pee, or your pee smells bad.  You have a headache that does not go away when you take medicine.  You have changes in how you see, or you see spots in front of your eyes. Get help right away if:  Your water breaks.  You have regular contractions that are less than 5 minutes apart.  You are spotting or bleeding from your vagina.  You have very bad belly cramps or pain.  You have trouble breathing.  You have chest pain.  You faint.  You have not felt the baby move for the amount of time told by  your doctor.  You have new or increased pain, swelling, or redness in an arm or leg. Summary  The third trimester is from week 28 through week 40 (months 7 through 9). This is the time when your unborn baby is growing very fast.  During this time, your discomfort may increase as you gain weight and as your baby grows.  Get ready for your baby to arrive by taking prenatal classes, buying a rear-facing car seat, and preparing the baby's room.  Get help right away if you are bleeding from your vagina, you have chest pain and trouble breathing, or you have not felt the baby move for the amount of time told by your doctor. This information is not intended to replace advice given   to you by your health care provider. Make sure you discuss any questions you have with your health care provider. Document Revised: 06/07/2019 Document Reviewed: 04/13/2019 Elsevier Patient Education  2021 [employer]   Fetal Movement Counts Patient Name: ________________________________________________ Patient Due Date: ____________________  What is a fetal movement count? A fetal movement count is the number of times that you feel your baby move during a certain amount of time. This may also be called a fetal kick count. A fetal movement count is recommended for every pregnant woman. You may be asked to start counting fetal movements as early as week 28 of your pregnancy. Pay attention to when your baby is most active. You may notice your baby's sleep and wake cycles. You may also notice things that make your baby move more. You should do a fetal movement count:  When your baby is normally most active.  At the same time each day. A good time to count movements is while you are resting, after having something to eat and drink. How do I count fetal movements? 1. Find a quiet, comfortable area. Sit, or lie down on your side. 2. Write down the date, the start time and stop time, and the number of movements that you  felt between those two times. Take this information with you to your health care visits. 3. Write down your start time when you feel the first movement. 4. Count kicks, flutters, swishes, rolls, and jabs. You should feel at least 10 movements. 5. You may stop counting after you have felt 10 movements, or if you have been counting for 2 hours. Write down the stop time. 6. If you do not feel 10 movements in 2 hours, contact your health care provider for further instructions. Your health care provider may want to do additional tests to assess your baby's well-being. Contact a health care provider if:  You feel fewer than 10 movements in 2 hours.  Your baby is not moving like he or she usually does. Date: ____________ Start time: ____________ Stop time: ____________ Movements: ____________ Date: ____________ Start time: ____________ Stop time: ____________ Movements: ____________ Date: ____________ Start time: ____________ Stop time: ____________ Movements: ____________ Date: ____________ Start time: ____________ Stop time: ____________ Movements: ____________ Date: ____________ Start time: ____________ Stop time: ____________ Movements: ____________ Date: ____________ Start time: ____________ Stop time: ____________ Movements: ____________ Date: ____________ Start time: ____________ Stop time: ____________ Movements: ____________ Date: ____________ Start time: ____________ Stop time: ____________ Movements: ____________ Date: ____________ Start time: ____________ Stop time: ____________ Movements: ____________ This information is not intended to replace advice given to you by your health care provider. Make sure you discuss any questions you have with your health care provider. Document Revised: 08/18/2018 Document Reviewed: 08/18/2018 Elsevier Patient Education  2021 [employer] Inc.  

## 2020-03-26 NOTE — Progress Notes (Signed)
Patient complains of lower back pain when she is moving and pain in vaginal area when baby is moving. Also states she has discomfort in right ankle, feel like it needs to pop

## 2020-03-26 NOTE — Progress Notes (Signed)
Medicaid Home Form completed-03/26/20
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
## 2020-03-27 LAB — CBC
Hematocrit: 26.7 % — ABNORMAL LOW (ref 34.0–46.6)
Hemoglobin: 8.9 g/dL — ABNORMAL LOW (ref 11.1–15.9)
MCH: 28.7 pg (ref 26.6–33.0)
MCHC: 33.3 g/dL (ref 31.5–35.7)
MCV: 86 fL (ref 79–97)
Platelets: 112 10*3/uL — ABNORMAL LOW (ref 150–450)
RBC: 3.1 x10E6/uL — ABNORMAL LOW (ref 3.77–5.28)
RDW: 12.5 % (ref 11.7–15.4)
WBC: 11.3 10*3/uL — ABNORMAL HIGH (ref 3.4–10.8)

## 2020-03-27 LAB — GLUCOSE TOLERANCE, 2 HOURS W/ 1HR
Glucose, 1 hour: 129 mg/dL (ref 65–179)
Glucose, 2 hour: 106 mg/dL (ref 65–152)
Glucose, Fasting: 76 mg/dL (ref 65–91)

## 2020-03-27 LAB — RPR: RPR Ser Ql: NONREACTIVE

## 2020-03-27 LAB — HIV ANTIBODY (ROUTINE TESTING W REFLEX): HIV Screen 4th Generation wRfx: NONREACTIVE

## 2020-04-09 ENCOUNTER — Other Ambulatory Visit: Payer: Self-pay

## 2020-04-09 ENCOUNTER — Encounter: Payer: Self-pay | Admitting: Family Medicine

## 2020-04-09 ENCOUNTER — Ambulatory Visit (INDEPENDENT_AMBULATORY_CARE_PROVIDER_SITE_OTHER): Payer: Medicaid Other | Admitting: Family Medicine

## 2020-04-09 VITALS — BP 76/45 | HR 85 | Wt 156.8 lb

## 2020-04-09 DIAGNOSIS — O99013 Anemia complicating pregnancy, third trimester: Secondary | ICD-10-CM

## 2020-04-09 DIAGNOSIS — Z3490 Encounter for supervision of normal pregnancy, unspecified, unspecified trimester: Secondary | ICD-10-CM

## 2020-04-09 DIAGNOSIS — O99019 Anemia complicating pregnancy, unspecified trimester: Secondary | ICD-10-CM | POA: Insufficient documentation

## 2020-04-09 DIAGNOSIS — R8271 Bacteriuria: Secondary | ICD-10-CM

## 2020-04-09 DIAGNOSIS — Z8759 Personal history of other complications of pregnancy, childbirth and the puerperium: Secondary | ICD-10-CM

## 2020-04-09 DIAGNOSIS — D696 Thrombocytopenia, unspecified: Secondary | ICD-10-CM
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
## 2020-04-09 NOTE — Progress Notes (Signed)
Complains of pelvic pressure, rates it about a 4, that started 2 weeks. Also, complains of "hot flashes" that she has every 2-3 days, that started about 3 weeks ago. Patient has no concerns

## 2020-04-09 NOTE — Patient Instructions (Signed)
 Contraception Choices Contraception, also called birth control, refers to methods or devices that prevent pregnancy. Hormonal methods Contraceptive implant A contraceptive implant is a thin, plastic tube that contains a hormone that prevents pregnancy. It is different from an intrauterine device (IUD). It is inserted into the upper part of the arm by a health care provider. Implants can be effective for up to 3 years. Progestin-only injections Progestin-only injections are injections of progestin, a synthetic form of the hormone progesterone. They are given every 3 months by a health care provider. Birth control pills Birth control pills are pills that contain hormones that prevent pregnancy. They must be taken once a day, preferably at the same time each day. A prescription is needed to use this method of contraception. Birth control patch The birth control patch contains hormones that prevent pregnancy. It is placed on the skin and must be changed once a week for three weeks and removed on the fourth week. A prescription is needed to use this method of contraception. Vaginal ring A vaginal ring contains hormones that prevent pregnancy. It is placed in the vagina for three weeks and removed on the fourth week. After that, the process is repeated with a new ring. A prescription is needed to use this method of contraception. Emergency contraceptive Emergency contraceptives prevent pregnancy after unprotected sex. They come in pill form and can be taken up to 5 days after sex. They work best the sooner they are taken after having sex. Most emergency contraceptives are available without a prescription. This method should not be used as your only form of birth control.   Barrier methods Female condom A female condom is a thin sheath that is worn over the penis during sex. Condoms keep sperm from going inside a woman's body. They can be used with a sperm-killing substance (spermicide) to increase their  effectiveness. They should be thrown away after one use. Female condom A female condom is a soft, loose-fitting sheath that is put into the vagina before sex. The condom keeps sperm from going inside a woman's body. They should be thrown away after one use. Diaphragm A diaphragm is a soft, dome-shaped barrier. It is inserted into the vagina before sex, along with a spermicide. The diaphragm blocks sperm from entering the uterus, and the spermicide kills sperm. A diaphragm should be left in the vagina for 6-8 hours after sex and removed within 24 hours. A diaphragm is prescribed and fitted by a health care provider. A diaphragm should be replaced every 1-2 years, after giving birth, after gaining more than 15 lb (6.8 kg), and after pelvic surgery. Cervical cap A cervical cap is a round, soft latex or plastic cup that fits over the cervix. It is inserted into the vagina before sex, along with spermicide. It blocks sperm from entering the uterus. The cap should be left in place for 6-8 hours after sex and removed within 48 hours. A cervical cap must be prescribed and fitted by a health care provider. It should be replaced every 2 years. Sponge A sponge is a soft, circular piece of polyurethane foam with spermicide in it. The sponge helps block sperm from entering the uterus, and the spermicide kills sperm. To use it, you make it wet and then insert it into the vagina. It should be inserted before sex, left in for at least 6 hours after sex, and removed and thrown away within 30 hours. Spermicides Spermicides are chemicals that kill or block sperm from entering the   cervix and uterus. They can come as a cream, jelly, suppository, foam, or tablet. A spermicide should be inserted into the vagina with an applicator at least 10-15 minutes before sex to allow time for it to work. The process must be repeated every time you have sex. Spermicides do not require a prescription.   Intrauterine  contraception Intrauterine device (IUD) An IUD is a T-shaped device that is put in a woman's uterus. There are two types:  Hormone IUD.This type contains progestin, a synthetic form of the hormone progesterone. This type can stay in place for 3-5 years.  Copper IUD.This type is wrapped in copper wire. It can stay in place for 10 years. Permanent methods of contraception Female tubal ligation In this method, a woman's fallopian tubes are sealed, tied, or blocked during surgery to prevent eggs from traveling to the uterus. Hysteroscopic sterilization In this method, a small, flexible insert is placed into each fallopian tube. The inserts cause scar tissue to form in the fallopian tubes and block them, so sperm cannot reach an egg. The procedure takes about 3 months to be effective. Another form of birth control must be used during those 3 months. Female sterilization This is a procedure to tie off the tubes that carry sperm (vasectomy). After the procedure, the man can still ejaculate fluid (semen). Another form of birth control must be used for 3 months after the procedure. Natural planning methods Natural family planning In this method, a couple does not have sex on days when the woman could become pregnant. Calendar method In this method, the woman keeps track of the length of each menstrual cycle, identifies the days when pregnancy can happen, and does not have sex on those days. Ovulation method In this method, a couple avoids sex during ovulation. Symptothermal method This method involves not having sex during ovulation. The woman typically checks for ovulation by watching changes in her temperature and in the consistency of cervical mucus. Post-ovulation method In this method, a couple waits to have sex until after ovulation. Where to find more information  Centers for Disease Control and Prevention: www.cdc.gov Summary  Contraception, also called birth control, refers to methods or  devices that prevent pregnancy.  Hormonal methods of contraception include implants, injections, pills, patches, vaginal rings, and emergency contraceptives.  Barrier methods of contraception can include female condoms, female condoms, diaphragms, cervical caps, sponges, and spermicides.  There are two types of IUDs (intrauterine devices). An IUD can be put in a woman's uterus to prevent pregnancy for 3-5 years.  Permanent sterilization can be done through a procedure for males and females. Natural family planning methods involve nothaving sex on days when the woman could become pregnant. This information is not intended to replace advice given to you by your health care provider. Make sure you discuss any questions you have with your health care provider. Document Revised: 06/05/2019 Document Reviewed: 06/05/2019 Elsevier Patient Education  2021 Elsevier Inc.   Breastfeeding  Choosing to breastfeed is one of the best decisions you can make for yourself and your baby. A change in hormones during pregnancy causes your breasts to make breast milk in your milk-producing glands. Hormones prevent breast milk from being released before your baby is born. They also prompt milk flow after birth. Once breastfeeding has begun, thoughts of your baby, as well as his or her sucking or crying, can stimulate the release of milk from your milk-producing glands. Benefits of breastfeeding Research shows that breastfeeding offers many health benefits   for infants and mothers. It also offers a cost-free and convenient way to feed your baby. For your baby  Your first milk (colostrum) helps your baby's digestive system to function better.  Special cells in your milk (antibodies) help your baby to fight off infections.  Breastfed babies are less likely to develop asthma, allergies, obesity, or type 2 diabetes. They are also at lower risk for sudden infant death syndrome (SIDS).  Nutrients in breast milk are better  able to meet your baby's needs compared to infant formula.  Breast milk improves your baby's brain development. For you  Breastfeeding helps to create a very special bond between you and your baby.  Breastfeeding is convenient. Breast milk costs nothing and is always available at the correct temperature.  Breastfeeding helps to burn calories. It helps you to lose the weight that you gained during pregnancy.  Breastfeeding makes your uterus return faster to its size before pregnancy. It also slows bleeding (lochia) after you give birth.  Breastfeeding helps to lower your risk of developing type 2 diabetes, osteoporosis, rheumatoid arthritis, cardiovascular disease, and breast, ovarian, uterine, and endometrial cancer later in life. Breastfeeding basics Starting breastfeeding  Find a comfortable place to sit or lie down, with your neck and back well-supported.  Place a pillow or a rolled-up blanket under your baby to bring him or her to the level of your breast (if you are seated). Nursing pillows are specially designed to help support your arms and your baby while you breastfeed.  Make sure that your baby's tummy (abdomen) is facing your abdomen.  Gently massage your breast. With your fingertips, massage from the outer edges of your breast inward toward the nipple. This encourages milk flow. If your milk flows slowly, you may need to continue this action during the feeding.  Support your breast with 4 fingers underneath and your thumb above your nipple (make the letter "C" with your hand). Make sure your fingers are well away from your nipple and your baby's mouth.  Stroke your baby's lips gently with your finger or nipple.  When your baby's mouth is open wide enough, quickly bring your baby to your breast, placing your entire nipple and as much of the areola as possible into your baby's mouth. The areola is the colored area around your nipple. ? More areola should be visible above your  baby's upper lip than below the lower lip. ? Your baby's lips should be opened and extended outward (flanged) to ensure an adequate, comfortable latch. ? Your baby's tongue should be between his or her lower gum and your breast.  Make sure that your baby's mouth is correctly positioned around your nipple (latched). Your baby's lips should create a seal on your breast and be turned out (everted).  It is common for your baby to suck about 2-3 minutes in order to start the flow of breast milk. Latching Teaching your baby how to latch onto your breast properly is very important. An improper latch can cause nipple pain, decreased milk supply, and poor weight gain in your baby. Also, if your baby is not latched onto your nipple properly, he or she may swallow some air during feeding. This can make your baby fussy. Burping your baby when you switch breasts during the feeding can help to get rid of the air. However, teaching your baby to latch on properly is still the best way to prevent fussiness from swallowing air while breastfeeding. Signs that your baby has successfully latched onto   your nipple  Silent tugging or silent sucking, without causing you pain. Infant's lips should be extended outward (flanged).  Swallowing heard between every 3-4 sucks once your milk has started to flow (after your let-down milk reflex occurs).  Muscle movement above and in front of his or her ears while sucking. Signs that your baby has not successfully latched onto your nipple  Sucking sounds or smacking sounds from your baby while breastfeeding.  Nipple pain. If you think your baby has not latched on correctly, slip your finger into the corner of your baby's mouth to break the suction and place it between your baby's gums. Attempt to start breastfeeding again. Signs of successful breastfeeding Signs from your baby  Your baby will gradually decrease the number of sucks or will completely stop sucking.  Your baby  will fall asleep.  Your baby's body will relax.  Your baby will retain a small amount of milk in his or her mouth.  Your baby will let go of your breast by himself or herself. Signs from you  Breasts that have increased in firmness, weight, and size 1-3 hours after feeding.  Breasts that are softer immediately after breastfeeding.  Increased milk volume, as well as a change in milk consistency and color by the fifth day of breastfeeding.  Nipples that are not sore, cracked, or bleeding. Signs that your baby is getting enough milk  Wetting at least 1-2 diapers during the first 24 hours after birth.  Wetting at least 5-6 diapers every 24 hours for the first week after birth. The urine should be clear or pale yellow by the age of 5 days.  Wetting 6-8 diapers every 24 hours as your baby continues to grow and develop.  At least 3 stools in a 24-hour period by the age of 5 days. The stool should be soft and yellow.  At least 3 stools in a 24-hour period by the age of 7 days. The stool should be seedy and yellow.  No loss of weight greater than 10% of birth weight during the first 3 days of life.  Average weight gain of 4-7 oz (113-198 g) per week after the age of 4 days.  Consistent daily weight gain by the age of 5 days, without weight loss after the age of 2 weeks. After a feeding, your baby may spit up a small amount of milk. This is normal. Breastfeeding frequency and duration Frequent feeding will help you make more milk and can prevent sore nipples and extremely full breasts (breast engorgement). Breastfeed when you feel the need to reduce the fullness of your breasts or when your baby shows signs of hunger. This is called "breastfeeding on demand." Signs that your baby is hungry include:  Increased alertness, activity, or restlessness.  Movement of the head from side to side.  Opening of the mouth when the corner of the mouth or cheek is stroked (rooting).  Increased  sucking sounds, smacking lips, cooing, sighing, or squeaking.  Hand-to-mouth movements and sucking on fingers or hands.  Fussing or crying. Avoid introducing a pacifier to your baby in the first 4-6 weeks after your baby is born. After this time, you may choose to use a pacifier. Research has shown that pacifier use during the first year of a baby's life decreases the risk of sudden infant death syndrome (SIDS). Allow your baby to feed on each breast as long as he or she wants. When your baby unlatches or falls asleep while feeding from the   first breast, offer the second breast. Because newborns are often sleepy in the first few weeks of life, you may need to awaken your baby to get him or her to feed. Breastfeeding times will vary from baby to baby. However, the following rules can serve as a guide to help you make sure that your baby is properly fed:  Newborns (babies 4 weeks of age or younger) may breastfeed every 1-3 hours.  Newborns should not go without breastfeeding for longer than 3 hours during the day or 5 hours during the night.  You should breastfeed your baby a minimum of 8 times in a 24-hour period. Breast milk pumping Pumping and storing breast milk allows you to make sure that your baby is exclusively fed your breast milk, even at times when you are unable to breastfeed. This is especially important if you go back to work while you are still breastfeeding, or if you are not able to be present during feedings. Your lactation consultant can help you find a method of pumping that works best for you and give you guidelines about how long it is safe to store breast milk.      Caring for your breasts while you breastfeed Nipples can become dry, cracked, and sore while breastfeeding. The following recommendations can help keep your breasts moisturized and healthy:  Avoid using soap on your nipples.  Wear a supportive bra designed especially for nursing. Avoid wearing underwire-style  bras or extremely tight bras (sports bras).  Air-dry your nipples for 3-4 minutes after each feeding.  Use only cotton bra pads to absorb leaked breast milk. Leaking of breast milk between feedings is normal.  Use lanolin on your nipples after breastfeeding. Lanolin helps to maintain your skin's normal moisture barrier. Pure lanolin is not harmful (not toxic) to your baby. You may also hand express a few drops of breast milk and gently massage that milk into your nipples and allow the milk to air-dry. In the first few weeks after giving birth, some women experience breast engorgement. Engorgement can make your breasts feel heavy, warm, and tender to the touch. Engorgement peaks within 3-5 days after you give birth. The following recommendations can help to ease engorgement:  Completely empty your breasts while breastfeeding or pumping. You may want to start by applying warm, moist heat (in the shower or with warm, water-soaked hand towels) just before feeding or pumping. This increases circulation and helps the milk flow. If your baby does not completely empty your breasts while breastfeeding, pump any extra milk after he or she is finished.  Apply ice packs to your breasts immediately after breastfeeding or pumping, unless this is too uncomfortable for you. To do this: ? Put ice in a plastic bag. ? Place a towel between your skin and the bag. ? Leave the ice on for 20 minutes, 2-3 times a day.  Make sure that your baby is latched on and positioned properly while breastfeeding. If engorgement persists after 48 hours of following these recommendations, contact your health care provider or a lactation consultant. Overall health care recommendations while breastfeeding  Eat 3 healthy meals and 3 snacks every day. Well-nourished mothers who are breastfeeding need an additional 450-500 calories a day. You can meet this requirement by increasing the amount of a balanced diet that you eat.  Drink  enough water to keep your urine pale yellow or clear.  Rest often, relax, and continue to take your prenatal vitamins to prevent fatigue, stress, and low   vitamin and mineral levels in your body (nutrient deficiencies).  Do not use any products that contain nicotine or tobacco, such as cigarettes and e-cigarettes. Your baby may be harmed by chemicals from cigarettes that pass into breast milk and exposure to secondhand smoke. If you need help quitting, ask your health care provider.  Avoid alcohol.  Do not use illegal drugs or marijuana.  Talk with your health care provider before taking any medicines. These include over-the-counter and prescription medicines as well as vitamins and herbal supplements. Some medicines that may be harmful to your baby can pass through breast milk.  It is possible to become pregnant while breastfeeding. If birth control is desired, ask your health care provider about options that will be safe while breastfeeding your baby. Where to find more information: [employer]: www.llli.org Contact a health care provider if:  You feel like you want to stop breastfeeding or have become frustrated with breastfeeding.  Your nipples are cracked or bleeding.  Your breasts are red, tender, or warm.  You have: ? Painful breasts or nipples. ? A swollen area on either breast. ? A fever or chills. ? Nausea or vomiting. ? Drainage other than breast milk from your nipples.  Your breasts do not become full before feedings by the fifth day after you give birth.  You feel sad and depressed.  Your baby is: ? Too sleepy to eat well. ? Having trouble sleeping. ? More than 1 week old and wetting fewer than 6 diapers in a 24-hour period. ? Not gaining weight by 5 days of age.  Your baby has fewer than 3 stools in a 24-hour period.  Your baby's skin or the white parts of his or her eyes become yellow. Get help right away if:  Your baby is overly tired  (lethargic) and does not want to wake up and feed.  Your baby develops an unexplained fever. Summary  Breastfeeding offers many health benefits for infant and mothers.  Try to breastfeed your infant when he or she shows early signs of hunger.  Gently tickle or stroke your baby's lips with your finger or nipple to allow the baby to open his or her mouth. Bring the baby to your breast. Make sure that much of the areola is in your baby's mouth. Offer one side and burp the baby before you offer the other side.  Talk with your health care provider or lactation consultant if you have questions or you face problems as you breastfeed. This information is not intended to replace advice given to you by your health care provider. Make sure you discuss any questions you have with your health care provider. Document Revised: 03/25/2017 Document Reviewed: 01/31/2016 Elsevier Patient Education  2021 Elsevier Inc.  

## 2020-04-19 ENCOUNTER — Ambulatory Visit (HOSPITAL_COMMUNITY)
Admission: RE | Admit: 2020-04-19 | Discharge: 2020-04-19 | Disposition: A | Discharge status: Home / Self Care | Payer: Medicaid Other | Source: Ambulatory Visit | Attending: Family Medicine | Admitting: Family Medicine

## 2020-04-19 ENCOUNTER — Other Ambulatory Visit: Payer: Self-pay

## 2020-04-19 DIAGNOSIS — D649 Anemia, unspecified: Secondary | ICD-10-CM | POA: Insufficient documentation

## 2020-04-19 DIAGNOSIS — O99019 Anemia complicating pregnancy, unspecified trimester: Secondary | ICD-10-CM | POA: Insufficient documentation

## 2020-04-19 DIAGNOSIS — Z3A00 Weeks of gestation of pregnancy not specified: Secondary | ICD-10-CM | POA: Insufficient documentation

## 2020-04-19 LAB — FERRITIN: Ferritin: 5 ng/mL — ABNORMAL LOW (ref 11–307)

## 2020-04-19 LAB — IRON: Iron: 25 ug/dL — ABNORMAL LOW (ref 28–170)

## 2020-04-19 MED ORDER — SODIUM CHLORIDE 0.9 % IV BOLUS: 500.0000 mL | Freq: Once | INTRAVENOUS | Status: DC | PRN

## 2020-04-19 MED ORDER — METHYLPREDNISOLONE SODIUM SUCC 125 MG IJ SOLR: 125.0000 mg | Freq: Once | INTRAMUSCULAR | Status: DC | PRN

## 2020-04-19 MED ORDER — ALBUTEROL SULFATE (2.5 MG/3ML) 0.083% IN NEBU
2.5000 mg | INHALATION_SOLUTION | Freq: Once | RESPIRATORY_TRACT | Status: DC | PRN
Start: 2020-04-19 — End: 2020-04-20

## 2020-04-19 MED ORDER — SODIUM CHLORIDE 0.9 % IV SOLN: INTRAVENOUS | Status: DC | PRN

## 2020-04-19 MED ORDER — DIPHENHYDRAMINE HCL 50 MG/ML IJ SOLN: 25.0000 mg | Freq: Once | INTRAMUSCULAR | Status: DC | PRN

## 2020-04-19 MED ORDER — EPINEPHRINE PF 1 MG/ML IJ SOLN: 0.3000 mg | Freq: Once | INTRAMUSCULAR | Status: DC | PRN

## 2020-04-19 MED ORDER — SODIUM CHLORIDE 0.9 % IV SOLN
500.0000 mg | Freq: Once | INTRAVENOUS | Status: AC
  Administered 2020-04-19: 500 mg via INTRAVENOUS
  Filled 2020-04-19: qty 25

## 2020-04-19 NOTE — Discharge Instructions (Signed)

## 2020-04-24 ENCOUNTER — Ambulatory Visit (INDEPENDENT_AMBULATORY_CARE_PROVIDER_SITE_OTHER): Payer: Medicaid Other | Admitting: Certified Nurse Midwife

## 2020-04-24 ENCOUNTER — Other Ambulatory Visit: Payer: Self-pay

## 2020-04-24 VITALS — BP 119/67 | HR 78 | Wt 165.0 lb

## 2020-04-24 DIAGNOSIS — D508 Other iron deficiency anemias: Secondary | ICD-10-CM

## 2020-04-24 DIAGNOSIS — N898 Other specified noninflammatory disorders of vagina: Secondary | ICD-10-CM

## 2020-04-24 DIAGNOSIS — Z3493 Encounter for supervision of normal pregnancy, unspecified, third trimester: Secondary | ICD-10-CM

## 2020-04-24 DIAGNOSIS — Z3A32 32 weeks gestation of pregnancy: Secondary | ICD-10-CM
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
## 2020-04-24 NOTE — Patient Instructions (Signed)

## 2020-04-25 LAB — CERVICOVAGINAL ANCILLARY ONLY
Bacterial Vaginitis (gardnerella): POSITIVE — AB
Candida Glabrata: NEGATIVE
Candida Vaginitis: POSITIVE — AB
Chlamydia: NEGATIVE
Comment: NEGATIVE
Comment: NEGATIVE
Comment: NEGATIVE
Comment: NEGATIVE
Comment: NEGATIVE
Comment: NORMAL
Neisseria Gonorrhea: NEGATIVE
Trichomonas: NEGATIVE

## 2020-04-25 MED ORDER — TERCONAZOLE 0.4 % VA CREA: 1.0000 | TOPICAL_CREAM | Freq: Every day | VAGINAL | 0 refills | Status: DC

## 2020-04-25 MED ORDER — METRONIDAZOLE 500 MG PO TABS: 500.0000 mg | ORAL_TABLET | Freq: Two times a day (BID) | ORAL | 0 refills | Status: DC

## 2020-04-25 NOTE — Addendum Note (Signed)
Addended by: Edd Arbour on: 04/25/2020 03:29 PM   Modules accepted: Orders

## 2020-05-02 ENCOUNTER — Other Ambulatory Visit: Payer: Medicaid Other

## 2020-05-06 ENCOUNTER — Other Ambulatory Visit: Payer: Medicaid Other

## 2020-05-06 DIAGNOSIS — D508 Other iron deficiency anemias: Secondary | ICD-10-CM

## 2020-05-07 LAB — CBC
Hematocrit: 31.4 % — ABNORMAL LOW (ref 34.0–46.6)
Hemoglobin: 9.9 g/dL — ABNORMAL LOW (ref 11.1–15.9)
MCH: 27.7 pg (ref 26.6–33.0)
MCHC: 31.5 g/dL (ref 31.5–35.7)
MCV: 88 fL (ref 79–97)
Platelets: 111 10*3/uL — ABNORMAL LOW (ref 150–450)
RBC: 3.57 x10E6/uL — ABNORMAL LOW (ref 3.77–5.28)
RDW: 15 % (ref 11.7–15.4)
WBC: 9.8 10*3/uL (ref 3.4–10.8)

## 2020-05-09 ENCOUNTER — Ambulatory Visit (INDEPENDENT_AMBULATORY_CARE_PROVIDER_SITE_OTHER): Payer: Medicaid Other | Admitting: Medical

## 2020-05-09 ENCOUNTER — Encounter: Payer: Self-pay | Admitting: General Practice

## 2020-05-09 ENCOUNTER — Encounter: Payer: Self-pay | Admitting: Medical

## 2020-05-09 VITALS — BP 114/68 | HR 93 | Wt 162.0 lb

## 2020-05-09 DIAGNOSIS — Z3A35 35 weeks gestation of pregnancy: Secondary | ICD-10-CM

## 2020-05-09 DIAGNOSIS — O99013 Anemia complicating pregnancy, third trimester: Secondary | ICD-10-CM

## 2020-05-09 DIAGNOSIS — Z3493 Encounter for supervision of normal pregnancy, unspecified, third trimester: Secondary | ICD-10-CM

## 2020-05-09 DIAGNOSIS — D696 Thrombocytopenia, unspecified: Secondary | ICD-10-CM

## 2020-05-09 DIAGNOSIS — Z3A34 34 weeks gestation of pregnancy: Secondary | ICD-10-CM

## 2020-05-09 DIAGNOSIS — R8271 Bacteriuria: Secondary | ICD-10-CM

## 2020-05-09 DIAGNOSIS — Z8759 Personal history of other complications of pregnancy, childbirth and the puerperium: Secondary | ICD-10-CM

## 2020-05-09 MED ORDER — FERROUS SULFATE IRON 200 (65 FE) MG PO TABS
1.0000 | ORAL_TABLET | ORAL | 2 refills | Status: DC
Start: 2020-05-09 — End: 2020-06-13
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
## 2020-05-09 NOTE — Patient Instructions (Signed)
Fetal Movement Counts Patient Name: ________________________________________________ Patient Due Date: ____________________  What is a fetal movement count? A fetal movement count is the number of times that you feel your baby move during a certain amount of time. This may also be called a fetal kick count. A fetal movement count is recommended for every pregnant woman. You may be asked to start counting fetal movements as early as week 28 of your pregnancy. Pay attention to when your baby is most active. You may notice your baby's sleep and wake cycles. You may also notice things that make your baby move more. You should do a fetal movement count:  When your baby is normally most active.  At the same time each day. A good time to count movements is while you are resting, after having something to eat and drink. How do I count fetal movements? 1. Find a quiet, comfortable area. Sit, or lie down on your side. 2. Write down the date, the start time and stop time, and the number of movements that you felt between those two times. Take this information with you to your health care visits. 3. Write down your start time when you feel the first movement. 4. Count kicks, flutters, swishes, rolls, and jabs. You should feel at least 10 movements. 5. You may stop counting after you have felt 10 movements, or if you have been counting for 2 hours. Write down the stop time. 6. If you do not feel 10 movements in 2 hours, contact your health care provider for further instructions. Your health care provider may want to do additional tests to assess your baby's well-being. Contact a health care provider if:  You feel fewer than 10 movements in 2 hours.  Your baby is not moving like he or she usually does. Date: ____________ Start time: ____________ Stop time: ____________ Movements: ____________ Date: ____________ Start time: ____________ Stop time: ____________ Movements: ____________ Date: ____________  Start time: ____________ Stop time: ____________ Movements: ____________ Date: ____________ Start time: ____________ Stop time: ____________ Movements: ____________ Date: ____________ Start time: ____________ Stop time: ____________ Movements: ____________ Date: ____________ Start time: ____________ Stop time: ____________ Movements: ____________ Date: ____________ Start time: ____________ Stop time: ____________ Movements: ____________ Date: ____________ Start time: ____________ Stop time: ____________ Movements: ____________ Date: ____________ Start time: ____________ Stop time: ____________ Movements: ____________ This information is not intended to replace advice given to you by your health care provider. Make sure you discuss any questions you have with your health care provider. Document Revised: 08/18/2018 Document Reviewed: 08/18/2018 Elsevier Patient Education  2021 Elsevier Inc. Rosen's Emergency Medicine: Concepts and Clinical Practice (9th ed., pp. 2296- 2312). Elsevier.">    Braxton Hicks Contractions Contractions of the uterus can occur throughout pregnancy, but they are not always a sign that you are in labor. You may have practice contractions called Braxton Hicks contractions. These false labor contractions are sometimes confused with true labor. What are Braxton Hicks contractions? Braxton Hicks contractions are tightening movements that occur in the muscles of the uterus before labor. Unlike true labor contractions, these contractions do not result in opening (dilation) and thinning of the cervix. Toward the end of pregnancy (32-34 weeks), Braxton Hicks contractions can happen more often and may become stronger. These contractions are sometimes difficult to tell apart from true labor because they can be very uncomfortable. You should not feel embarrassed if you go to the hospital with false labor. Sometimes, the only way to tell if you are in true labor is   for your health care  provider to look for changes in the cervix. The health care provider will do a physical exam and may monitor your contractions. If you are not in true labor, the exam should show that your cervix is not dilating and your water has not broken. If there are no other health problems associated with your pregnancy, it is completely safe for you to be sent home with false labor. You may continue to have Braxton Hicks contractions until you go into true labor. How to tell the difference between true labor and false labor True labor  Contractions last 30-70 seconds.  Contractions become very regular.  Discomfort is usually felt in the top of the uterus, and it spreads to the lower abdomen and low back.  Contractions do not go away with walking.  Contractions usually become more intense and increase in frequency.  The cervix dilates and gets thinner. False labor  Contractions are usually shorter and not as strong as true labor contractions.  Contractions are usually irregular.  Contractions are often felt in the front of the lower abdomen and in the groin.  Contractions may go away when you walk around or change positions while lying down.  Contractions get weaker and are shorter-lasting as time goes on.  The cervix usually does not dilate or become thin. Follow these instructions at home:  Take over-the-counter and prescription medicines only as told by your health care provider.  Keep up with your usual exercises and follow other instructions from your health care provider.  Eat and drink lightly if you think you are going into labor.  If Braxton Hicks contractions are making you uncomfortable: ? Change your position from lying down or resting to walking, or change from walking to resting. ? Sit and rest in a tub of warm water. ? Drink enough fluid to keep your urine pale yellow. Dehydration may cause these contractions. ? Do slow and deep breathing several times an hour.  Keep  all follow-up prenatal visits as told by your health care provider. This is important.   Contact a health care provider if:  You have a fever.  You have continuous pain in your abdomen. Get help right away if:  Your contractions become stronger, more regular, and closer together.  You have fluid leaking or gushing from your vagina.  You pass blood-tinged mucus (bloody show).  You have bleeding from your vagina.  You have low back pain that you never had before.  You feel your baby's head pushing down and causing pelvic pressure.  Your baby is not moving inside you as much as it used to. Summary  Contractions that occur before labor are called Braxton Hicks contractions, false labor, or practice contractions.  Braxton Hicks contractions are usually shorter, weaker, farther apart, and less regular than true labor contractions. True labor contractions usually become progressively stronger and regular, and they become more frequent.  Manage discomfort from Braxton Hicks contractions by changing position, resting in a warm bath, drinking plenty of water, or practicing deep breathing. This information is not intended to replace advice given to you by your health care provider. Make sure you discuss any questions you have with your health care provider. Document Revised: 12/11/2016 Document Reviewed: 05/14/2016 Elsevier Patient Education  2021 Elsevier Inc.  

## 2020-05-20 ENCOUNTER — Other Ambulatory Visit: Payer: Self-pay

## 2020-05-20 ENCOUNTER — Ambulatory Visit: Payer: Medicaid Other | Admitting: *Deleted

## 2020-05-20 ENCOUNTER — Encounter: Payer: Self-pay | Admitting: *Deleted

## 2020-05-20 ENCOUNTER — Ambulatory Visit: Payer: Medicaid Other | Attending: Medical

## 2020-05-20 DIAGNOSIS — R8271 Bacteriuria: Secondary | ICD-10-CM | POA: Diagnosis present

## 2020-05-20 DIAGNOSIS — Z3A35 35 weeks gestation of pregnancy: Secondary | ICD-10-CM | POA: Insufficient documentation

## 2020-05-20 DIAGNOSIS — Z8759 Personal history of other complications of pregnancy, childbirth and the puerperium: Secondary | ICD-10-CM | POA: Insufficient documentation

## 2020-05-22 ENCOUNTER — Ambulatory Visit (INDEPENDENT_AMBULATORY_CARE_PROVIDER_SITE_OTHER): Payer: Medicaid Other | Admitting: Certified Nurse Midwife

## 2020-05-22 ENCOUNTER — Other Ambulatory Visit: Payer: Self-pay

## 2020-05-22 VITALS — BP 102/68 | HR 87 | Wt 166.2 lb

## 2020-05-22 DIAGNOSIS — D509 Iron deficiency anemia, unspecified: Secondary | ICD-10-CM

## 2020-05-22 DIAGNOSIS — O99113 Other diseases of the blood and blood-forming organs and certain disorders involving the immune mechanism complicating pregnancy, third trimester: Secondary | ICD-10-CM

## 2020-05-22 DIAGNOSIS — Z3A36 36 weeks gestation of pregnancy: Secondary | ICD-10-CM

## 2020-05-22 DIAGNOSIS — O99019 Anemia complicating pregnancy, unspecified trimester: Secondary | ICD-10-CM

## 2020-05-22 DIAGNOSIS — Z3403 Encounter for supervision of normal first pregnancy, third trimester: Secondary | ICD-10-CM

## 2020-05-22 DIAGNOSIS — D696 Thrombocytopenia, unspecified: Secondary | ICD-10-CM

## 2020-05-22 NOTE — Progress Notes (Signed)
Daily pressure on pelvis
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
## 2020-05-22 NOTE — Patient Instructions (Signed)

## 2020-05-23 ENCOUNTER — Encounter: Payer: Self-pay | Admitting: *Deleted

## 2020-05-23 LAB — CBC
Hematocrit: 30 % — ABNORMAL LOW (ref 34.0–46.6)
Hemoglobin: 10 g/dL — ABNORMAL LOW (ref 11.1–15.9)
MCH: 28.7 pg (ref 26.6–33.0)
MCHC: 33.3 g/dL (ref 31.5–35.7)
MCV: 86 fL (ref 79–97)
Platelets: 78 10*3/uL — CL (ref 150–450)
RBC: 3.49 x10E6/uL — ABNORMAL LOW (ref 3.77–5.28)
RDW: 15.4 % (ref 11.7–15.4)
WBC: 8.4 10*3/uL (ref 3.4–10.8)

## 2020-05-23 MED ORDER — PREDNISONE 5 MG PO TABS: 10.0000 mg | ORAL_TABLET | Freq: Every day | ORAL | 0 refills | Status: AC

## 2020-05-23 NOTE — Addendum Note (Signed)
Addended by: Edd Arbour on: 05/23/2020 08:34 AM   Modules accepted: Orders

## 2020-05-28 ENCOUNTER — Ambulatory Visit (INDEPENDENT_AMBULATORY_CARE_PROVIDER_SITE_OTHER): Payer: Medicaid Other | Admitting: Student

## 2020-05-28 ENCOUNTER — Other Ambulatory Visit (HOSPITAL_COMMUNITY)
Admission: RE | Admit: 2020-05-28 | Discharge: 2020-05-28 | Disposition: A | Discharge status: Home / Self Care | Payer: Medicaid Other | Source: Ambulatory Visit | Attending: Student | Admitting: Student

## 2020-05-28 ENCOUNTER — Other Ambulatory Visit: Payer: Self-pay

## 2020-05-28 VITALS — BP 107/66 | HR 89 | Wt 164.7 lb

## 2020-05-28 DIAGNOSIS — O99013 Anemia complicating pregnancy, third trimester: Secondary | ICD-10-CM

## 2020-05-28 DIAGNOSIS — O99113 Other diseases of the blood and blood-forming organs and certain disorders involving the immune mechanism complicating pregnancy, third trimester: Secondary | ICD-10-CM | POA: Insufficient documentation

## 2020-05-28 DIAGNOSIS — Z3A36 36 weeks gestation of pregnancy: Secondary | ICD-10-CM

## 2020-05-28 DIAGNOSIS — D696 Thrombocytopenia, unspecified: Secondary | ICD-10-CM

## 2020-05-28 DIAGNOSIS — Z3493 Encounter for supervision of normal pregnancy, unspecified, third trimester: Secondary | ICD-10-CM | POA: Diagnosis present

## 2020-05-28 NOTE — Patient Instructions (Signed)
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
## 2020-05-28 NOTE — Progress Notes (Signed)
Patient states that she is having the "same" pelvic pressure but denies any pain or vaginal bleeding and states that baby moves daily.

## 2020-05-29 LAB — CERVICOVAGINAL ANCILLARY ONLY
Chlamydia: NEGATIVE
Comment: NEGATIVE
Comment: NORMAL
Neisseria Gonorrhea: NEGATIVE

## 2020-06-04 ENCOUNTER — Other Ambulatory Visit: Payer: Self-pay

## 2020-06-04 ENCOUNTER — Ambulatory Visit (INDEPENDENT_AMBULATORY_CARE_PROVIDER_SITE_OTHER): Payer: Medicaid Other | Admitting: Obstetrics and Gynecology

## 2020-06-04 VITALS — BP 112/77 | HR 86 | Wt 167.7 lb

## 2020-06-04 DIAGNOSIS — D696 Thrombocytopenia, unspecified: Secondary | ICD-10-CM

## 2020-06-04 DIAGNOSIS — Z3493 Encounter for supervision of normal pregnancy, unspecified, third trimester: Secondary | ICD-10-CM

## 2020-06-04 DIAGNOSIS — O99013 Anemia complicating pregnancy, third trimester: Secondary | ICD-10-CM

## 2020-06-04 DIAGNOSIS — Z3A38 38 weeks gestation of pregnancy: Secondary | ICD-10-CM
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
## 2020-06-05 LAB — CBC
Hematocrit: 35.6 % (ref 34.0–46.6)
Hemoglobin: 11.3 g/dL (ref 11.1–15.9)
MCH: 27.2 pg (ref 26.6–33.0)
MCHC: 31.7 g/dL (ref 31.5–35.7)
MCV: 86 fL (ref 79–97)
Platelets: 82 10*3/uL — CL (ref 150–450)
RBC: 4.15 x10E6/uL (ref 3.77–5.28)
RDW: 16.4 % — ABNORMAL HIGH (ref 11.7–15.4)
WBC: 10.7 10*3/uL (ref 3.4–10.8)

## 2020-06-10 ENCOUNTER — Encounter (HOSPITAL_COMMUNITY): Payer: Self-pay | Admitting: Obstetrics and Gynecology

## 2020-06-10 ENCOUNTER — Inpatient Hospital Stay (EMERGENCY_DEPARTMENT_HOSPITAL)
Admission: AD | Admit: 2020-06-10 | Discharge: 2020-06-10 | Disposition: A | Discharge status: Home / Self Care | Payer: Medicaid Other | Source: Home / Self Care | Attending: Obstetrics and Gynecology | Admitting: Obstetrics and Gynecology

## 2020-06-10 DIAGNOSIS — Z3A39 39 weeks gestation of pregnancy: Secondary | ICD-10-CM | POA: Diagnosis not present

## 2020-06-10 DIAGNOSIS — O471 False labor at or after 37 completed weeks of gestation: Secondary | ICD-10-CM

## 2020-06-10 DIAGNOSIS — O479 False labor, unspecified: Secondary | ICD-10-CM

## 2020-06-10 NOTE — Discharge Instructions (Signed)
[employer]'s Emergency Medicine: Concepts and Clinical Practice (9th ed., pp. 2296- 2312). Elsevier.">  Braxton Hicks Contractions Contractions of the uterus can occur throughout pregnancy, but they are not always a sign that you are in labor. You may have practice contractions called Braxton Hicks contractions. These false labor contractions are sometimes confused with true labor. What are Braxton Hicks contractions? Braxton Hicks contractions are tightening movements that occur in the muscles of the uterus before labor. Unlike true labor contractions, these contractions do not result in opening (dilation) and thinning of the cervix. Toward the end of pregnancy (32-34 weeks), Braxton Hicks contractions can happen more often and may become stronger. These contractions are sometimes difficult to tell apart from true labor because they can be very uncomfortable. You should not feel embarrassed if you go to the hospital with false labor. Sometimes, the only way to tell if you are in true labor is for your health care provider to look for changes in the cervix. The health care provider will do a physical exam and may monitor your contractions. If you are not in true labor, the exam should show that your cervix is not dilating and your water has not broken. If there are no other health problems associated with your pregnancy, it is completely safe for you to be sent home with false labor. You may continue to have Braxton Hicks contractions until you go into true labor. How to tell the difference between true labor and false labor True labor  Contractions last 30-70 seconds.  Contractions become very regular.  Discomfort is usually felt in the top of the uterus, and it spreads to the lower abdomen and low back.  Contractions do not go away with walking.  Contractions usually become more intense and increase in frequency.  The cervix dilates and gets thinner. False labor  Contractions are usually shorter  and not as strong as true labor contractions.  Contractions are usually irregular.  Contractions are often felt in the front of the lower abdomen and in the groin.  Contractions may go away when you walk around or change positions while lying down.  Contractions get weaker and are shorter-lasting as time goes on.  The cervix usually does not dilate or become thin. Follow these instructions at home:  Take over-the-counter and prescription medicines only as told by your health care provider.  Keep up with your usual exercises and follow other instructions from your health care provider.  Eat and drink lightly if you think you are going into labor.  If Braxton Hicks contractions are making you uncomfortable: ? Change your position from lying down or resting to walking, or change from walking to resting. ? Sit and rest in a tub of warm water. ? Drink enough fluid to keep your urine pale yellow. Dehydration may cause these contractions. ? Do slow and deep breathing several times an hour.  Keep all follow-up prenatal visits as told by your health care provider. This is important.   Contact a health care provider if:  You have a fever.  You have continuous pain in your abdomen. Get help right away if:  Your contractions become stronger, more regular, and closer together.  You have fluid leaking or gushing from your vagina.  You pass blood-tinged mucus (bloody show).  You have bleeding from your vagina.  You have low back pain that you never had before.  You feel your baby's head pushing down and causing pelvic pressure.  Your baby is not moving inside   you as much as it used to. Summary  Contractions that occur before labor are called Braxton Hicks contractions, false labor, or practice contractions.  Braxton Hicks contractions are usually shorter, weaker, farther apart, and less regular than true labor contractions. True labor contractions usually become progressively  stronger and regular, and they become more frequent.  Manage discomfort from Braxton Hicks contractions by changing position, resting in a warm bath, drinking plenty of water, or practicing deep breathing. This information is not intended to replace advice given to you by your health care provider. Make sure you discuss any questions you have with your health care provider. Document Revised: 12/11/2016 Document Reviewed: 05/14/2016 Elsevier Patient Education  2021 [employer] Inc.   Fetal Movement Counts Patient Name: ________________________________________________ Patient Due Date: ____________________  What is a fetal movement count? A fetal movement count is the number of times that you feel your baby move during a certain amount of time. This may also be called a fetal kick count. A fetal movement count is recommended for every pregnant woman. You may be asked to start counting fetal movements as early as week 28 of your pregnancy. Pay attention to when your baby is most active. You may notice your baby's sleep and wake cycles. You may also notice things that make your baby move more. You should do a fetal movement count:  When your baby is normally most active.  At the same time each day. A good time to count movements is while you are resting, after having something to eat and drink. How do I count fetal movements? 1. Find a quiet, comfortable area. Sit, or lie down on your side. 2. Write down the date, the start time and stop time, and the number of movements that you felt between those two times. Take this information with you to your health care visits. 3. Write down your start time when you feel the first movement. 4. Count kicks, flutters, swishes, rolls, and jabs. You should feel at least 10 movements. 5. You may stop counting after you have felt 10 movements, or if you have been counting for 2 hours. Write down the stop time. 6. If you do not feel 10 movements in 2 hours, contact  your health care provider for further instructions. Your health care provider may want to do additional tests to assess your baby's well-being. Contact a health care provider if:  You feel fewer than 10 movements in 2 hours.  Your baby is not moving like he or she usually does. Date: ____________ Start time: ____________ Stop time: ____________ Movements: ____________ Date: ____________ Start time: ____________ Stop time: ____________ Movements: ____________ Date: ____________ Start time: ____________ Stop time: ____________ Movements: ____________ Date: ____________ Start time: ____________ Stop time: ____________ Movements: ____________ Date: ____________ Start time: ____________ Stop time: ____________ Movements: ____________ Date: ____________ Start time: ____________ Stop time: ____________ Movements: ____________ Date: ____________ Start time: ____________ Stop time: ____________ Movements: ____________ Date: ____________ Start time: ____________ Stop time: ____________ Movements: ____________ Date: ____________ Start time: ____________ Stop time: ____________ Movements: ____________ This information is not intended to replace advice given to you by your health care provider. Make sure you discuss any questions you have with your health care provider. Document Revised: 08/18/2018 Document Reviewed: 08/18/2018 Elsevier Patient Education  2021 Elsevier Inc.  

## 2020-06-10 NOTE — MAU Provider Note (Signed)
S: [name] is a 35 y.o. G2P1001 at [redacted]w[redacted]d  who presents to MAU today for labor evaluation.     Cervical exam by RN:  Dilation: 4.5 Effacement (%): 70 Station: -2,-1 Presentation: Vertex Exam by:: Karl Ito, rnc  Fetal Monitoring: Baseline: 125 Variability: moderate Accelerations: multiple Decelerations: none Contractions: irregular  MDM Discussed patient with RN. NST reviewed and reactive.  A: SIUP at [redacted]w[redacted]d  False labor  P: Discharge home Labor precautions and kick counts included in AVS Patient to follow-up with Dr. Myriam Jacobson on 06/11/20 as scheduled  Patient may return to MAU as needed or when in labor   Dalante Minus, Skipper Cliche, MD OB Fellow, [facility] 06/10/2020 10:42 PM

## 2020-06-10 NOTE — Progress Notes (Signed)
I have communicated with Dr. Barb Merino and reviewed vital signs: There were no vitals filed for this visit.  Vaginal exam:  Dilation: 4.5 Effacement (%): 70 Station: -2,-1 Presentation: Vertex Exam by:: Karl Ito, rnc,   Also reviewed contraction pattern and that non-stress test is reactive.  It has been documented that patient is contracting every 7-13 minutes with minimal cervical change over 1 hours not indicating active labor.  Patient denies any other complaints.  Based on this report provider has given order for discharge.  A discharge order and diagnosis entered by a provider.   Labor discharge instructions reviewed with patient.

## 2020-06-10 NOTE — MAU Note (Signed)
Contractions since yesterday, more intense and frequent since 11 am, denies leaking of fluid or bleeding

## 2020-06-11 ENCOUNTER — Ambulatory Visit (INDEPENDENT_AMBULATORY_CARE_PROVIDER_SITE_OTHER): Payer: Medicaid Other | Admitting: Obstetrics and Gynecology

## 2020-06-11 ENCOUNTER — Other Ambulatory Visit: Payer: Self-pay

## 2020-06-11 ENCOUNTER — Encounter (HOSPITAL_COMMUNITY): Payer: Self-pay | Admitting: Family Medicine

## 2020-06-11 ENCOUNTER — Inpatient Hospital Stay (HOSPITAL_COMMUNITY)
Admission: AD | Admit: 2020-06-11 | Discharge: 2020-06-13 | DRG: 807 | Disposition: A | Discharge status: Home / Self Care | Payer: Medicaid Other | Attending: Family Medicine | Admitting: Family Medicine

## 2020-06-11 VITALS — BP 141/83 | HR 80 | Wt 166.1 lb

## 2020-06-11 DIAGNOSIS — Z23 Encounter for immunization: Secondary | ICD-10-CM | POA: Diagnosis not present

## 2020-06-11 DIAGNOSIS — Z7982 Long term (current) use of aspirin: Secondary | ICD-10-CM | POA: Diagnosis not present

## 2020-06-11 DIAGNOSIS — Z3A39 39 weeks gestation of pregnancy: Secondary | ICD-10-CM

## 2020-06-11 DIAGNOSIS — Z87891 Personal history of nicotine dependence: Secondary | ICD-10-CM

## 2020-06-11 DIAGNOSIS — O9902 Anemia complicating childbirth: Secondary | ICD-10-CM | POA: Diagnosis present

## 2020-06-11 DIAGNOSIS — O4202 Full-term premature rupture of membranes, onset of labor within 24 hours of rupture: Secondary | ICD-10-CM | POA: Diagnosis not present

## 2020-06-11 DIAGNOSIS — O9912 Other diseases of the blood and blood-forming organs and certain disorders involving the immune mechanism complicating childbirth: Secondary | ICD-10-CM | POA: Diagnosis present

## 2020-06-11 DIAGNOSIS — D6959 Other secondary thrombocytopenia: Secondary | ICD-10-CM | POA: Diagnosis present

## 2020-06-11 DIAGNOSIS — O26893 Other specified pregnancy related conditions, third trimester: Secondary | ICD-10-CM | POA: Diagnosis present

## 2020-06-11 DIAGNOSIS — Z3493 Encounter for supervision of normal pregnancy, unspecified, third trimester: Secondary | ICD-10-CM

## 2020-06-11 DIAGNOSIS — R8271 Bacteriuria: Secondary | ICD-10-CM

## 2020-06-11 DIAGNOSIS — O99824 Streptococcus B carrier state complicating childbirth: Secondary | ICD-10-CM | POA: Diagnosis present

## 2020-06-11 DIAGNOSIS — O358XX0 Maternal care for other (suspected) fetal abnormality and damage, not applicable or unspecified: Secondary | ICD-10-CM | POA: Diagnosis present

## 2020-06-11 DIAGNOSIS — O99013 Anemia complicating pregnancy, third trimester: Secondary | ICD-10-CM

## 2020-06-11 DIAGNOSIS — D696 Thrombocytopenia, unspecified: Secondary | ICD-10-CM

## 2020-06-11 LAB — CBC
HCT: 38.6 % (ref 36.0–46.0)
Hemoglobin: 12 g/dL (ref 12.0–15.0)
MCH: 28 pg (ref 26.0–34.0)
MCHC: 31.1 g/dL (ref 30.0–36.0)
MCV: 90 fL (ref 80.0–100.0)
Platelets: 91 10*3/uL — ABNORMAL LOW (ref 150–400)
RBC: 4.29 MIL/uL (ref 3.87–5.11)
RDW: 18.5 % — ABNORMAL HIGH (ref 11.5–15.5)
WBC: 9.9 10*3/uL (ref 4.0–10.5)
nRBC: 0 % (ref 0.0–0.2)

## 2020-06-11 LAB — TYPE AND SCREEN
ABO/RH(D): A POS
Antibody Screen: NEGATIVE

## 2020-06-11 MED ORDER — DIPHENHYDRAMINE HCL 25 MG PO CAPS: 25.0000 mg | ORAL_CAPSULE | Freq: Four times a day (QID) | ORAL | Status: DC | PRN

## 2020-06-11 MED ORDER — TETANUS-DIPHTH-ACELL PERTUSSIS 5-2.5-18.5 LF-MCG/0.5 IM SUSY
0.5000 mL | PREFILLED_SYRINGE | Freq: Once | INTRAMUSCULAR | Status: AC
Start: 2020-06-12 — End: 2020-06-12
  Administered 2020-06-12: 0.5 mL via INTRAMUSCULAR
  Filled 2020-06-11: qty 0.5

## 2020-06-11 MED ORDER — OXYCODONE-ACETAMINOPHEN 5-325 MG PO TABS: 2.0000 | ORAL_TABLET | ORAL | Status: DC | PRN

## 2020-06-11 MED ORDER — ZOLPIDEM TARTRATE 5 MG PO TABS: 5.0000 mg | ORAL_TABLET | Freq: Every evening | ORAL | Status: DC | PRN

## 2020-06-11 MED ORDER — ONDANSETRON HCL 4 MG/2ML IJ SOLN
4.0000 mg | Freq: Four times a day (QID) | INTRAMUSCULAR | Status: DC | PRN
  Administered 2020-06-11: 4 mg via INTRAVENOUS
  Filled 2020-06-11: qty 2

## 2020-06-11 MED ORDER — OXYTOCIN BOLUS FROM INFUSION
333.0000 mL | Freq: Once | INTRAVENOUS | Status: DC
  Administered 2020-06-11: 333 mL via INTRAVENOUS

## 2020-06-11 MED ORDER — MEASLES, MUMPS & RUBELLA VAC IJ SOLR: 0.5000 mL | Freq: Once | INTRAMUSCULAR | Status: DC

## 2020-06-11 MED ORDER — COCONUT OIL OIL: 1.0000 "application " | TOPICAL_OIL | Status: DC | PRN

## 2020-06-11 MED ORDER — ACETAMINOPHEN 325 MG PO TABS: 650.0000 mg | ORAL_TABLET | ORAL | Status: DC | PRN

## 2020-06-11 MED ORDER — FENTANYL CITRATE (PF) 100 MCG/2ML IJ SOLN: 100.0000 ug | INTRAMUSCULAR | Status: DC | PRN

## 2020-06-11 MED ORDER — SODIUM CHLORIDE 0.9 % IV SOLN: 1.0000 g | INTRAVENOUS | Status: DC

## 2020-06-11 MED ORDER — LACTATED RINGERS IV SOLN: INTRAVENOUS | Status: DC

## 2020-06-11 MED ORDER — WITCH HAZEL-GLYCERIN EX PADS: 1.0000 "application " | MEDICATED_PAD | CUTANEOUS | Status: DC | PRN

## 2020-06-11 MED ORDER — LIDOCAINE HCL (PF) 1 % IJ SOLN: 30.0000 mL | INTRAMUSCULAR | Status: DC | PRN

## 2020-06-11 MED ORDER — SOD CITRATE-CITRIC ACID 500-334 MG/5ML PO SOLN: 30.0000 mL | ORAL | Status: DC | PRN

## 2020-06-11 MED ORDER — FLEET ENEMA 7-19 GM/118ML RE ENEM: 1.0000 | ENEMA | RECTAL | Status: DC | PRN

## 2020-06-11 MED ORDER — ONDANSETRON HCL 4 MG/2ML IJ SOLN: 4.0000 mg | INTRAMUSCULAR | Status: DC | PRN

## 2020-06-11 MED ORDER — OXYTOCIN-SODIUM CHLORIDE 30-0.9 UT/500ML-% IV SOLN
2.5000 [IU]/h | INTRAVENOUS | Status: DC
  Filled 2020-06-11: qty 500

## 2020-06-11 MED ORDER — PRENATAL MULTIVITAMIN CH
1.0000 | ORAL_TABLET | Freq: Every day | ORAL | Status: DC
Start: 2020-06-12 — End: 2020-06-13
  Administered 2020-06-12 – 2020-06-13 (×2): 1 via ORAL
  Filled 2020-06-11 (×2): qty 1

## 2020-06-11 MED ORDER — SODIUM CHLORIDE 0.9 % IV SOLN
2.0000 g | Freq: Once | INTRAVENOUS | Status: DC
  Filled 2020-06-11: qty 2000

## 2020-06-11 MED ORDER — SIMETHICONE 80 MG PO CHEW: 80.0000 mg | CHEWABLE_TABLET | ORAL | Status: DC | PRN

## 2020-06-11 MED ORDER — DIBUCAINE (PERIANAL) 1 % EX OINT: 1.0000 "application " | TOPICAL_OINTMENT | CUTANEOUS | Status: DC | PRN

## 2020-06-11 MED ORDER — OXYCODONE-ACETAMINOPHEN 5-325 MG PO TABS: 1.0000 | ORAL_TABLET | ORAL | Status: DC | PRN

## 2020-06-11 MED ORDER — IBUPROFEN 600 MG PO TABS
600.0000 mg | ORAL_TABLET | Freq: Four times a day (QID) | ORAL | Status: DC
  Administered 2020-06-11 – 2020-06-13 (×8): 600 mg via ORAL
  Filled 2020-06-11 (×8): qty 1

## 2020-06-11 MED ORDER — SENNOSIDES-DOCUSATE SODIUM 8.6-50 MG PO TABS
2.0000 | ORAL_TABLET | ORAL | Status: DC
  Administered 2020-06-11 – 2020-06-12 (×2): 2 via ORAL
  Filled 2020-06-11 (×2): qty 2

## 2020-06-11 MED ORDER — LACTATED RINGERS IV SOLN: 500.0000 mL | INTRAVENOUS | Status: DC | PRN

## 2020-06-11 MED ORDER — ONDANSETRON HCL 4 MG PO TABS: 4.0000 mg | ORAL_TABLET | ORAL | Status: DC | PRN

## 2020-06-11 MED ORDER — BENZOCAINE-MENTHOL 20-0.5 % EX AERO: 1.0000 "application " | INHALATION_SPRAY | CUTANEOUS | Status: DC | PRN

## 2020-06-11 NOTE — Lactation Note (Signed)
This note was copied from a baby's chart. Lactation Consultation Note  Patient Name: [name] [name] [id number] Date: 06/11/2020 Reason for consult: Follow-up assessment;Term Age:33 hours Mom ambulating around room, MGM sitting on couch holding sleeping baby. Mom reports baby is latching and nursing well, states feedings are ~63mins. Denies pain to breasts or nipple damage. Mom reports h/o first child in NICU x3days, was unable to latch once home, mom pumped and fed EBM via bottle x59mo (child now 8yo). Mom has WIC ([place] plans to inquire about a DEBP. Mom without concerns at this time.   LC taught hand expression via teach back method, mom easily expressed large drops of colostrum. Discussed cue based feedings, expect 8-12 feedings a day, hand express after feeds and offer back to baby, continuous skin to skin while awake and alert, avoid pumping/ bottles/ pacifiers x24mo unless indicated, engorgement and how to manage. Encouraged mom to f/u with WIC as needed for feeding support.  Mom voiced understanding and with no further concerns. Left the room with baby still asleep in MGM's arms. BGilliam, RN, IBCLC  Maternal Data Has patient been taught Hand Expression?: Yes Does the patient have breastfeeding experience prior to this delivery?: Yes How long did the patient breastfeed?: pumped x42mo  Feeding Mother's Current Feeding Choice: Breast Milk    Interventions Interventions: Breast feeding basics reviewed;Hand express  Discharge WIC Program: Yes  Consult Status Consult Status: Follow-up Date: 06/12/20 Follow-up type: In-patient    Charlynn Court 06/11/2020, 9:07 PM
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
## 2020-06-11 NOTE — Lactation Note (Signed)
Lactation Consultation Note LC to room for initial visit in L&D. Mother was bf'ing during visit. LC observed rhythmic suckling. Mom denied pain. Will plan f/u visit on MBU. Patient Name: [name] [name] [id number] Date: 06/11/2020 Reason for consult: L&D Initial assessment Age:35 y.o.  Maternal Data Does the patient have breastfeeding experience prior to this delivery?: Yes How long did the patient breastfeed?: pumped for 8 months No hx breast surgery/trauma Normal breast symmetry   Feeding Mother's Current Feeding Choice: Breast Milk   Interventions Interventions: Education;Support pillows;Breast feeding basics reviewed  Discharge WIC Program: Yes  Consult Status Consult Status: Follow-up Follow-up type: In-patient   Elder Negus, MA IBCLC 06/11/2020, 3:56 PM
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
## 2020-06-12 ENCOUNTER — Telehealth: Payer: Self-pay | Admitting: Obstetrics and Gynecology

## 2020-06-12 LAB — RPR: RPR Ser Ql: NONREACTIVE

## 2020-06-12 MED ORDER — CALCIUM CARBONATE ANTACID 500 MG PO CHEW
1.0000 | CHEWABLE_TABLET | Freq: Three times a day (TID) | ORAL | Status: DC | PRN
  Administered 2020-06-12: 200 mg via ORAL
  Filled 2020-06-12: qty 1

## 2020-06-12 MED ORDER — MEDROXYPROGESTERONE ACETATE 150 MG/ML IM SUSP
150.0000 mg | Freq: Once | INTRAMUSCULAR | Status: AC
  Administered 2020-06-13: 150 mg via INTRAMUSCULAR
  Filled 2020-06-12: qty 1
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
## 2020-06-12 NOTE — Progress Notes (Signed)
POSTPARTUM PROGRESS NOTE  Post Partum Day 1  Subjective:  [name] [name] is a 34 y.o. [id number] s/p SVD at [redacted]w[redacted]d.  She reports she is doing well. No acute events overnight. She denies any problems with ambulating, voiding or po intake. Denies nausea or vomiting. No BM, but passing lots of gas. Pain is well controlled.  Lochia is minimal.  Objective: Blood pressure 120/63, pulse 79, temperature 98.1 F (36.7 C), temperature source Oral, resp. rate 16, height 5\' 2"  (1.575 m), weight 75.3 kg, last menstrual period 09/08/2019, SpO2 100 %, unknown if currently breastfeeding.  Physical Exam:  General: alert, cooperative and no distress Chest: no respiratory distress Heart:regular rate, distal pulses intact Abdomen: soft, nontender Uterine Fundus: firm, appropriately tender DVT Evaluation: No calf swelling or tenderness Extremities: no edema Skin: warm, dry  Recent Labs    06/11/20 1430  HGB 12.0  HCT 38.6    Assessment/Plan: [name] [name] is a 35 y.o. 34 s/p SVD at [redacted]w[redacted]d   PPD#1 - Doing well. Continue routine postpartum care.  Gestational thrombocytopenia - Plt 91 on 5/31 after delivery. Improved from 78 three weeks ago. Minimal blood loss after delivery, EBL 60cc. Continue to monitor Contraception: depo before discharge, interval BTL Feeding: breast Dispo: Plan for discharge likely 6/2 given inadequate GBS ppx.   LOS: 1 day    8/2 MD, PGY-1 OBGYN Faculty Teaching Service  06/12/2020, 12:01 AM

## 2020-06-12 NOTE — Telephone Encounter (Signed)
   AUDREE SCHRECENGOST DOB: [birth year] MRN: [id number]   RIDER WAIVER AND RELEASE OF LIABILITY  For purposes of improving physical access to our facilities, [facility] is pleased to partner with third parties to provide [facility] patients or other authorized individuals the option of convenient, on-demand ground transportation services (the [employer]") through use of the technology service that enables users to request on-demand ground transportation from independent third-party providers.  By opting to use and accept these [employer], I, the undersigned, hereby agree on behalf of myself, and on behalf of any minor child using the [employer] for whom I am the parent or legal guardian, as follows:  1. Science writer provided to me are provided by independent third-party transportation providers who are not [employer] or employees and who are unaffiliated with [employer]. 2. [facility] is neither a transportation carrier nor a common or public carrier. 3. [facility] has no control over the quality or safety of the transportation that occurs as a result of the [employer]. 4. [facility] cannot guarantee that any third-party transportation provider will complete any arranged transportation service. 5. [facility] makes no representation, warranty, or guarantee regarding the reliability, timeliness, quality, safety, suitability, or availability of any of the Transport Services or that they will be error free. 6. I fully understand that traveling by vehicle involves risks and dangers of serious bodily injury, including permanent disability, paralysis, and death. I agree, on behalf of myself and on behalf of any minor child using the Transport Services for whom I am the parent or legal guardian, that the entire risk arising out of my use of the [employer] remains solely with me, to the maximum extent permitted under applicable law. 7. The [employer] are provided "as is" and "as available." [facility] disclaims all representations and warranties, express, implied or statutory, not expressly set out in these terms, including the implied warranties of merchantability and fitness for a particular purpose. 8. I hereby waive and release [facility], its agents, employees, officers, directors, representatives, insurers, attorneys, assigns, successors, subsidiaries, and affiliates from any and all past, present, or future claims, demands, liabilities, actions, causes of action, or suits of any kind directly or indirectly arising from acceptance and use of the [employer]. 9. I further waive and release [facility] and its affiliates from all present and future liability and responsibility for any injury or death to persons or damages to property caused by or related to the use of the [employer]. 10. I have read this Waiver and Release of Liability, and I understand the terms used in it and their legal significance. This Waiver is freely and voluntarily given with the understanding that my right (as well as the right of any minor child for whom I am the parent or legal guardian using the [employer]) to legal recourse against  in connection with the [employer] is knowingly surrendered in return for use of these services.   I attest that I read the consent document to [name], gave Ms. Brendel the opportunity to ask questions and answered the questions asked (if any). I affirm that [name] then provided consent for she's participation in this program.     Vivi Barrack

## 2020-06-13 MED ORDER — ACETAMINOPHEN 325 MG PO TABS: 650.0000 mg | ORAL_TABLET | ORAL | Status: DC | PRN

## 2020-06-13 MED ORDER — OXYCODONE-ACETAMINOPHEN 5-325 MG PO TABS
1.0000 | ORAL_TABLET | Freq: Once | ORAL | Status: AC
Start: 2020-06-13 — End: 2020-06-13
  Administered 2020-06-13: 1 via ORAL
  Filled 2020-06-13: qty 1

## 2020-06-13 MED ORDER — IBUPROFEN 600 MG PO TABS: 600.0000 mg | ORAL_TABLET | Freq: Four times a day (QID) | ORAL | 0 refills | Status: DC

## 2020-06-13 MED ORDER — OXYCODONE HCL 5 MG PO TABS: 5.0000 mg | ORAL_TABLET | Freq: Three times a day (TID) | ORAL | 0 refills | Status: DC | PRN

## 2020-06-13 NOTE — Lactation Note (Signed)
This note was copied from a baby's chart. Lactation Consultation Note  Patient Name: [name] [name] YPPJK'D Date: 06/13/2020 Reason for consult: Follow-up assessment Age:33 hours   P2 mother whose infant is now 41 hours old.  This is a term baby at 39+4 weeks.  Mother pumped and bottle fed for 8 months with her first child (now 65 years old).  Mother had questions regarding breast feeding which I answered to her satisfaction.  Baby began to arouse while I was present.  Offered to assist with latching and mother agreeable.  Assisted to latch in the football hold easily and baby began actively sucking; intermittent swallows noted.  Mother pleased.  Discussed plan for after discharge.  Grandmother present.   Maternal Data    Feeding    LATCH Score Latch: Grasps breast easily, tongue down, lips flanged, rhythmical sucking.  Audible Swallowing: Spontaneous and intermittent  Type of Nipple: Everted at rest and after stimulation  Comfort (Breast/Nipple): Soft / non-tender  Hold (Positioning): Assistance needed to correctly position infant at breast and maintain latch.  LATCH Score: 9   Lactation Tools Discussed/Used    Interventions    Discharge Discharge Education: Engorgement and breast care  Consult Status Consult Status: Complete Date: 06/13/20 Follow-up type: In-patient    Laurence Crofford R [name] [name] 06/13/2020, 8:22 AM

## 2020-06-19 ENCOUNTER — Encounter: Payer: Self-pay | Admitting: Certified Nurse Midwife

## 2020-07-04 ENCOUNTER — Ambulatory Visit (INDEPENDENT_AMBULATORY_CARE_PROVIDER_SITE_OTHER): Payer: Medicaid Other | Admitting: Student

## 2020-07-04 ENCOUNTER — Other Ambulatory Visit: Payer: Self-pay

## 2020-07-04 NOTE — Progress Notes (Signed)
Post Partum Visit Note  [name] [name] is a 35 y.o. G51P2002 female who presents for a postpartum visit. She is 3 weeks postpartum following a normal spontaneous vaginal delivery.  I have fully reviewed the prenatal and intrapartum course. The delivery was at 39.4 gestational weeks.  Anesthesia: none. Postpartum course has been unevetnful.  Baby is doing well\. Baby is feeding by breast. Bleeding staining only. Bowel function is normal. Bladder function is normal. Patient is not sexually active. Contraception method is Depo-Provera injections.  She received first dose in hospital. Postpartum depression screening: negative.   The pregnancy intention screening data noted above was reviewed. Potential methods of contraception were discussed. The patient elected to proceed with Hormonal Injection.    Edinburgh Postnatal Depression Scale - 07/04/20 1609       Edinburgh Postnatal Depression Scale:  In the Past 7 Days   I have been able to laugh and see the funny side of things. 0    I have looked forward with enjoyment to things. 0    I have blamed myself unnecessarily when things went wrong. 0    I have been anxious or worried for no good reason. 1    I have felt scared or panicky for no good reason. 0    Things have been getting on top of me. 0    I have been so unhappy that I have had difficulty sleeping. 0    I have felt sad or miserable. 0    I have been so unhappy that I have been crying. 0    The thought of harming myself has occurred to me. 0    Edinburgh Postnatal Depression Scale Total 1             Health Maintenance Due  Topic Date Due   COVID-19 Vaccine (3 - Booster for Pfizer series) 12/19/2019    The following portions of the patient's history were reviewed and updated as appropriate: allergies, current medications, past family history, past medical history, past social history, past surgical history, and problem list.  Review of Systems Pertinent items are noted  in HPI.  Objective:  BP 124/83   Pulse (!) 101   Wt 161 lb 4.8 oz (73.2 kg)   LMP 09/08/2019   BMI 29.50 kg/m    General:  alert, cooperative, and no distress   Breasts:  normal, no signs of abscess, nipples are intact with no bleeding, blebs or trauma  Lungs: clear to auscultation bilaterally  Heart:  regular rate and rhythm, S1, S2 normal, no murmur, click, rub or gallop  Abdomen: soft, non-tender; bowel sounds normal; no masses,  no organomegaly   Wound NA  GU exam:  not indicated       Assessment:   1. Postpartum care and examination      Health postpartum exam.   Plan:   Essential components of care per ACOG recommendations:  1.  Mood and well being: Patient with negative depression screening today. Reviewed local resources for support.  - Patient tobacco use? No.   - hx of drug use? No.    2. Infant care and feeding:  -Patient currently breastmilk feeding? Yes. Reviewed importance of draining breast regularly to support lactation.  -Social determinants of health (SDOH) reviewed in EPIC. No concerns. The following needs were identified  3. Sexuality, contraception and birth spacing - Patient does not want a pregnancy in the next year.  Desired family size is 2 children.  -  Reviewed forms of contraception in tiered fashion. Patient desired Depo-Provera today.   - Discussed birth spacing of 18 months  4. Sleep and fatigue -Encouraged family/partner/community support of 4 hrs of uninterrupted sleep to help with mood and fatigue  5. Physical Recovery  - Discussed patients delivery and complications. She describes her labor as good. - Patient had a Vaginal, no problems at delivery. Patient had a  No  laceration. Perineal healing reviewed. Patient expressed understanding - Patient has urinary incontinence? No. - Patient is safe to resume physical and sexual activity  6.  Health Maintenance - HM due items addressed  - Last pap smear  Diagnosis  Date Value Ref  Range Status  01/01/2020   Final   - Negative for Intraepithelial Lesions or Malignancy (NILM)  01/01/2020 - Benign reactive/reparative changes  Final   Pap smear not done at today's visit.  -Breast Cancer screening indicated? No.   7. Chronic Disease/Pregnancy Condition follow up: None  - PCP follow up  Marylene Land, CNM Center for [facility], [facility]

## 2020-08-29 ENCOUNTER — Other Ambulatory Visit: Payer: Self-pay

## 2020-08-29 ENCOUNTER — Ambulatory Visit (INDEPENDENT_AMBULATORY_CARE_PROVIDER_SITE_OTHER): Payer: Medicaid Other

## 2020-08-29 VITALS — BP 130/74 | HR 87 | Ht 62.0 in | Wt 188.2 lb

## 2020-08-29 DIAGNOSIS — Z3042 Encounter for surveillance of injectable contraceptive: Secondary | ICD-10-CM | POA: Diagnosis not present

## 2020-08-29 MED ORDER — MEDROXYPROGESTERONE ACETATE 150 MG/ML IM SUSP
150.0000 mg | Freq: Once | INTRAMUSCULAR | Status: AC
  Administered 2020-08-29: 150 mg via INTRAMUSCULAR

## 2020-08-29 NOTE — Progress Notes (Signed)
[name] [name] here for Depo-Provera Injection. Injection administered without complication. Patient will return in 3 months for next injection between Nov 3 and Nov 17,2022. Next annual visit due May 2023.   [name] Jarvis, RN 08/29/2020  9:19 AM

## 2020-08-30 NOTE — Progress Notes (Signed)
Chart reviewed for nurse visit. Agree with plan of care.   Jaylyn Iyer I, NP 08/30/2020 10:34 AM   

## 2020-11-15 ENCOUNTER — Ambulatory Visit: Payer: Medicaid Other

## 2023-01-13 NOTE — L&D Delivery Note (Signed)
 OB/GYN Faculty Practice Delivery Note  [name] [name] is a 38 y.o. [id number] s/p SVD at [redacted]w[redacted]d. She was admitted for induction of labor due to fetal growth restriction..   ROM: 0h 73m with copious clear fluid GBS Status:  Negative/-- (09/25 0856) Maximum Maternal Temperature:    Labor Progress: Initial SVE: 1.5cm. She then rapidly progressed to complete after eventual AROM.   Delivery Date/Time: 10/15/23, 0100 Delivery: Called to room due to deep, prolonged variable decels.  Pt noted immediate urge to push and patient  pushed uncontrollably.  was complete and pushing. Head delivered easily. No nuchal cord present. Shoulder and body delivered in usual fashion. Infant with spontaneous cry, placed on mother's abdomen, dried and stimulated. Cord clamped x 2 after 1-minute delay, and cut by physician. Cord blood drawn. Placenta delivered spontaneously with gentle cord traction. Fundus firm with massage and Pitocin . Labia, perineum, vagina, and cervix inspected inspected with no laceration.  Baby Weight: pending  Placenta: Sent to pathology Complications: None Lacerations: none EBL: 100 mL Analgesia: inhaled analgesic   Infant:  APGAR (1 MIN):  pend APGAR (5 MINS):   APGAR (10 MINS):        Jerilynn Buddle, MD

## 2023-06-30 ENCOUNTER — Ambulatory Visit

## 2023-06-30 DIAGNOSIS — Z3201 Encounter for pregnancy test, result positive: Secondary | ICD-10-CM

## 2023-06-30 LAB — POCT PREGNANCY, URINE: Preg Test, Ur: POSITIVE — AB

## 2023-06-30 NOTE — Progress Notes (Signed)
 Possible Pregnancy  Patient dropped off urine today for pregnancy confirmation. UPT in office today is positive. Pt reports she did not take a UPT at home, but noticed fetal movement around 2-3 weeks ago.  Reviewed dating with patient:   LMP: 01/27/23 Exact  EDD: 11/03/23 22w 0d today  1155 Called patient regarding results. OB history reviewed. Reviewed medications and allergies with patient; list of medications safe to take during pregnancy given via Mychart.  Recommended pt begin prenatal vitamin and schedule prenatal care. Patient verbalized that she is in contact with [employer] for this pregnancy. Patient denies any vaginal bleeding. Endorses some round ligament pain. Reviewed MAU precautions with patient. Patient would like to begin prenatal care in office. Informed patient that front office will contact patient regarding her virtual new OB intake and first prenatal appointment once it is scheduled. Anatomy US  MFM ordered for patient for 07/06/23 0700. Patient confirmed scheduled appointment.   Lennart Quitter, RN 06/30/2023  10:13 AM

## 2023-07-05 DIAGNOSIS — O09522 Supervision of elderly multigravida, second trimester: Secondary | ICD-10-CM | POA: Insufficient documentation

## 2023-07-06 ENCOUNTER — Ambulatory Visit: Payer: Self-pay

## 2023-07-06 ENCOUNTER — Ambulatory Visit: Attending: Obstetrics and Gynecology | Admitting: Obstetrics

## 2023-07-06 ENCOUNTER — Other Ambulatory Visit: Payer: Self-pay | Admitting: *Deleted

## 2023-07-06 ENCOUNTER — Ambulatory Visit (HOSPITAL_BASED_OUTPATIENT_CLINIC_OR_DEPARTMENT_OTHER)

## 2023-07-06 VITALS — BP 100/70 | HR 78

## 2023-07-06 DIAGNOSIS — Z3A22 22 weeks gestation of pregnancy: Secondary | ICD-10-CM | POA: Diagnosis not present

## 2023-07-06 DIAGNOSIS — O09292 Supervision of pregnancy with other poor reproductive or obstetric history, second trimester: Secondary | ICD-10-CM | POA: Insufficient documentation

## 2023-07-06 DIAGNOSIS — O0932 Supervision of pregnancy with insufficient antenatal care, second trimester: Secondary | ICD-10-CM | POA: Insufficient documentation

## 2023-07-06 DIAGNOSIS — O09522 Supervision of elderly multigravida, second trimester: Secondary | ICD-10-CM

## 2023-07-06 DIAGNOSIS — O99012 Anemia complicating pregnancy, second trimester: Secondary | ICD-10-CM | POA: Diagnosis not present

## 2023-07-06 DIAGNOSIS — O99112 Other diseases of the blood and blood-forming organs and certain disorders involving the immune mechanism complicating pregnancy, second trimester: Secondary | ICD-10-CM | POA: Insufficient documentation

## 2023-07-06 DIAGNOSIS — Z363 Encounter for antenatal screening for malformations: Secondary | ICD-10-CM | POA: Diagnosis not present

## 2023-07-06 DIAGNOSIS — D696 Thrombocytopenia, unspecified: Secondary | ICD-10-CM | POA: Diagnosis not present

## 2023-07-06 DIAGNOSIS — Z3201 Encounter for pregnancy test, result positive: Secondary | ICD-10-CM

## 2023-07-06 NOTE — Progress Notes (Signed)
 MFM Consult Note  [name] [name] is currently at 22 weeks and 6 days.  She was seen due to advanced maternal age (38 years old).  She has not initiated prenatal care in her pregnancy.  She denies any significant past medical history and denies any problems in her current pregnancy.    She has not had a screening test for fetal aneuploidy drawn in her current pregnancy.  She was informed that the fetal growth and amniotic fluid level were appropriate for her gestational age.  The fetal biometry measurements obtained today are consistent with an [facility] of November 03, 2023, making her 22 weeks and 6 days pregnant today.    There were no obvious fetal anomalies noted on today's ultrasound exam.  The patient was informed that anomalies may be missed due to technical limitations. If the fetus is in a suboptimal position or maternal habitus is increased, visualization of the fetus in the maternal uterus may be impaired.  The increased risk of fetal aneuploidy due to advanced maternal age was discussed. Due to advanced maternal age, the patient was offered and declined an amniocentesis today for definitive diagnosis of fetal aneuploidy.  The patient had a cell free DNA test drawn following today's ultrasound exam to screen for fetal aneuploidy.  Our genetic counselor will notify the patient regarding the results of this test.    A follow-up exam was scheduled in 6 weeks to assess the fetal growth.    The patient stated that all of her questions were answered today.  A total of 30 minutes was spent counseling and coordinating the care for this patient.  Greater than 50% of the time was spent in direct face-to-face contact.

## 2023-07-09 ENCOUNTER — Encounter: Payer: Self-pay | Admitting: Obstetrics and Gynecology

## 2023-07-09 DIAGNOSIS — O0990 Supervision of high risk pregnancy, unspecified, unspecified trimester: Secondary | ICD-10-CM | POA: Insufficient documentation

## 2023-07-09 DIAGNOSIS — Z3400 Encounter for supervision of normal first pregnancy, unspecified trimester: Secondary | ICD-10-CM | POA: Insufficient documentation

## 2023-07-12 LAB — PANORAMA PRENATAL TEST FULL PANEL:PANORAMA TEST PLUS 5 ADDITIONAL MICRODELETIONS: FETAL FRACTION: 8.2

## 2023-07-13 ENCOUNTER — Ambulatory Visit: Payer: Self-pay

## 2023-07-27 ENCOUNTER — Telehealth

## 2023-07-27 ENCOUNTER — Telehealth: Payer: Self-pay

## 2023-07-27 DIAGNOSIS — O0992 Supervision of high risk pregnancy, unspecified, second trimester: Secondary | ICD-10-CM

## 2023-07-27 DIAGNOSIS — Z3A25 25 weeks gestation of pregnancy: Secondary | ICD-10-CM

## 2023-07-27 DIAGNOSIS — O0990 Supervision of high risk pregnancy, unspecified, unspecified trimester: Secondary | ICD-10-CM

## 2023-07-27 MED ORDER — BLOOD PRESSURE KIT DEVI: 1.0000 | 0 refills | Status: DC | PRN

## 2023-07-27 MED ORDER — GOJJI WEIGHT SCALE MISC: 1.0000 | 0 refills | Status: DC | PRN

## 2023-07-27 NOTE — Progress Notes (Signed)
 New OB Intake  I connected with [name] [name]  on 07/27/23 at 10:15 AM EDT by MyChart Video Visit and verified that I am speaking with the correct person using two identifiers. Nurse is located at [facility] and pt is located at home.  I discussed the limitations, risks, security and privacy concerns of performing an evaluation and management service by telephone and the availability of in person appointments. I also discussed with the patient that there may be a patient responsible charge related to this service. The patient expressed understanding and agreed to proceed.  I explained I am completing New OB Intake today. We discussed EDD of 11/03/2023 based on LMP of 01/27/2023. Pt is G3P2002. I reviewed her allergies, medications and Medical/Surgical/OB history.    Patient Active Problem List   Diagnosis Date Noted   Supervision of high risk pregnancy, antepartum 07/09/2023   AMA (advanced maternal age) multigravida 35+, second trimester 07/05/2023   History of prior pregnancy with IUGR newborn 01/30/2020   Group B streptococcal bacteriuria 12/11/2019   Thrombocytopenia, unspecified (HCC) 09/24/2013     Concerns addressed today --Placing baby for adoption   Delivery Plans Plans to deliver at [facility]. Discussed the nature of our practice with multiple providers including residents and students. Due to the size of the practice, the delivering provider may not be the same as those providing prenatal care.   Patient is not interested in water birth.  MyChart/Babyscripts MyChart access verified. I explained pt will have some visits in office and some virtually. Babyscripts instructions given and order placed. Patient verifies receipt of registration text/e-mail. Account successfully created and app downloaded. If patient is a candidate for Optimized scheduling, add to sticky note.   Blood Pressure Cuff/Weight Scale Blood pressure cuff ordered for patient to pick-up from [employer].  Explained after first prenatal appt pt will check weekly and document in Babyscripts. Patient does not have weight scale; order sent to [facility], patient may track weight weekly in Babyscripts.  Anatomy US  Patient had an anatomy scan done on   Is patient a CenteringPregnancy candidate?  Declined Declined due to Schedule Is patient a Mom+Baby Combined Care candidate?  Not a candidate   If accepted, confirm patient does not intend to move from the area for at least 12 months, then notify Mom+Baby staff  Is patient a candidate for Babyscripts Optimization? Yes, patient declined   First visit review I reviewed new OB appt with patient. Explained pt will be seen by Norleen Rover MD at first visit. Discussed [place] genetic screening with patient. Yes collected Panorama and Horizon 03/15/2020;Negative. Routine prenatal labs is not   Last Pap Diagnosis  Date Value Ref Range Status  01/01/2020   Final   - Negative for Intraepithelial Lesions or Malignancy (NILM)  01/01/2020 - Benign reactive/reparative changes  Final    Ermalinda DEEM, CMA 07/27/2023  10:34 AM

## 2023-08-02 ENCOUNTER — Other Ambulatory Visit: Payer: Self-pay

## 2023-08-02 ENCOUNTER — Ambulatory Visit (INDEPENDENT_AMBULATORY_CARE_PROVIDER_SITE_OTHER): Payer: Self-pay | Admitting: Family Medicine

## 2023-08-02 ENCOUNTER — Other Ambulatory Visit (HOSPITAL_COMMUNITY)
Admission: RE | Admit: 2023-08-02 | Discharge: 2023-08-02 | Disposition: A | Discharge status: Home / Self Care | Payer: Self-pay | Source: Ambulatory Visit | Attending: Family Medicine | Admitting: Family Medicine

## 2023-08-02 ENCOUNTER — Encounter: Payer: Self-pay | Admitting: Family Medicine

## 2023-08-02 VITALS — BP 113/75 | HR 76 | Wt 148.7 lb

## 2023-08-02 DIAGNOSIS — O0990 Supervision of high risk pregnancy, unspecified, unspecified trimester: Secondary | ICD-10-CM

## 2023-08-02 DIAGNOSIS — Z8759 Personal history of other complications of pregnancy, childbirth and the puerperium: Secondary | ICD-10-CM

## 2023-08-02 DIAGNOSIS — O09522 Supervision of elderly multigravida, second trimester: Secondary | ICD-10-CM

## 2023-08-02 DIAGNOSIS — D696 Thrombocytopenia, unspecified: Secondary | ICD-10-CM

## 2023-08-02 DIAGNOSIS — Z3A26 26 weeks gestation of pregnancy: Secondary | ICD-10-CM

## 2023-08-02 DIAGNOSIS — Z1332 Encounter for screening for maternal depression: Secondary | ICD-10-CM

## 2023-08-02 DIAGNOSIS — Z8659 Personal history of other mental and behavioral disorders: Secondary | ICD-10-CM

## 2023-08-02 DIAGNOSIS — O0992 Supervision of high risk pregnancy, unspecified, second trimester: Secondary | ICD-10-CM

## 2023-08-02 DIAGNOSIS — Z3009 Encounter for other general counseling and advice on contraception: Secondary | ICD-10-CM

## 2023-08-02 NOTE — Progress Notes (Signed)
 Subjective:   [name] [name] is a 38 y.o. G3P2002 at [redacted]w[redacted]d by LMP being seen today for her first obstetrical visit.  Her obstetrical history is significant for advanced maternal age. Patient does not intend to breast feed. Pregnancy history fully reviewed.  Patient reports no complaints.  HISTORY: OB History  Gravida Para Term Preterm AB Living  3 2 2  0 0 2  SAB IAB Ectopic Multiple Live Births  0 0 0 0 2    # Outcome Date GA Lbr Len/2nd Weight Sex Type Anes PTL Lv  3 Current           2 Term 06/11/20 [redacted]w[redacted]d 01:26 / 00:10 6 lb 8.2 oz (2.954 kg) F Vag-Spont None  LIV     Name: [name] [name]     Apgar1: 9  Apgar5: 9  1 Term 11/25/13 [redacted]w[redacted]d / 00:04 4 lb 0.2 oz (1.82 kg) M Vag-Spont None  LIV     Apgar1: 9  Apgar5: 9   Last pap smear was 01/01/2020 and was normal Past Medical History:  Diagnosis Date   GERD (gastroesophageal reflux disease)    OTC as needed   Gonorrhea    History of trichomoniasis    Laceration of right thumb 08/16/2011   right thumb digital nerve lac.; sutures x 8, per pt.   Past Surgical History:  Procedure Laterality Date   NERVE REPAIR  08/31/2011   Procedure: NERVE REPAIR;  Surgeon: [name] JONELLE Curia, MD;  Location: [facility];  Service: Orthopedics;  Laterality: Right;  right thumb digital nerve repair   WISDOM TOOTH EXTRACTION     Family History  Problem Relation Age of Onset   Stroke Maternal Grandfather    Heart disease Maternal Grandfather    Hypertension Mother    Hearing loss Neg Hx    Social History   Tobacco Use   Smoking status: Former    Current packs/day: 0.00    Types: Cigarettes    Start date: 05/05/2009    Quit date: 05/05/2013    Years since quitting: 10.2   Smokeless tobacco: Never   Tobacco comments:    quit with pos preg  Vaping Use   Vaping status: Never Used  Substance Use Topics   Alcohol use: No   Drug use: No   Allergies  Allergen Reactions   Sulfonamide Derivatives Hives   Current  Outpatient Medications on File Prior to Visit  Medication Sig Dispense Refill   Prenatal MV & Min w/FA-DHA (PRENATAL GUMMIES PO) Take 2 tablets by mouth daily.     acetaminophen  (TYLENOL ) 325 MG tablet Take 2 tablets (650 mg total) by mouth every 4 (four) hours as needed (for pain scale < 4). (Patient not taking: Reported on 07/27/2023)     Blood Pressure Monitoring (BLOOD PRESSURE KIT) DEVI 1 Device by Does not apply route as needed. 1 each 0   ibuprofen  (ADVIL ) 600 MG tablet Take 1 tablet (600 mg total) by mouth every 6 (six) hours. (Patient not taking: Reported on 07/27/2023) 30 tablet 0   Misc. Devices (GOJJI WEIGHT SCALE) MISC 1 Device by Does not apply route as needed. 1 each 0   No current facility-administered medications on file prior to visit.     Exam   Vitals:   08/02/23 0918  BP: 113/75  Pulse: 76  Weight: 148 lb 11.2 oz (67.4 kg)   Fetal Heart Rate (bpm): 134  Uterus:     Pelvic Exam: Perineum: no hemorrhoids,  normal perineum   Vulva: normal external genitalia, no lesions   Vagina:  normal mucosa, normal discharge   Cervix: no lesions and normal, pap smear done.    Adnexa: normal adnexa and no mass, fullness, tenderness   Bony Pelvis: average  System: General: well-developed, well-nourished female in no acute distress   Breast:  normal appearance, no masses or tenderness   Skin: normal coloration and turgor, no rashes   Neurologic: oriented, normal, negative, normal mood   Extremities: normal strength, tone, and muscle mass, ROM of all joints is normal   HEENT PERRLA, extraocular movement intact and sclera clear, anicteric   Mouth/Teeth mucous membranes moist, pharynx normal without lesions and dental hygiene good   Neck supple and no masses   Cardiovascular: regular rate and rhythm   Respiratory:  no respiratory distress, normal breath sounds   Abdomen: soft, non-tender; bowel sounds normal; no masses,  no organomegaly     Assessment:   Pregnancy:  [id number] Patient Active Problem List   Diagnosis Date Noted   History of postpartum depression 08/02/2023   Supervision of high risk pregnancy, antepartum 07/09/2023   AMA (advanced maternal age) multigravida 35+, second trimester 07/05/2023   History of prior pregnancy with IUGR newborn 01/30/2020   Group B streptococcal bacteriuria 12/11/2019   Thrombocytopenia, unspecified (HCC) 09/24/2013     Plan:  1. Supervision of high risk pregnancy, antepartum FHR and BP appropriate today Initial prenatal labs being collected today - Cervicovaginal ancillary only - Ambulatory referral to Integrated Behavioral Health  2. History of postpartum depression (Primary) Patient reports horrible postpartum depression after previous pregnancy.  Would like referral for integrated behavioral health.  Referral placed. - Ambulatory referral to Integrated Behavioral Health  3. History of prior pregnancy with IUGR newborn Has seen maternal-fetal medicine  4. AMA (advanced maternal age) multigravida 35+, second trimester  5. Thrombocytopenia, unspecified (HCC) CBC collected today  6. Unwanted fertility Tubal papers signed today  7. [redacted] weeks gestation of pregnancy    Initial labs drawn. Continue prenatal vitamins. Genetic Screening discussed, previously collected Ultrasound discussed; fetal anatomic survey: results reviewed. Problem list reviewed and updated. The nature of [facility] with multiple MDs and other Advanced Practice Providers was explained to patient; also emphasized that residents, students are part of our team. Routine obstetric precautions reviewed. No follow-ups on file.

## 2023-08-03 LAB — CBC/D/PLT+RPR+RH+ABO+RUBIGG...
Antibody Screen: NEGATIVE
Basophils Absolute: 0 x10E3/uL (ref 0.0–0.2)
Basos: 0 %
EOS (ABSOLUTE): 0.2 x10E3/uL (ref 0.0–0.4)
Eos: 2 %
HCV Ab: NONREACTIVE
HIV Screen 4th Generation wRfx: NONREACTIVE
Hematocrit: 30.4 % — ABNORMAL LOW (ref 34.0–46.6)
Hemoglobin: 9.3 g/dL — ABNORMAL LOW (ref 11.1–15.9)
Hepatitis B Surface Ag: NEGATIVE
Immature Grans (Abs): 0 x10E3/uL (ref 0.0–0.1)
Immature Granulocytes: 0 %
Lymphocytes Absolute: 2 x10E3/uL (ref 0.7–3.1)
Lymphs: 25 %
MCH: 26.7 pg (ref 26.6–33.0)
MCHC: 30.6 g/dL — ABNORMAL LOW (ref 31.5–35.7)
MCV: 87 fL (ref 79–97)
Monocytes Absolute: 0.5 x10E3/uL (ref 0.1–0.9)
Monocytes: 6 %
Neutrophils Absolute: 5.3 x10E3/uL (ref 1.4–7.0)
Neutrophils: 67 %
Platelets: 132 x10E3/uL — ABNORMAL LOW (ref 150–450)
RBC: 3.48 x10E6/uL — ABNORMAL LOW (ref 3.77–5.28)
RDW: 15.7 % — ABNORMAL HIGH (ref 11.7–15.4)
RPR Ser Ql: NONREACTIVE
Rh Factor: POSITIVE
Rubella Antibodies, IGG: 1.73 {index}
WBC: 8 x10E3/uL (ref 3.4–10.8)

## 2023-08-03 LAB — HEMOGLOBIN A1C
Est. average glucose Bld gHb Est-mCnc: 103 mg/dL
Hgb A1c MFr Bld: 5.2 % (ref 4.8–5.6)

## 2023-08-03 LAB — GLUCOSE TOLERANCE, 2 HOURS W/ 1HR
Glucose, 1 hour: 69 mg/dL — ABNORMAL LOW (ref 70–179)
Glucose, 2 hour: 107 mg/dL (ref 70–152)
Glucose, Fasting: 68 mg/dL — ABNORMAL LOW (ref 70–91)

## 2023-08-03 LAB — HCV INTERPRETATION

## 2023-08-04 ENCOUNTER — Ambulatory Visit: Payer: Self-pay | Admitting: Family Medicine

## 2023-08-04 LAB — URINE CULTURE, OB REFLEX

## 2023-08-04 LAB — CERVICOVAGINAL ANCILLARY ONLY
Chlamydia: NEGATIVE
Comment: NEGATIVE
Comment: NEGATIVE
Comment: NORMAL
Neisseria Gonorrhea: NEGATIVE
Trichomonas: POSITIVE — AB

## 2023-08-04 LAB — CULTURE, OB URINE

## 2023-08-04 MED ORDER — METRONIDAZOLE 500 MG PO TABS: 500.0000 mg | ORAL_TABLET | Freq: Two times a day (BID) | ORAL | 0 refills | Status: DC

## 2023-08-17 ENCOUNTER — Ambulatory Visit (HOSPITAL_BASED_OUTPATIENT_CLINIC_OR_DEPARTMENT_OTHER): Payer: Self-pay

## 2023-08-17 ENCOUNTER — Ambulatory Visit

## 2023-08-17 ENCOUNTER — Ambulatory Visit: Payer: Self-pay | Attending: Obstetrics and Gynecology | Admitting: Maternal & Fetal Medicine

## 2023-08-17 VITALS — BP 111/60

## 2023-08-17 DIAGNOSIS — D696 Thrombocytopenia, unspecified: Secondary | ICD-10-CM | POA: Insufficient documentation

## 2023-08-17 DIAGNOSIS — O09523 Supervision of elderly multigravida, third trimester: Secondary | ICD-10-CM | POA: Insufficient documentation

## 2023-08-17 DIAGNOSIS — O09522 Supervision of elderly multigravida, second trimester: Secondary | ICD-10-CM

## 2023-08-17 DIAGNOSIS — Z3A28 28 weeks gestation of pregnancy: Secondary | ICD-10-CM | POA: Insufficient documentation

## 2023-08-17 DIAGNOSIS — O0990 Supervision of high risk pregnancy, unspecified, unspecified trimester: Secondary | ICD-10-CM

## 2023-08-17 DIAGNOSIS — O99113 Other diseases of the blood and blood-forming organs and certain disorders involving the immune mechanism complicating pregnancy, third trimester: Secondary | ICD-10-CM | POA: Insufficient documentation

## 2023-08-17 DIAGNOSIS — O09293 Supervision of pregnancy with other poor reproductive or obstetric history, third trimester: Secondary | ICD-10-CM

## 2023-08-17 DIAGNOSIS — Z3689 Encounter for other specified antenatal screening: Secondary | ICD-10-CM | POA: Insufficient documentation

## 2023-08-17 DIAGNOSIS — O0933 Supervision of pregnancy with insufficient antenatal care, third trimester: Secondary | ICD-10-CM

## 2023-08-17 NOTE — Progress Notes (Signed)
   Patient information  Patient Name: [name] [name]  Patient MRN:   [id number]  Referring practice: MFM Referring Provider: [facility])  Problem List   Patient Active Problem List   Diagnosis Date Noted   History of postpartum depression 08/02/2023   Supervision of high risk pregnancy, antepartum 07/09/2023   AMA (advanced maternal age) multigravida 35+, second trimester 07/05/2023   History of prior pregnancy with IUGR newborn 01/30/2020   Group B streptococcal bacteriuria 12/11/2019   Thrombocytopenia, unspecified (HCC) 09/24/2013    Maternal Fetal medicine Consult  [name] [name] is a 38 y.o. G3P2002 at [redacted]w[redacted]d here for ultrasound and consultation. [name] [name] is doing well today with no acute concerns. Today we focused on the following:   Gest thrombocytopenia: Plt count most recently 132k. I discussed the implications for anesthesia based on this platelet count alone. She will need her CBC repeat around 35w and then again at admission. Anesthesia consult upon admission.   The patient had time to ask questions that were answered to her satisfaction.  She verbalized understanding and agrees to proceed with the plan below.  Sonographic findings Single intrauterine pregnancy at 28w 6d.  Fetal cardiac activity:  Observed and appears normal. Presentation: Cephalic. Interval fetal anatomy appears normal. Fetal biometry shows the estimated fetal weight at the 64 percentile. Amniotic fluid volume: Within normal limits. MVP: 4.17 cm. Placenta: Posterior.  There are limitations of prenatal ultrasound such as the inability to detect certain abnormalities due to poor visualization. Various factors such as fetal position, gestational age and maternal body habitus may increase the difficulty in visualizing the fetal anatomy.    Recommendations -No further ultrasounds are recommended at this time based on the current indications. If future indications  arise (e.g. size/date discrepancy on fundal height, gestational diabetes or hypertension) and an ultrasound is to be desired at our MFM office, please send a referral.   Review of Systems: A review of systems was performed and was negative except per HPI   Vitals and Physical Exam    08/17/2023    2:59 PM 08/02/2023    9:18 AM 07/06/2023    7:28 AM  Vitals with BMI  Weight  148 lbs 11 oz   Systolic 111 113 899  Diastolic 60 75 70  Pulse  76 78    Sitting comfortably on the sonogram table Nonlabored breathing Normal rate and rhythm Abdomen is nontender  Past pregnancies OB History  Gravida Para Term Preterm AB Living  3 2 2   2   SAB IAB Ectopic Multiple Live Births     0 2    # Outcome Date GA Lbr Len/2nd Weight Sex Type Anes PTL Lv  3 Current           2 Term 06/11/20 [redacted]w[redacted]d 01:26 / 00:10 6 lb 8.2 oz (2.954 kg) F Vag-Spont None  LIV  1 Term 11/25/13 [redacted]w[redacted]d / 00:04 4 lb 0.2 oz (1.82 kg) M Vag-Spont None  LIV     I spent 20 minutes reviewing the patients chart, including labs and images as well as counseling the patient about her medical conditions. Greater than 50% of the time was spent in direct face-to-face patient counseling.  Delora Smaller  MFM, [place]   08/17/2023  3:40 PM

## 2023-08-23 ENCOUNTER — Other Ambulatory Visit: Payer: Self-pay

## 2023-08-23 ENCOUNTER — Encounter: Payer: Self-pay | Admitting: Obstetrics and Gynecology

## 2023-08-23 ENCOUNTER — Ambulatory Visit (INDEPENDENT_AMBULATORY_CARE_PROVIDER_SITE_OTHER): Payer: Self-pay | Admitting: Obstetrics and Gynecology

## 2023-08-23 VITALS — BP 109/68 | HR 95 | Wt 153.0 lb

## 2023-08-23 DIAGNOSIS — O23593 Infection of other part of genital tract in pregnancy, third trimester: Secondary | ICD-10-CM

## 2023-08-23 DIAGNOSIS — O0932 Supervision of pregnancy with insufficient antenatal care, second trimester: Secondary | ICD-10-CM | POA: Insufficient documentation

## 2023-08-23 DIAGNOSIS — A5901 Trichomonal vulvovaginitis: Secondary | ICD-10-CM | POA: Insufficient documentation

## 2023-08-23 DIAGNOSIS — O09522 Supervision of elderly multigravida, second trimester: Secondary | ICD-10-CM

## 2023-08-23 DIAGNOSIS — O0973 Supervision of high risk pregnancy due to social problems, third trimester: Secondary | ICD-10-CM

## 2023-08-23 DIAGNOSIS — Z3A29 29 weeks gestation of pregnancy: Secondary | ICD-10-CM

## 2023-08-23 DIAGNOSIS — O0993 Supervision of high risk pregnancy, unspecified, third trimester: Secondary | ICD-10-CM

## 2023-08-23 DIAGNOSIS — D696 Thrombocytopenia, unspecified: Secondary | ICD-10-CM

## 2023-08-23 DIAGNOSIS — O99013 Anemia complicating pregnancy, third trimester: Secondary | ICD-10-CM

## 2023-08-23 DIAGNOSIS — O09523 Supervision of elderly multigravida, third trimester: Secondary | ICD-10-CM

## 2023-08-23 DIAGNOSIS — O0933 Supervision of pregnancy with insufficient antenatal care, third trimester: Secondary | ICD-10-CM

## 2023-08-23 DIAGNOSIS — O0990 Supervision of high risk pregnancy, unspecified, unspecified trimester: Secondary | ICD-10-CM

## 2023-08-23 DIAGNOSIS — Z8759 Personal history of other complications of pregnancy, childbirth and the puerperium: Secondary | ICD-10-CM

## 2023-08-23 NOTE — Progress Notes (Signed)
 PRENATAL VISIT NOTE  Subjective:  [name] [name] is a 38 y.o. G3P2002 at [redacted]w[redacted]d being seen today for ongoing prenatal care.  She is currently monitored for the following issues for this high-risk pregnancy and has Thrombocytopenia, unspecified (HCC); History of prior pregnancy with IUGR newborn; Anemia of pregnancy; AMA (advanced maternal age) multigravida 35+, second trimester; Supervision of high risk pregnancy, antepartum; History of postpartum depression; Trichomonal vaginitis in pregnancy in second trimester; Late prenatal care affecting pregnancy in second trimester; and Supervision of high risk pregnancy due to social problems in third trimester on their problem list.  Patient reports no complaints.  Contractions: Not present. Vag. Bleeding: None.  Movement: Present. Denies leaking of fluid.   The following portions of the patient's history were reviewed and updated as appropriate: allergies, current medications, past family history, past medical history, past social history, past surgical history and problem list.   Objective:    Vitals:   08/23/23 1100  BP: 109/68  Pulse: 95  Weight: 153 lb (69.4 kg)    Fetal Status:  Fetal Heart Rate (bpm): 143   Movement: Present    General: Alert, oriented and cooperative. Patient is in no acute distress.  Skin: Skin is warm and dry. No rash noted.   Cardiovascular: Normal heart rate noted  Respiratory: Normal respiratory effort, no problems with respiration noted  Abdomen: Soft, gravid, appropriate for gestational age.  Pain/Pressure: Absent     Pelvic: Cervical exam deferred        Extremities: Normal range of motion.  Edema: None  Mental Status: Normal mood and affect. Normal behavior. Normal judgment and thought content.   Assessment and Plan:  Pregnancy: G3P2002 at [redacted]w[redacted]d 1. [redacted] weeks gestation of pregnancy (Primary) D/w her more re: birth control next visit - Anemia Profile B - Platelet count; Future - VITAMIN D  25 Hydroxy  (Vit-D Deficiency, Fractures)  2. Supervision of high risk pregnancy, antepartum - Anemia Profile B - Platelet count; Future - VITAMIN D  25 Hydroxy (Vit-D Deficiency, Fractures)  3. Thrombocytopenia, unspecified (HCC) D/w pt and pt aware as had it with prior pregnancies. Re-check today    Latest Ref Rng & Units 08/02/2023    8:46 AM 06/11/2020    2:30 PM 06/04/2020   10:05 AM  CBC  WBC 3.4 - 10.8 x10E3/uL 8.0  9.9  10.7   Hemoglobin 11.1 - 15.9 g/dL 9.3  87.9  88.6   Hematocrit 34.0 - 46.6 % 30.4  38.6  35.6   Platelets 150 - 450 x10E3/uL 132  91  82    - Anemia Profile B - Platelet count; Future - VITAMIN D  25 Hydroxy (Vit-D Deficiency, Fractures)  4. Anemia during pregnancy in third trimester See above. D/w her re: iv iron  most likely needed - Anemia Profile B - Platelet count; Future - VITAMIN D  25 Hydroxy (Vit-D Deficiency, Fractures)  5. AMA (advanced maternal age) multigravida 35+, second trimester Low risk panorama - Anemia Profile B - Platelet count; Future - VITAMIN D  25 Hydroxy (Vit-D Deficiency, Fractures)  6. History of prior pregnancy with IUGR newborn Recommend 36wk f/u growth scan. Can schedule at next visit  7. Late prenatal care affecting pregnancy in second trimester  8. Trichomonal vaginitis in pregnancy in second trimester Test of cure early sept  9. Supervision of high risk pregnancy due to social problems in third trimester Considering placing baby up for adoption. Pt here today with case worker  Preterm labor symptoms and general obstetric precautions including but  not limited to vaginal bleeding, contractions, leaking of fluid and fetal movement were reviewed in detail with the patient. Please refer to After Visit Summary for other counseling recommendations.   Return in about 2 weeks (around 09/06/2023) for low risk ob, md or app.  Future Appointments  Date Time Provider Department Center  08/30/2023  2:45 PM [facility] HEALTH CLINICIAN  [facility]    Bebe Furry, MD

## 2023-08-24 ENCOUNTER — Ambulatory Visit: Payer: Self-pay | Admitting: Obstetrics and Gynecology

## 2023-08-24 DIAGNOSIS — R7989 Other specified abnormal findings of blood chemistry: Secondary | ICD-10-CM | POA: Insufficient documentation

## 2023-08-24 DIAGNOSIS — O99113 Other diseases of the blood and blood-forming organs and certain disorders involving the immune mechanism complicating pregnancy, third trimester: Secondary | ICD-10-CM

## 2023-08-24 DIAGNOSIS — D509 Iron deficiency anemia, unspecified: Secondary | ICD-10-CM

## 2023-08-24 DIAGNOSIS — D696 Thrombocytopenia, unspecified: Secondary | ICD-10-CM

## 2023-08-24 LAB — ANEMIA PROFILE B
Basophils Absolute: 0 x10E3/uL (ref 0.0–0.2)
Basos: 0 %
EOS (ABSOLUTE): 0.1 x10E3/uL (ref 0.0–0.4)
Eos: 1 %
Ferritin: 14 ng/mL — ABNORMAL LOW (ref 15–150)
Folate: >20 ng/mL (ref >3.0)
Hematocrit: 28.2 % — ABNORMAL LOW (ref 34.0–46.6)
Hemoglobin: 8.9 g/dL — ABNORMAL LOW (ref 11.1–15.9)
Immature Grans (Abs): 0 x10E3/uL (ref 0.0–0.1)
Immature Granulocytes: 0 %
Iron Saturation: 18 % (ref 15–55)
Iron: 91 ug/dL (ref 27–159)
Lymphocytes Absolute: 2.1 x10E3/uL (ref 0.7–3.1)
Lymphs: 24 %
MCH: 27.4 pg (ref 26.6–33.0)
MCHC: 31.6 g/dL (ref 31.5–35.7)
MCV: 87 fL (ref 79–97)
Monocytes Absolute: 0.6 x10E3/uL (ref 0.1–0.9)
Monocytes: 7 %
Neutrophils Absolute: 6.1 x10E3/uL (ref 1.4–7.0)
Neutrophils: 68 %
Platelets: 123 x10E3/uL — ABNORMAL LOW (ref 150–450)
RBC: 3.25 x10E6/uL — ABNORMAL LOW (ref 3.77–5.28)
RDW: 15.8 % — ABNORMAL HIGH (ref 11.7–15.4)
Retic Ct Pct: 1.5 % (ref 0.6–2.6)
Total Iron Binding Capacity: 510 ug/dL — ABNORMAL HIGH (ref 250–450)
UIBC: 419 ug/dL (ref 131–425)
Vitamin B-12: 473 pg/mL (ref 232–1245)
WBC: 9 x10E3/uL (ref 3.4–10.8)

## 2023-08-24 LAB — VITAMIN D 25 HYDROXY (VIT D DEFICIENCY, FRACTURES): Vit D, 25-Hydroxy: 18.4 ng/mL — ABNORMAL LOW (ref 30.0–100.0)

## 2023-08-24 MED ORDER — VITAMIN D (ERGOCALCIFEROL) 1.25 MG (50000 UNIT) PO CAPS: 50000.0000 [IU] | ORAL_CAPSULE | ORAL | 0 refills | Status: AC

## 2023-08-27 NOTE — BH Specialist Note (Deleted)
 Integrated Behavioral Health via Telemedicine Visit  08/27/2023 DELYLA SANDEEN [id number]  Number of Integrated Behavioral Health Clinician visits: No data recorded Session Start time: No data recorded  Session End time: No data recorded Total time in minutes: No data recorded   Referring Provider: Norleen Rover, MD Patient/Family location: Home*** [facility] Provider location: [facility] for [facility]  All persons participating in visit: Patient [name] [name] and [facility] Aaron Bostwick ***  Types of Service: {CHL AMB TYPE OF [phone]}  I connected with Tita LITTIE Metro and/or Tita L Berke's {family members:20773} via  Telephone or Video Enabled Telemedicine Application  (Video is Caregility application) and verified that I am speaking with the correct person using two identifiers. Discussed confidentiality: Yes   I discussed the limitations of telemedicine and the availability of in person appointments.  Discussed there is a possibility of technology failure and discussed alternative modes of communication if that failure occurs.  I discussed that engaging in this telemedicine visit, they consent to the provision of behavioral healthcare and the services will be billed under their insurance.  Patient and/or legal guardian expressed understanding and consented to Telemedicine visit: Yes   Presenting Concerns: Patient and/or family reports the following symptoms/concerns: *** Duration of problem: ***; Severity of problem: {Mild/Moderate/Severe:20260}  Patient and/or Family's Strengths/Protective Factors: {CHL AMB BH PROTECTIVE [phone]}  Goals Addressed: Patient will:  Reduce symptoms of: {IBH Symptoms:21014056}   Increase knowledge and/or ability of: {IBH Patient Tools:21014057}   Demonstrate ability to: {IBH Goals:21014053}  Progress towards Goals: {CHL AMB BH PROGRESS TOWARDS  GOALS:[phone]}    Interventions: Interventions utilized:  {IBH Interventions:21014054} Standardized Assessments completed: {IBH Screening Tools:21014051}    Patient and/or Family Response: Patient agrees with treatment plan.   Clinical Assessment/Diagnosis  No diagnosis found.   Patient may benefit from psychoeducation and brief therapeutic interventions regarding coping with symptoms of *** .  Plan: Follow up with behavioral health clinician on : *** Behavioral recommendations:  -*** -*** Referral(s): {IBH Referrals:21014055}  I discussed the assessment and treatment plan with the patient and/or parent/guardian. They were provided an opportunity to ask questions and all were answered. They agreed with the plan and demonstrated an understanding of the instructions.   They were advised to call back or seek an in-person evaluation if the symptoms worsen or if the condition fails to improve as anticipated.  [name], LCSW     08/02/2023    9:56 AM 08/29/2020   11:56 AM 07/04/2020    5:10 PM 06/11/2020    4:55 PM 06/04/2020    9:19 AM  Depression screen PHQ 2/9  Decreased Interest 0 0 0 0 0  Down, Depressed, Hopeless 0 0 0 0 0  PHQ - 2 Score 0 0 0 0 0  Altered sleeping 0 0 0 0 1  Tired, decreased energy 0 0 0 0 1  Change in appetite 0 0 0 0 0  Feeling bad or failure about yourself  0 0 0 0 0  Trouble concentrating 0 0 1 0 0  Moving slowly or fidgety/restless 0 0 0 0 0  Suicidal thoughts 0 0 0 0 0  PHQ-9 Score 0 0 1 0 2  Difficult doing work/chores  Not difficult at all         08/02/2023    9:57 AM 08/29/2020   11:57 AM 07/04/2020    5:11 PM 06/11/2020    4:55 PM  GAD 7 : Generalized Anxiety Score  Nervous, Anxious, on Edge 0 0 0 1  Control/stop worrying 0 0 0 0  Worry too much - different things 0 0 0 0  Trouble relaxing 0 0 0 1  Restless 0 0 0 0  Easily annoyed or irritable 0 0 1 1  Afraid - awful might happen 0 0 0 0  Total GAD 7 Score 0 0 1 3

## 2023-09-06 ENCOUNTER — Telehealth: Payer: Self-pay

## 2023-09-06 NOTE — Telephone Encounter (Signed)
Spoke with patient and confirmed appointment for 8/26

## 2023-09-07 ENCOUNTER — Other Ambulatory Visit (HOSPITAL_COMMUNITY): Payer: Self-pay | Admitting: Hematology and Oncology

## 2023-09-07 ENCOUNTER — Ambulatory Visit (INDEPENDENT_AMBULATORY_CARE_PROVIDER_SITE_OTHER): Payer: Self-pay | Admitting: Obstetrics and Gynecology

## 2023-09-07 ENCOUNTER — Inpatient Hospital Stay: Payer: Self-pay

## 2023-09-07 ENCOUNTER — Other Ambulatory Visit: Payer: Self-pay

## 2023-09-07 ENCOUNTER — Encounter: Payer: Self-pay | Admitting: Obstetrics and Gynecology

## 2023-09-07 ENCOUNTER — Other Ambulatory Visit (HOSPITAL_COMMUNITY): Payer: Self-pay | Admitting: Physician Assistant

## 2023-09-07 ENCOUNTER — Inpatient Hospital Stay: Payer: Self-pay | Attending: Hematology and Oncology | Admitting: Hematology and Oncology

## 2023-09-07 VITALS — BP 114/77 | HR 86 | Wt 153.0 lb

## 2023-09-07 DIAGNOSIS — O23593 Infection of other part of genital tract in pregnancy, third trimester: Secondary | ICD-10-CM

## 2023-09-07 DIAGNOSIS — O99013 Anemia complicating pregnancy, third trimester: Secondary | ICD-10-CM | POA: Insufficient documentation

## 2023-09-07 DIAGNOSIS — Z8659 Personal history of other mental and behavioral disorders: Secondary | ICD-10-CM

## 2023-09-07 DIAGNOSIS — Z8759 Personal history of other complications of pregnancy, childbirth and the puerperium: Secondary | ICD-10-CM

## 2023-09-07 DIAGNOSIS — O0933 Supervision of pregnancy with insufficient antenatal care, third trimester: Secondary | ICD-10-CM

## 2023-09-07 DIAGNOSIS — Z87891 Personal history of nicotine dependence: Secondary | ICD-10-CM | POA: Insufficient documentation

## 2023-09-07 DIAGNOSIS — O0990 Supervision of high risk pregnancy, unspecified, unspecified trimester: Secondary | ICD-10-CM

## 2023-09-07 DIAGNOSIS — D696 Thrombocytopenia, unspecified: Secondary | ICD-10-CM | POA: Diagnosis not present

## 2023-09-07 DIAGNOSIS — Z3A31 31 weeks gestation of pregnancy: Secondary | ICD-10-CM | POA: Diagnosis not present

## 2023-09-07 DIAGNOSIS — D509 Iron deficiency anemia, unspecified: Secondary | ICD-10-CM | POA: Insufficient documentation

## 2023-09-07 DIAGNOSIS — O0993 Supervision of high risk pregnancy, unspecified, third trimester: Secondary | ICD-10-CM

## 2023-09-07 DIAGNOSIS — O09522 Supervision of elderly multigravida, second trimester: Secondary | ICD-10-CM

## 2023-09-07 DIAGNOSIS — A5901 Trichomonal vulvovaginitis: Secondary | ICD-10-CM

## 2023-09-07 DIAGNOSIS — O09523 Supervision of elderly multigravida, third trimester: Secondary | ICD-10-CM

## 2023-09-07 DIAGNOSIS — O0932 Supervision of pregnancy with insufficient antenatal care, second trimester: Secondary | ICD-10-CM

## 2023-09-07 DIAGNOSIS — Z3009 Encounter for other general counseling and advice on contraception: Secondary | ICD-10-CM
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
## 2023-09-07 NOTE — Progress Notes (Signed)
 PRENATAL VISIT NOTE  Subjective:  [name] [name] is a 38 y.o. G3P2002 at [redacted]w[redacted]d being seen today for ongoing prenatal care.  She is currently monitored for the following issues for this high-risk pregnancy and has Thrombocytopenia, unspecified (HCC); History of prior pregnancy with IUGR newborn; Anemia of pregnancy; AMA (advanced maternal age) multigravida 35+, second trimester; Supervision of high risk pregnancy, antepartum; History of postpartum depression; Trichomonal vaginitis in pregnancy in second trimester; Late prenatal care affecting pregnancy in second trimester; Supervision of high risk pregnancy due to social problems in third trimester; and Low vitamin D  level on their problem list.  Patient reports fatigue.  Contractions: Not present. Vag. Bleeding: None.  Movement: Present. Denies leaking of fluid.   The following portions of the patient's history were reviewed and updated as appropriate: allergies, current medications, past family history, past medical history, past social history, past surgical history and problem list.   Objective:    Vitals:   09/07/23 1419  BP: 114/77  Pulse: 86  Weight: 153 lb (69.4 kg)    Fetal Status:  Fetal Heart Rate (bpm): 138   Movement: Present    General: Alert, oriented and cooperative. Patient is in no acute distress.  Skin: Skin is warm and dry. No rash noted.   Cardiovascular: Normal heart rate noted  Respiratory: Normal respiratory effort, no problems with respiration noted  Abdomen: Soft, gravid, appropriate for gestational age.  Pain/Pressure: Present (pressure)     Pelvic: Cervical exam deferred        Extremities: Normal range of motion.  Edema: Trace (R ankle)  Mental Status: Normal mood and affect. Normal behavior. Normal judgment and thought content.   Assessment and Plan:  Pregnancy: G3P2002 at [redacted]w[redacted]d  1. Trichomonal vaginitis in pregnancy in second trimester (Primary) TOC next visit  2. Thrombocytopenia, unspecified  (HCC) Recheck today  3. History of prior pregnancy with IUGR newborn Follow up growth 36 weeks  4. Anemia during pregnancy in third trimester IV iron  transfusion today  5. Supervision of high risk pregnancy, antepartum Seen today with her case worker Raguel, permission given to discuss in front of her  6. AMA (advanced maternal age) multigravida 35+, second trimester  7. Late prenatal care affecting pregnancy in second trimester  8. History of postpartum depression  9. Unwanted fertility State BTL papers signed today. Patient verbalizes understanding that postpartum tubal ligation is a permanent and irreversible procedure. She affirms understanding that she will not be able to have children after it is done and affirms her desire to proceed with signing papers.   Preterm labor symptoms and general obstetric precautions including but not limited to vaginal bleeding, contractions, leaking of fluid and fetal movement were reviewed in detail with the patient. Please refer to After Visit Summary for other counseling recommendations.   Return in about 2 weeks (around 09/21/2023) for high OB.  Future Appointments  Date Time Provider Department Center  09/07/2023  3:00 PM Loretha Ash, MD [facility] None  09/07/2023  3:45 PM [facility] None  09/09/2023  8:45 AM [facility]  09/22/2023  3:55 PM Ilean Norleen GAILS, MD [facility]  10/06/2023  3:15 PM Delores Nidia LITTIE, FNP [facility]  10/12/2023  8:15 AM Regino Camie LABOR, CNM [facility]  10/18/2023  2:35 PM Zina Jerilynn LABOR, MD [facility]  10/25/2023  1:15 PM Delores Nidia LITTIE, FNP [facility]  11/01/2023  1:55 PM Ilean Norleen GAILS, MD [facility]  11/08/2023 10:55 AM  Eldonna Suzen Octave, MD [facility]    Burnard CHRISTELLA Moats, MD

## 2023-09-08 ENCOUNTER — Telehealth: Payer: Self-pay

## 2023-09-08 ENCOUNTER — Other Ambulatory Visit (HOSPITAL_COMMUNITY): Payer: Self-pay | Admitting: Physician Assistant

## 2023-09-08 LAB — CBC
Hematocrit: 27.9 % — ABNORMAL LOW (ref 34.0–46.6)
Hemoglobin: 8.6 g/dL — ABNORMAL LOW (ref 11.1–15.9)
MCH: 27.1 pg (ref 26.6–33.0)
MCHC: 30.8 g/dL — ABNORMAL LOW (ref 31.5–35.7)
MCV: 88 fL (ref 79–97)
Platelets: 108 x10E3/uL — ABNORMAL LOW (ref 150–450)
RBC: 3.17 x10E6/uL — ABNORMAL LOW (ref 3.77–5.28)
RDW: 15.8 % — ABNORMAL HIGH (ref 11.7–15.4)
WBC: 10.2 x10E3/uL (ref 3.4–10.8)

## 2023-09-08 NOTE — BH Specialist Note (Unsigned)
 Integrated Behavioral Health via Telemedicine Visit  09/09/2023 [name] [name] [id number]  Number of Integrated Behavioral Health Clinician visits: 1- Initial Visit  Session Start time: [phone]   Session End time: 0953  Total time in minutes: 61    Referring Provider: Norleen Rover, MD Patient/Family location: Home  [facility] Provider location: [facility] for [facility]  All persons participating in visit: Patient [name] [name] and [name]    Types of Service: Individual psychotherapy and Video visit  I connected with Tita LITTIE Metro and/or [name]'s n/a via  Telephone or [employer]  (Video is Caregility application) and verified that I am speaking with the correct person using two identifiers. Discussed confidentiality: Yes   I discussed the limitations of telemedicine and the availability of in person appointments.  Discussed there is a possibility of technology failure and discussed alternative modes of communication if that failure occurs.  I discussed that engaging in this telemedicine visit, they consent to the provision of behavioral healthcare and the services will be billed under their insurance.  Patient and/or legal guardian expressed understanding and consented to Telemedicine visit: Yes   Presenting Concerns: Patient and/or family reports the following symptoms/concerns: Concerned about past history of depression postpartum returning, as pt has decided to work with adoption agency to have baby adopted; pt is coping with the full support of friends, family, adoption agency ([employer]), workplace; Pt's goals are to keep baby healthy, and to prioritize the wellbeing of herself and her children.  Duration of problem: Current pregnancy; Severity of problem: moderate  Patient and/or Family's Strengths/Protective Factors: Social connections, Concrete supports in place (healthy  food, safe environments, etc.), Sense of purpose, and Physical Health (exercise, healthy diet, medication compliance, etc.)  Goals Addressed: Patient will:  Maintain reduced symptoms of: anxiety and depression through postpartum time   Demonstrate ability to: Increase healthy adjustment to current life circumstances and Increase adequate support systems for patient/family  Progress towards Goals: Ongoing    Interventions: Interventions utilized:  Supportive Reflection Standardized Assessments completed: Not Needed    Patient and/or Family Response: Patient agrees with treatment plan.   Clinical Assessment/Diagnosis  Adjustment disorder with mixed anxiety and depressed mood.   Patient may benefit from psychoeducation and brief therapeutic interventions regarding maintaining reduced symptoms of anxiety and depression.  Plan: Follow up with behavioral health clinician on : One month; Call [name] at [phone], as needed. Behavioral recommendations:  -Continue prioritizing healthy self-care (regular meals, adequate rest; allowing practical help from supportive friends and family); spending quality time with children -Consider birth mom support group as needed at [email or web address].net  -Continue plan to schedule iron  infusion  -Continue working with [employer] and case workers for a partially open adoption  Referral(s): Integrated Art gallery manager (In Clinic) and [employer]:  birth mom support group  I discussed the assessment and treatment plan with the patient and/or parent/guardian. They were provided an opportunity to ask questions and all were answered. They agreed with the plan and demonstrated an understanding of the instructions.   They were advised to call back or seek an in-person evaluation if the symptoms worsen or if the condition fails to improve as anticipated.  [name], LCSW     09/07/2023    3:40 PM 08/02/2023    9:56 AM  08/29/2020   11:56 AM 07/04/2020    5:10 PM 06/11/2020    4:55 PM  Depression screen PHQ 2/9  Decreased Interest 0 0 0 0 0  Down, Depressed, Hopeless 0 0 0 0 0  PHQ - 2 Score 0 0 0 0 0  Altered sleeping  0 0 0 0  Tired, decreased energy  0 0 0 0  Change in appetite  0 0 0 0  Feeling bad or failure about yourself   0 0 0 0  Trouble concentrating  0 0 1 0  Moving slowly or fidgety/restless  0 0 0 0  Suicidal thoughts  0 0 0 0  PHQ-9 Score  0 0 1 0  Difficult doing work/chores   Not difficult at all        08/02/2023    9:57 AM 08/29/2020   11:57 AM 07/04/2020    5:11 PM 06/11/2020    4:55 PM  GAD 7 : Generalized Anxiety Score  Nervous, Anxious, on Edge 0 0 0 1  Control/stop worrying 0 0 0 0  Worry too much - different things 0 0 0 0  Trouble relaxing 0 0 0 1  Restless 0 0 0 0  Easily annoyed or irritable 0 0 1 1  Afraid - awful might happen 0 0 0 0  Total GAD 7 Score 0 0 1 3

## 2023-09-08 NOTE — Progress Notes (Signed)
 Patient signed BTL form. Patient reports she applied for Medicaid and is awaiting approval. BTL form sent to filing cabinet.   Rosaline, RN 09/08/23

## 2023-09-08 NOTE — Telephone Encounter (Signed)
 Called pt to find out her availability for iron  infusions to be performed at [facility] d/t pregnancy. She states she is available 8/28 and has to work 11AM-7PM Friday. Advised pt that nursing staff assists in the initial visit by calling [phone] to schedule. Attempted to call this number x3 and reached voicemail. Left detailed message with pt details for them to call and schedule pt. She is needing an appt ASAP d/t her current condition. Made pt aware that I left a message. This message forwarded to MD and covering nurse for 8/28.

## 2023-09-09 ENCOUNTER — Telehealth (HOSPITAL_COMMUNITY): Payer: Self-pay | Admitting: Pharmacy Technician

## 2023-09-09 ENCOUNTER — Encounter (HOSPITAL_COMMUNITY): Payer: Self-pay | Admitting: Hematology and Oncology

## 2023-09-09 ENCOUNTER — Ambulatory Visit: Payer: Self-pay

## 2023-09-09 DIAGNOSIS — F4323 Adjustment disorder with mixed anxiety and depressed mood: Secondary | ICD-10-CM

## 2023-09-09 NOTE — Telephone Encounter (Signed)
 Called [facility] to get [name] [name] scheduled for iron  infusion. They will call her to get her scheduled for 9/4 at 1100 if she can come at that time. Scheduler at [facility] will call the patient to schedule her. Andrea CHRISTELLA Plunk, RN

## 2023-09-09 NOTE — Telephone Encounter (Signed)
 Auth Submission: NO AUTH NEEDED Site of care: [facility] Payer: [place] [employer] Medication & CPT/J Code(s) submitted: Venofer  (Iron  Sucrose) J1756 Diagnosis Code:  Route of submission (phone, fax, portal):  Phone # Fax # Auth type: Buy/Bill HB Units/visits requested: 200mg  x 5 doses Reference number:  Approval from: 09/09/23 to 12/10/23      Dagoberto Armour, CPhT [facility] Phone: [phone] 09/09/2023

## 2023-09-09 NOTE — Patient Instructions (Signed)
 Center for [facility] Healthcare at [facility] [address] [place], [place] [address] [phone] (main office) [phone] (Chiquitta Matty's office)  Birth Mom Support Groups: [email or web address].net

## 2023-09-14 ENCOUNTER — Ambulatory Visit: Payer: Self-pay | Admitting: Obstetrics and Gynecology

## 2023-09-14 DIAGNOSIS — D696 Thrombocytopenia, unspecified: Secondary | ICD-10-CM

## 2023-09-16 ENCOUNTER — Inpatient Hospital Stay (HOSPITAL_COMMUNITY)
Admission: RE | Admit: 2023-09-16 | Discharge: 2023-09-16 | Disposition: A | Discharge status: Home / Self Care | Payer: Self-pay | Source: Ambulatory Visit | Attending: Hematology and Oncology

## 2023-09-16 DIAGNOSIS — D509 Iron deficiency anemia, unspecified: Secondary | ICD-10-CM | POA: Insufficient documentation

## 2023-09-16 DIAGNOSIS — Z3A31 31 weeks gestation of pregnancy: Secondary | ICD-10-CM | POA: Diagnosis not present

## 2023-09-16 DIAGNOSIS — O99013 Anemia complicating pregnancy, third trimester: Secondary | ICD-10-CM | POA: Diagnosis present

## 2023-09-16 MED ORDER — IRON SUCROSE 200 MG IVPB - SIMPLE MED
200.0000 mg | Status: DC
  Administered 2023-09-16: 200 mg via INTRAVENOUS
  Filled 2023-09-16: qty 200

## 2023-09-20 ENCOUNTER — Encounter (HOSPITAL_COMMUNITY)
Admission: RE | Admit: 2023-09-20 | Discharge: 2023-09-20 | Disposition: A | Discharge status: Home / Self Care | Payer: Self-pay | Source: Ambulatory Visit | Attending: Hematology and Oncology | Admitting: Hematology and Oncology

## 2023-09-20 VITALS — BP 108/57 | HR 80 | Temp 98.0°F | Resp 16

## 2023-09-20 DIAGNOSIS — D509 Iron deficiency anemia, unspecified: Secondary | ICD-10-CM

## 2023-09-20 DIAGNOSIS — O99013 Anemia complicating pregnancy, third trimester: Secondary | ICD-10-CM

## 2023-09-20 MED ORDER — IRON SUCROSE 200 MG IVPB - SIMPLE MED
200.0000 mg | Freq: Once | Status: AC
  Administered 2023-09-20: 200 mg via INTRAVENOUS
  Filled 2023-09-20: qty 200

## 2023-09-22 ENCOUNTER — Inpatient Hospital Stay (HOSPITAL_COMMUNITY): Admission: RE | Admit: 2023-09-22 | Payer: Self-pay | Source: Ambulatory Visit

## 2023-09-22 ENCOUNTER — Encounter (HOSPITAL_COMMUNITY): Payer: Self-pay | Admitting: Hematology and Oncology

## 2023-09-22 ENCOUNTER — Ambulatory Visit (HOSPITAL_COMMUNITY)
Admission: RE | Admit: 2023-09-22 | Discharge: 2023-09-22 | Disposition: A | Discharge status: Home / Self Care | Payer: Self-pay | Source: Ambulatory Visit | Attending: Hematology and Oncology | Admitting: Hematology and Oncology

## 2023-09-22 ENCOUNTER — Other Ambulatory Visit: Payer: Self-pay

## 2023-09-22 ENCOUNTER — Other Ambulatory Visit (HOSPITAL_COMMUNITY)
Admission: RE | Admit: 2023-09-22 | Discharge: 2023-09-22 | Disposition: A | Discharge status: Home / Self Care | Source: Ambulatory Visit | Attending: Family Medicine | Admitting: Family Medicine

## 2023-09-22 ENCOUNTER — Ambulatory Visit (INDEPENDENT_AMBULATORY_CARE_PROVIDER_SITE_OTHER): Payer: Self-pay | Admitting: Family Medicine

## 2023-09-22 VITALS — BP 117/85 | HR 67 | Temp 98.0°F | Resp 16

## 2023-09-22 VITALS — BP 116/75 | HR 80 | Wt 156.0 lb

## 2023-09-22 DIAGNOSIS — D509 Iron deficiency anemia, unspecified: Secondary | ICD-10-CM

## 2023-09-22 DIAGNOSIS — A5901 Trichomonal vulvovaginitis: Secondary | ICD-10-CM | POA: Diagnosis not present

## 2023-09-22 DIAGNOSIS — Z3A34 34 weeks gestation of pregnancy: Secondary | ICD-10-CM

## 2023-09-22 DIAGNOSIS — O99013 Anemia complicating pregnancy, third trimester: Secondary | ICD-10-CM | POA: Diagnosis present

## 2023-09-22 DIAGNOSIS — O23592 Infection of other part of genital tract in pregnancy, second trimester: Secondary | ICD-10-CM | POA: Diagnosis present

## 2023-09-22 DIAGNOSIS — Z8759 Personal history of other complications of pregnancy, childbirth and the puerperium: Secondary | ICD-10-CM

## 2023-09-22 DIAGNOSIS — Z8659 Personal history of other mental and behavioral disorders: Secondary | ICD-10-CM

## 2023-09-22 DIAGNOSIS — O0990 Supervision of high risk pregnancy, unspecified, unspecified trimester: Secondary | ICD-10-CM

## 2023-09-22 DIAGNOSIS — O0932 Supervision of pregnancy with insufficient antenatal care, second trimester: Secondary | ICD-10-CM

## 2023-09-22 DIAGNOSIS — Z3009 Encounter for other general counseling and advice on contraception: Secondary | ICD-10-CM

## 2023-09-22 DIAGNOSIS — O09523 Supervision of elderly multigravida, third trimester: Secondary | ICD-10-CM

## 2023-09-22 DIAGNOSIS — D696 Thrombocytopenia, unspecified: Secondary | ICD-10-CM | POA: Diagnosis not present

## 2023-09-22 DIAGNOSIS — O09522 Supervision of elderly multigravida, second trimester: Secondary | ICD-10-CM

## 2023-09-22 DIAGNOSIS — O0933 Supervision of pregnancy with insufficient antenatal care, third trimester: Secondary | ICD-10-CM

## 2023-09-22 DIAGNOSIS — F4323 Adjustment disorder with mixed anxiety and depressed mood: Secondary | ICD-10-CM

## 2023-09-22 DIAGNOSIS — O23593 Infection of other part of genital tract in pregnancy, third trimester: Secondary | ICD-10-CM

## 2023-09-22 DIAGNOSIS — Z23 Encounter for immunization: Secondary | ICD-10-CM

## 2023-09-22 MED ORDER — IRON SUCROSE 200 MG IVPB - SIMPLE MED
200.0000 mg | Freq: Once | Status: AC
  Administered 2023-09-22: 200 mg via INTRAVENOUS
  Filled 2023-09-22: qty 200

## 2023-09-22 NOTE — Telephone Encounter (Signed)
 Error

## 2023-09-22 NOTE — Progress Notes (Signed)
 PRENATAL VISIT NOTE  Subjective:  [name] [name] is a 38 y.o. G3P2002 at [redacted]w[redacted]d being seen today for ongoing prenatal care.  She is currently monitored for the following issues for this high-risk pregnancy and has Thrombocytopenia, unspecified (HCC); History of prior pregnancy with IUGR newborn; Anemia of pregnancy; AMA (advanced maternal age) multigravida 35+, second trimester; Supervision of high risk pregnancy, antepartum; History of postpartum depression; Trichomonal vaginitis in pregnancy in second trimester; Late prenatal care affecting pregnancy in second trimester; Supervision of high risk pregnancy due to social problems in third trimester; Low vitamin D  level; Unwanted fertility; and IDA (iron  deficiency anemia) on their problem list.  Patient reports no bleeding, no contractions, no cramping, and no leaking.  Contractions: Not present. Vag. Bleeding: None.  Movement: Present. Denies leaking of fluid.   The following portions of the patient's history were reviewed and updated as appropriate: allergies, current medications, past family history, past medical history, past social history, past surgical history and problem list.   Objective:    Vitals:   09/22/23 1632  BP: 116/75  Pulse: 80  Weight: 156 lb (70.8 kg)    Fetal Status:  Fetal Heart Rate (bpm): 145   Movement: Present    General: Alert, oriented and cooperative. Patient is in no acute distress.  Skin: Skin is warm and dry. No rash noted.   Cardiovascular: Normal heart rate noted  Respiratory: Normal respiratory effort, no problems with respiration noted  Abdomen: Soft, gravid, appropriate for gestational age.  Pain/Pressure: Present     Pelvic: Cervical exam deferred        Extremities: Normal range of motion.  Edema: Trace  Mental Status: Normal mood and affect. Normal behavior. Normal judgment and thought content.   Assessment and Plan:  Pregnancy: G3P2002 at [redacted]w[redacted]d 1. Trichomonal vaginitis in pregnancy in  second trimester (Primary) Wet prep collected today for TOC - Cervicovaginal ancillary only( [place])  2. Supervision of high risk pregnancy, antepartum FHR BP appropriate today - Tdap vaccine greater than or equal to 7yo IM  3. Adjustment disorder with mixed anxiety and depressed mood Following with behavioral health  4. Thrombocytopenia, unspecified (HCC) Last platelets 108  5. History of prior pregnancy with IUGR newborn Growth scan scheduled for 36-week  6. Anemia during pregnancy in third trimester Currently having IV iron  infusion  7. AMA (advanced maternal age) multigravida 35+, second trimester   8. Unwanted fertility BTL papers signed on 8/26  9. History of postpartum depression  10. Late prenatal care affecting pregnancy in second trimester  11. Iron  deficiency anemia of pregnancy Currently receiving IV iron  infusions.  Has received 1, scheduled for 2 additional infusions 12. [redacted] weeks gestation of pregnancy   Preterm labor symptoms and general obstetric precautions including but not limited to vaginal bleeding, contractions, leaking of fluid and fetal movement were reviewed in detail with the patient. Please refer to After Visit Summary for other counseling recommendations.   No follow-ups on file.  Future Appointments  Date Time Provider Department Center  09/24/2023  9:00 AM [facility]11 [facility] None  09/27/2023 10:00 AM [facility]10 [facility] None  10/06/2023 10:45 AM [facility] CLINIC[facility]  10/06/2023  3:15 PM [name] Nidia LITTIE, FNP [facility]  10/07/2023  7:15 AM [facility] PROVIDER 1 [facility]  10/07/2023  7:30 AM [facility] US5 [facility]  10/12/2023  8:15 AM [name] [name] [facility]  10/18/2023  2:35 PM [name] Jerilynn DELENA, MD [facility]  10/25/2023  1:15 PM  [name] Nidia CROME, FNP [facility]  11/01/2023  1:55 PM [name] [name] GAILS, MD [facility]  11/08/2023 10:55 AM [name] Suzen Octave, MD [facility]  03/07/2024 10:30 AM  [facility] LAB [facility] None  03/07/2024 11:00 AM Loretha Ash, MD [facility] None    [name] [name] Ilean, MD

## 2023-09-24 ENCOUNTER — Encounter (HOSPITAL_COMMUNITY)
Admission: RE | Admit: 2023-09-24 | Discharge: 2023-09-24 | Disposition: A | Discharge status: Home / Self Care | Payer: Self-pay | Source: Ambulatory Visit | Attending: Hematology and Oncology | Admitting: Hematology and Oncology

## 2023-09-24 ENCOUNTER — Encounter (HOSPITAL_COMMUNITY): Payer: Self-pay

## 2023-09-24 VITALS — BP 109/70 | HR 62 | Temp 97.5°F | Resp 16

## 2023-09-24 DIAGNOSIS — D509 Iron deficiency anemia, unspecified: Secondary | ICD-10-CM

## 2023-09-24 DIAGNOSIS — O99013 Anemia complicating pregnancy, third trimester: Secondary | ICD-10-CM | POA: Diagnosis not present

## 2023-09-24 LAB — CERVICOVAGINAL ANCILLARY ONLY
Chlamydia: NEGATIVE
Comment: NEGATIVE
Comment: NEGATIVE
Comment: NORMAL
Neisseria Gonorrhea: NEGATIVE
Trichomonas: NEGATIVE

## 2023-09-24 MED ORDER — IRON SUCROSE 200 MG IVPB - SIMPLE MED
200.0000 mg | Freq: Once | Status: AC
  Administered 2023-09-24: 200 mg via INTRAVENOUS
  Filled 2023-09-24: qty 200

## 2023-09-27 ENCOUNTER — Encounter (HOSPITAL_COMMUNITY)
Admission: RE | Admit: 2023-09-27 | Discharge: 2023-09-27 | Disposition: A | Discharge status: Home / Self Care | Payer: Self-pay | Source: Ambulatory Visit | Attending: Hematology and Oncology | Admitting: Hematology and Oncology

## 2023-09-27 ENCOUNTER — Encounter (HOSPITAL_COMMUNITY): Payer: Self-pay

## 2023-09-27 VITALS — BP 105/78 | HR 80 | Temp 97.9°F | Resp 16

## 2023-09-27 DIAGNOSIS — D509 Iron deficiency anemia, unspecified: Secondary | ICD-10-CM

## 2023-09-27 DIAGNOSIS — O99013 Anemia complicating pregnancy, third trimester: Secondary | ICD-10-CM | POA: Diagnosis not present

## 2023-09-27 MED ORDER — IRON SUCROSE 200 MG IVPB - SIMPLE MED
200.0000 mg | Freq: Once | Status: AC
  Administered 2023-09-27: 200 mg via INTRAVENOUS
  Filled 2023-09-27: qty 200

## 2023-10-06 ENCOUNTER — Other Ambulatory Visit (HOSPITAL_COMMUNITY)
Admission: RE | Admit: 2023-10-06 | Discharge: 2023-10-06 | Disposition: A | Discharge status: Home / Self Care | Source: Ambulatory Visit | Attending: Obstetrics and Gynecology | Admitting: Obstetrics and Gynecology

## 2023-10-06 ENCOUNTER — Other Ambulatory Visit: Payer: Self-pay

## 2023-10-06 ENCOUNTER — Ambulatory Visit: Admitting: Clinical

## 2023-10-06 ENCOUNTER — Ambulatory Visit (INDEPENDENT_AMBULATORY_CARE_PROVIDER_SITE_OTHER): Payer: Self-pay | Admitting: Obstetrics and Gynecology

## 2023-10-06 VITALS — BP 121/71 | HR 70 | Wt 159.1 lb

## 2023-10-06 DIAGNOSIS — O99013 Anemia complicating pregnancy, third trimester: Secondary | ICD-10-CM | POA: Diagnosis not present

## 2023-10-06 DIAGNOSIS — O0933 Supervision of pregnancy with insufficient antenatal care, third trimester: Secondary | ICD-10-CM

## 2023-10-06 DIAGNOSIS — Z8759 Personal history of other complications of pregnancy, childbirth and the puerperium: Secondary | ICD-10-CM

## 2023-10-06 DIAGNOSIS — D696 Thrombocytopenia, unspecified: Secondary | ICD-10-CM

## 2023-10-06 DIAGNOSIS — Z91199 Patient's noncompliance with other medical treatment and regimen due to unspecified reason: Secondary | ICD-10-CM

## 2023-10-06 DIAGNOSIS — Z3A36 36 weeks gestation of pregnancy: Secondary | ICD-10-CM

## 2023-10-06 DIAGNOSIS — F4323 Adjustment disorder with mixed anxiety and depressed mood: Secondary | ICD-10-CM

## 2023-10-06 DIAGNOSIS — O0990 Supervision of high risk pregnancy, unspecified, unspecified trimester: Secondary | ICD-10-CM | POA: Insufficient documentation

## 2023-10-06 DIAGNOSIS — O0932 Supervision of pregnancy with insufficient antenatal care, second trimester: Secondary | ICD-10-CM

## 2023-10-06 DIAGNOSIS — Z3009 Encounter for other general counseling and advice on contraception: Secondary | ICD-10-CM

## 2023-10-06 DIAGNOSIS — O0993 Supervision of high risk pregnancy, unspecified, third trimester: Secondary | ICD-10-CM | POA: Diagnosis not present

## 2023-10-06 DIAGNOSIS — Z8659 Personal history of other mental and behavioral disorders: Secondary | ICD-10-CM

## 2023-10-06 NOTE — Progress Notes (Addendum)
   PRENATAL VISIT NOTE  Subjective:  [name] [name] is a 38 y.o. G3P2002 at [redacted]w[redacted]d being seen today for ongoing prenatal care.  She is currently monitored for the following issues for this high-risk pregnancy and has Thrombocytopenia, unspecified (HCC); History of prior pregnancy with IUGR newborn; Anemia of pregnancy; AMA (advanced maternal age) multigravida 35+, second trimester; Supervision of high risk pregnancy, antepartum; History of postpartum depression; Trichomonal vaginitis in pregnancy in second trimester; Late prenatal care affecting pregnancy in second trimester; Supervision of high risk pregnancy due to social problems in third trimester; Low vitamin D  level; Unwanted fertility; and IDA (iron  deficiency anemia) on their problem list.  Patient reports fatigue.  Contractions: Not present. Vag. Bleeding: None.  Movement: Present. Denies leaking of fluid.   The following portions of the patient's history were reviewed and updated as appropriate: allergies, current medications, past family history, past medical history, past social history, past surgical history and problem list.   Objective:    Vitals:   10/06/23 1516  BP: 121/71  Pulse: 70  Weight: 159 lb 1.6 oz (72.2 kg)    Fetal Status:  Fetal Heart Rate (bpm): 130   Movement: Present Presentation: Vertex  General: Alert, oriented and cooperative. Patient is in no acute distress.  Skin: Skin is warm and dry. No rash noted.   Cardiovascular: Normal heart rate noted  Respiratory: Normal respiratory effort, no problems with respiration noted  Abdomen: Soft, gravid, appropriate for gestational age.  Pain/Pressure: Present     Pelvic: Cervical exam deferred        Extremities: Normal range of motion.  Edema: None  Mental Status: Normal mood and affect. Normal behavior. Normal judgment and thought content.   Assessment and Plan:  Pregnancy: G3P2002 at [redacted]w[redacted]d 1. Supervision of high risk pregnancy, antepartum (Primary) BP and  FHR normal Doing well, feeling regular movement  Case worker present for visit today  2. History of postpartum depression 3. Adjustment disorder with mixed anxiety and depressed mood Following with jamie, rescheduling todays appt Discussed other resources if needed now or after delivery  4. Anemia during pregnancy in third trimester Following hematology, has completed iron  infusion, just finished dose last week Will repeat   5. Unwanted fertility BTL consent signed previously and in media  6. Late prenatal care affecting pregnancy in second trimester   7. [redacted] weeks gestation of pregnancy Swabs today Maternity belt provided today   8. History of prior pregnancy with IUGR newborn Ultrasound tomorrow  9. Thrombocytopenia, unspecified Check next visit with cbc   Preterm labor symptoms and general obstetric precautions including but not limited to vaginal bleeding, contractions, leaking of fluid and fetal movement were reviewed in detail with the patient. Please refer to After Visit Summary for other counseling recommendations.    Future Appointments  Date Time Provider Department Center  10/07/2023  7:15 AM [facility] PROVIDER 1 [facility]  10/07/2023  7:30 AM [facility] US5 [facility]  10/12/2023  2:35 PM Nicholaus Burnard HERO, MD [facility]  10/18/2023  2:35 PM Zina Jerilynn LABOR, MD [facility]  10/20/2023  8:15 AM [facility] HEALTH CLINICIAN [facility]  10/25/2023  1:15 PM Delores Nidia LITTIE, FNP [facility]  11/01/2023  1:55 PM Ilean Norleen GAILS, MD [facility]  11/08/2023 10:55 AM Wallace Joesph LABOR, PA [facility]  03/07/2024 10:30 AM [facility] LAB [facility] None  03/07/2024 11:00 AM Loretha Ash, MD [facility] None    Nidia Delores, FNP

## 2023-10-06 NOTE — BH Specialist Note (Signed)
 Pt did not arrive to video visit and did not answer the phone; Left HIPPA-compliant message to call back Warren from [employer] for [employer] at [facility] at  [phone] [facility] office).  ?; left MyChart message for patient.  ? ?

## 2023-10-07 ENCOUNTER — Ambulatory Visit: Payer: Self-pay | Attending: Obstetrics and Gynecology | Admitting: Obstetrics and Gynecology

## 2023-10-07 ENCOUNTER — Ambulatory Visit: Payer: Self-pay

## 2023-10-07 ENCOUNTER — Other Ambulatory Visit: Payer: Self-pay | Admitting: Obstetrics and Gynecology

## 2023-10-07 ENCOUNTER — Encounter (HOSPITAL_COMMUNITY): Payer: Self-pay | Admitting: *Deleted

## 2023-10-07 ENCOUNTER — Other Ambulatory Visit: Payer: Self-pay

## 2023-10-07 ENCOUNTER — Telehealth (HOSPITAL_COMMUNITY): Payer: Self-pay | Admitting: *Deleted

## 2023-10-07 ENCOUNTER — Encounter (HOSPITAL_COMMUNITY): Payer: Self-pay | Admitting: Obstetrics and Gynecology

## 2023-10-07 ENCOUNTER — Ambulatory Visit

## 2023-10-07 ENCOUNTER — Inpatient Hospital Stay (HOSPITAL_COMMUNITY)
Admission: AD | Admit: 2023-10-07 | Discharge: 2023-10-07 | Disposition: A | Discharge status: Home / Self Care | Attending: Obstetrics and Gynecology | Admitting: Obstetrics and Gynecology

## 2023-10-07 ENCOUNTER — Encounter: Payer: Self-pay | Admitting: Obstetrics and Gynecology

## 2023-10-07 VITALS — BP 111/64 | HR 79

## 2023-10-07 DIAGNOSIS — Y9301 Activity, walking, marching and hiking: Secondary | ICD-10-CM | POA: Insufficient documentation

## 2023-10-07 DIAGNOSIS — O09523 Supervision of elderly multigravida, third trimester: Secondary | ICD-10-CM

## 2023-10-07 DIAGNOSIS — Z8759 Personal history of other complications of pregnancy, childbirth and the puerperium: Secondary | ICD-10-CM

## 2023-10-07 DIAGNOSIS — O9A213 Injury, poisoning and certain other consequences of external causes complicating pregnancy, third trimester: Secondary | ICD-10-CM | POA: Diagnosis present

## 2023-10-07 DIAGNOSIS — W010XXA Fall on same level from slipping, tripping and stumbling without subsequent striking against object, initial encounter: Secondary | ICD-10-CM | POA: Diagnosis not present

## 2023-10-07 DIAGNOSIS — O0990 Supervision of high risk pregnancy, unspecified, unspecified trimester: Secondary | ICD-10-CM

## 2023-10-07 DIAGNOSIS — Z3A36 36 weeks gestation of pregnancy: Secondary | ICD-10-CM

## 2023-10-07 DIAGNOSIS — O365930 Maternal care for other known or suspected poor fetal growth, third trimester, not applicable or unspecified: Secondary | ICD-10-CM | POA: Insufficient documentation

## 2023-10-07 DIAGNOSIS — O09522 Supervision of elderly multigravida, second trimester: Secondary | ICD-10-CM | POA: Diagnosis not present

## 2023-10-07 DIAGNOSIS — O99113 Other diseases of the blood and blood-forming organs and certain disorders involving the immune mechanism complicating pregnancy, third trimester: Secondary | ICD-10-CM | POA: Diagnosis not present

## 2023-10-07 DIAGNOSIS — W19XXXA Unspecified fall, initial encounter: Secondary | ICD-10-CM

## 2023-10-07 DIAGNOSIS — D696 Thrombocytopenia, unspecified: Secondary | ICD-10-CM

## 2023-10-07 DIAGNOSIS — O0933 Supervision of pregnancy with insufficient antenatal care, third trimester: Secondary | ICD-10-CM | POA: Diagnosis not present

## 2023-10-07 DIAGNOSIS — Y92512 Supermarket, store or market as the place of occurrence of the external cause: Secondary | ICD-10-CM | POA: Insufficient documentation

## 2023-10-07 DIAGNOSIS — S8992XA Unspecified injury of left lower leg, initial encounter: Secondary | ICD-10-CM | POA: Diagnosis not present

## 2023-10-07 DIAGNOSIS — O0932 Supervision of pregnancy with insufficient antenatal care, second trimester: Secondary | ICD-10-CM

## 2023-10-07 DIAGNOSIS — O365990 Maternal care for other known or suspected poor fetal growth, unspecified trimester, not applicable or unspecified: Secondary | ICD-10-CM | POA: Insufficient documentation

## 2023-10-07 DIAGNOSIS — O09293 Supervision of pregnancy with other poor reproductive or obstetric history, third trimester: Secondary | ICD-10-CM

## 2023-10-07 MED ORDER — ACETAMINOPHEN 500 MG PO TABS
1000.0000 mg | ORAL_TABLET | Freq: Once | ORAL | Status: AC
Start: 2023-10-07 — End: 2023-10-07
  Administered 2023-10-07: 1000 mg via ORAL
  Filled 2023-10-07: qty 2

## 2023-10-07 NOTE — Progress Notes (Signed)
 Maternal-Fetal Medicine Consultation  Name: [name] [name]  MRN: [id number]  GA: [id number] [redacted]w[redacted]d   Patient is here for fetal growth assessment. History of fetal growth restriction in her first pregnancy. She does not have gestational diabetes.  Blood pressure today at our office is 111/64 mmHg. Obstetrical history is significant for 2 term vaginal deliveries.  Ultrasound On today's ultrasound, the estimated fetal weight is at the 7th percentile and the abdominal circumference measurement at the 13th percentile.  Head circumference measurement is at between -1 and -2 SD.  Umbilical artery Doppler showed increased S/D ratio.  NST is reactive.  BPP 10/10.  Fetal growth restriction I explained the finding of fetal growth restriction that is difficult to differentiate from a constitutionally small for gestational age fetus. Most fetuses are small but not growth restricted. However, abnormal umbilical artery Doppler study is consistent with growth restriction.  I discussed the possible causes of fetal growth restriction including placental insufficiency (most common cause), chromosomal anomalies and fetal infections.  Patient did not give history of fever or rashes.  Patient had fetal growth restriction in her first pregnancy and both her children are alive and well. I discussed timing of delivery.  Since severe fetal growth restriction is associated with increased risk of perinatal mortality and morbidity, we recommend delivery at [redacted] weeks gestation.  Vaginal delivery can be safely attempted.  Patient agreed with my recommendations.  Sent a message to [employer], who will be scheduling IOL.  Recommendations - Delivery at [redacted] weeks gestation.   Consultation including face-to-face (more than 50%) counseling 30 minutes.

## 2023-10-07 NOTE — MAU Provider Note (Signed)
 Chief Complaint:  Fall   HPI   None     [name] [name] is a 38 y.o. G3P2002 at [redacted]w[redacted]d who presents to maternity admissions reporting falling on her knee at 1515. She reports that she was walking at the grocery store and slipped on a wet spot on the floor. She landed on her left knee and her left thigh was stretched when she landed in a split. She has had some left knee and inner thigh pain since the fall. She denies hitting her abdomen during the fall. Denies vaginal bleeding, leaking of fluid, contractions. Endorses good fetal movement including since the fall.   Pregnancy Course: Receives care at [facility] for [facility] . Prenatal records reviewed. Pregnancy complicated by anemia, AMA, IUGR. Plan is for IOL at 37 weeks, scheduled for 10/14/2023.  Past Medical History:  Diagnosis Date   Anemia    GERD (gastroesophageal reflux disease)    OTC as needed   Gonorrhea    History of trichomoniasis    Laceration of right thumb 08/16/2011   right thumb digital nerve lac.; sutures x 8, per pt.   OB History  Gravida Para Term Preterm AB Living  3 2 2   2   SAB IAB Ectopic Multiple Live Births     0 2    # Outcome Date GA Lbr Len/2nd Weight Sex Type Anes PTL Lv  3 Current           2 Term 06/11/20 [redacted]w[redacted]d 01:26 / 00:10 2954 g F Vag-Spont None  LIV  1 Term 11/25/13 [redacted]w[redacted]d / 00:04 1820 g M Vag-Spont None  LIV   Past Surgical History:  Procedure Laterality Date   NERVE REPAIR  08/31/2011   Procedure: NERVE REPAIR;  Surgeon: Franky JONELLE Curia, MD;  Location: [facility];  Service: Orthopedics;  Laterality: Right;  right thumb digital nerve repair   WISDOM TOOTH EXTRACTION     Family History  Problem Relation Age of Onset   Hypertension Mother    Glaucoma Father    Stroke Maternal Grandfather    Heart disease Maternal Grandfather    Hearing loss Neg Hx    Social History   Tobacco Use   Smoking status: Former    Current packs/day: 0.00    Types:  Cigarettes    Start date: 05/05/2009    Quit date: 05/05/2013    Years since quitting: 10.4   Smokeless tobacco: Never   Tobacco comments:    quit with pos preg  Vaping Use   Vaping status: Never Used  Substance Use Topics   Alcohol use: No   Drug use: No   Allergies  Allergen Reactions   Sulfonamide Derivatives Hives   Medications Prior to Admission  Medication Sig Dispense Refill Last Dose/Taking   Prenatal MV & Min w/FA-DHA (PRENATAL GUMMIES PO) Take 2 tablets by mouth daily.   10/07/2023   Vitamin D , Ergocalciferol , (DRISDOL ) 1.25 MG (50000 UNIT) CAPS capsule Take 1 capsule (50,000 Units total) by mouth every 7 (seven) days for 12 doses. 12 capsule 0 10/07/2023    I have reviewed patient's Past Medical Hx, Surgical Hx, Family Hx, Social Hx, medications and allergies.   ROS  Pertinent items noted in HPI and remainder of comprehensive ROS otherwise negative.   PHYSICAL EXAM  Patient Vitals for the past 24 hrs:  BP Temp Temp src Pulse Resp SpO2  10/07/23 1707 107/69 -- -- 83 -- 100 %  10/07/23 1659 105/61 97.8  F (36.6 C) Oral 82 18 100 %    Constitutional: Well-developed, well-nourished female in no acute distress.  HEENT: atraumatic, normocephalic. Neck has normal ROM. EOM intact. Cardiovascular: normal rate & rhythm, warm and well-perfused Respiratory: normal effort, no problems with respiration noted GI: Abd soft, non-tender, non-distended MSK: Extremities nontender, no edema, normal ROM Skin: warm and dry. Acyanotic, no jaundice or pallor. Neurologic: Alert and oriented x 4. No abnormal coordination. Psychiatric: Normal mood. Speech not slurred, not rapid/pressured. Patient is cooperative. GU: no CVA tenderness  Fetal Tracing: Baseline FHR: 130 per minute Fetal heart variability: moderate Fetal Heart Rate accelerations: yes Fetal Heart Rate decelerations: none Fetal Non-stress Test: Category I (reactive) Toco: uterine irritability, no regular uterine  contractions   Labs: No results found for this or any previous visit (from the past 24 hours).  Imaging:  US  MFM OB FOLLOW UP Result Date: 10/07/2023 ----------------------------------------------------------------------  OBSTETRICS REPORT                       (Signed Final 10/07/2023 09:39 am) ---------------------------------------------------------------------- Patient Info  ID #:       [id number]                          D.O.B.:  [birth year] (37 yrs)(F)  Name:       [name] [name]               Visit Date: 10/07/2023 08:30 am ---------------------------------------------------------------------- Performed By  Attending:        Fredia Fresh MD        Ref. Address:     [address]                                                             [place], [place]                                                             [address]  Performed By:     Elenor Edu BS      [facility]                                 [facility]  Referred By:      [facility] ---------------------------------------------------------------------- Orders  #  Description                           Code        Ordered By  1  US  MFM OB FOLLOW UP                   [id number]    KELLY DAVIS  2  US  MFM UA CORD DOPPLER                [id number]    KELLY DAVIS  3  US  MFM FETAL BPP                      [id number]     KELLY DAVIS     W/NONSTRESS ----------------------------------------------------------------------  #  Order #                     Accession #                Episode #  1  [id number]                   [id number]                 [id number]  2  [id number]                   [id number]                 [id number]  3  [id number]                   [id number]                 [id number]  ---------------------------------------------------------------------- Indications  [redacted] weeks gestation of pregnancy                Z3A.24  Advanced maternal age multigravida 58+,        O71.522  third trimester (37 years)  Insufficient Prenatal Care                     O09.30  Thrombocytopenia affecting pregnancy,          O99.119, D69.6  antepartum  Encounter for other antenatal screening        Z36.2  follow-up  Poor obstetric history: Previous fetal growth  O09.299  restriction (FGR)  Poor fetal growth, third trimester             O36.5930 ---------------------------------------------------------------------- Fetal Evaluation  Num Of Fetuses:         1  Fetal Heart Rate(bpm):  130  Cardiac Activity:       Observed  Presentation:           Cephalic  Placenta:               Posterior  P. Cord Insertion:      Not well visualized  Amniotic Fluid  AFI FV:      Subjectively upper-normal  AFI Sum(cm)     %Tile       Largest Pocket(cm)  24.94           95          8.25  RUQ(cm)       RLQ(cm)       LUQ(cm)        LLQ(cm)  6.94          5.37          8.25           4.39 ---------------------------------------------------------------------- Biophysical Evaluation  Amniotic F.V:   Pocket => 2 cm             F. Tone:        Observed  F. Movement:    Observed                   N.S.T:          Reactive  F. Breathing:   Observed                   Score:          10/10 ---------------------------------------------------------------------- Biometry  BPD:      80.4  mm     G. Age:  32w 2d        < 1  %    CI:        70.93   %    70 - 86                                                          FL/HC:      21.5   %    20.1 - 22.1  HC:      304.2  mm     G. Age:  33w 6d        < 1  %    HC/AC:      1.00        0.93 - 1.11  AC:      303.3  mm     G. Age:  34w 2d         13  %    FL/BPD:     81.5   %    71 - 87  FL:       65.5  mm     G. Age:  33w 5d        3.8  %    FL/AC:      21.6   %    20 - 24  LV:        1.6  mm  Est. FW:    2301   gm      5 lb 1 oz      7  % ---------------------------------------------------------------------- OB History  Gravidity:    3         Term:   2  Living:       2 ---------------------------------------------------------------------- Gestational Age  LMP:           36w 1d        Date:  01/27/23                  EDD:   11/03/23  U/S Today:     33w 4d                                        EDD:  11/21/23  Best:          36w 1d     Det. By:  LMP  (01/27/23)          EDD:   11/03/23 ---------------------------------------------------------------------- Anatomy  Cranium:               Appears normal         Aortic Arch:            Previously seen  Cavum:                 Previously seen        Ductal Arch:            Previously seen  Ventricles:            Appears normal         Diaphragm:              Appears normal  Choroid Plexus:        Previously seen        Stomach:                Appears normal, left                                                                        sided  Cerebellum:            Previously seen        Abdomen:                Previously seen  Posterior Fossa:       Previously seen        Abdominal Wall:         Previously seen  Nuchal Fold:           Appears normal         Cord Vessels:           Previously seen  Face:                  Orbits and profile     Kidneys:                Appear normal                         previously seen  Lips:                  Previously seen        Bladder:                Appears normal  Thoracic:              Previously seen        Spine:                  Previously seen  Heart:                 Appears normal         Upper Extremities:      Previously seen                         (4CH,  axis, and                         situs)  RVOT:                  Previously seen        Lower Extremities:      Previously seen  LVOT:                  Previously seen  Other:  Fetal anatomic survey complete on prior scans.  ---------------------------------------------------------------------- Doppler - Fetal Vessels  Umbilical Artery   S/D     %tile      RI    %tile      PI    %tile     PSV    ADFV    RDFV                                                     (cm/s)   3.79   > 97.5    0.74   > 97.5    1.33   > 97.5    56.24      No      No ---------------------------------------------------------------------- Cervix Uterus Adnexa  Cervix  Not visualized (advanced GA >24wks)  Uterus  No abnormality visualized.  Right Ovary  Within normal limits.  Left Ovary  Within normal limits.  Cul De Sac  No free fluid seen.  Adnexa  No abnormality visualized ---------------------------------------------------------------------- Impression  Patient is here for fetal growth assessment.  History of fetal growth restriction in her first pregnancy.  She does not have gestational diabetes.  Blood pressure  today at our office is 111/64 mmHg.  Obstetrical history is significant for 2 term vaginal deliveries.  Ultrasound  On today's ultrasound, the estimated fetal weight is at the 7th  percentile and the abdominal circumference measurement at  the 13th percentile.  Head circumference measurement is at  between -1 and -2 SD.  Umbilical artery Doppler showed  increased S/D ratio.  NST is reactive.  BPP 10/10.  Fetal growth restriction  I explained the finding of fetal growth restriction that is difficult  to differentiate from a constitutionally small for gestational  age fetus. Most fetuses are small but not growth restricted.  However, abnormal umbilical artery Doppler study is  consistent with growth restriction.  I discussed the possible causes of fetal growth restriction  including placental insufficiency (most common cause),  chromosomal anomalies and fetal infections.  Patient did not  give history of fever or rashes.  Patient had fetal growth restriction in her first pregnancy and  both her children are alive and well.  I discussed timing of delivery.   Since severe fetal growth  restriction is associated with increased risk of perinatal  mortality and morbidity, we recommend delivery at [redacted] weeks  gestation.  Vaginal delivery can be safely attempted.  Patient agreed with my recommendations.  Sent a message to [employer], who will be scheduling  IOL. ---------------------------------------------------------------------- Recommendations  - Delivery at [redacted] weeks gestation. ----------------------------------------------------------------------                 Fredia Fresh, MD Electronically Signed Final Report   10/07/2023 09:39 am ----------------------------------------------------------------------   US  MFM UA CORD DOPPLER Result Date: 10/07/2023 ----------------------------------------------------------------------  OBSTETRICS REPORT                       (  Signed Final 10/07/2023 09:39 am) ---------------------------------------------------------------------- Patient Info  ID #:       [id number]                          D.O.B.:  1985-02-05 (37 yrs)(F)  Name:       [name] CROME Jeter               Visit Date: 10/07/2023 08:30 am ---------------------------------------------------------------------- Performed By  Attending:        Fredia Fresh MD        Ref. Address:     [address]                                                             [place], [place]                                                             [address]  Performed By:     Elenor Edu BS      [facility]  Referred By:      [facility] ---------------------------------------------------------------------- Orders  #  Description                           Code        Ordered By  1  US  MFM OB FOLLOW UP                   76816.01     KELLY DAVIS  2  US  MFM UA CORD DOPPLER                76820.02    KELLY DAVIS  3  US  MFM FETAL BPP  23181.4     KELLY DAVIS     W/NONSTRESS ----------------------------------------------------------------------  #  Order #                     Accession #                Episode #  1  [id number]                   [id number]                 [id number]  2  [id number]                   [id number]                 [id number]  3  [id number]                   [id number]                 [id number] ---------------------------------------------------------------------- Indications  [redacted] weeks gestation of pregnancy                Z3A.32  Advanced maternal age multigravida 51+,        O11.522  third trimester (37 years)  Insufficient Prenatal Care                     O09.30  Thrombocytopenia affecting pregnancy,          O99.119, D69.6  antepartum  Encounter for other antenatal screening        Z36.2  follow-up  Poor obstetric history: Previous fetal growth  O09.299  restriction (FGR)  Poor fetal growth, third trimester             O36.5930 ---------------------------------------------------------------------- Fetal Evaluation  Num Of Fetuses:         1  Fetal Heart Rate(bpm):  130  Cardiac Activity:       Observed  Presentation:           Cephalic  Placenta:               Posterior  P. Cord Insertion:      Not well visualized  Amniotic Fluid  AFI FV:      Subjectively upper-normal  AFI Sum(cm)     %Tile       Largest Pocket(cm)  24.94           95          8.25  RUQ(cm)       RLQ(cm)       LUQ(cm)        LLQ(cm)  6.94          5.37          8.25           4.39 ---------------------------------------------------------------------- Biophysical Evaluation  Amniotic F.V:   Pocket => 2 cm             F. Tone:        Observed  F. Movement:    Observed                   N.S.T:          Reactive  F. Breathing:   Observed                   Score:          10/10  ---------------------------------------------------------------------- Biometry  BPD:      80.4  mm     G. Age:  32w 2d        < 1  %    CI:        70.93   %    70 - 86                                                          FL/HC:      21.5   %    20.1 - 22.1  HC:      304.2  mm     G. Age:  33w 6d        < 1  %    HC/AC:      1.00        0.93 - 1.11  AC:      303.3  mm     G. Age:  34w 2d         13  %    FL/BPD:     81.5   %    71 - 87  FL:       65.5  mm     G. Age:  33w 5d        3.8  %    FL/AC:      21.6   %    20 - 24  LV:        1.6  mm  Est. FW:    2301  gm      5 lb 1 oz      7  % ---------------------------------------------------------------------- OB History  Gravidity:    3         Term:   2  Living:       2 ---------------------------------------------------------------------- Gestational Age  LMP:           36w 1d        Date:  01/27/23                  EDD:   11/03/23  U/S Today:     33w 4d                                        EDD:   11/21/23  Best:          36w 1d     Det. By:  LMP  (01/27/23)          EDD:   11/03/23 ---------------------------------------------------------------------- Anatomy  Cranium:               Appears normal         Aortic Arch:            Previously seen  Cavum:                 Previously seen        Ductal Arch:            Previously seen  Ventricles:            Appears normal         Diaphragm:              Appears normal  Choroid  Plexus:        Previously seen        Stomach:                Appears normal, left                                                                        sided  Cerebellum:            Previously seen        Abdomen:                Previously seen  Posterior Fossa:       Previously seen        Abdominal Wall:         Previously seen  Nuchal Fold:           Appears normal         Cord Vessels:           Previously seen  Face:                  Orbits and profile     Kidneys:                Appear normal                         previously seen   Lips:                  Previously seen        Bladder:                Appears normal  Thoracic:              Previously seen        Spine:                  Previously seen  Heart:                 Appears normal         Upper Extremities:      Previously seen                         (4CH, axis, and                         situs)  RVOT:                  Previously seen        Lower Extremities:      Previously seen  LVOT:                  Previously seen  Other:  Fetal anatomic survey complete on prior scans. ---------------------------------------------------------------------- Doppler - Fetal Vessels  Umbilical Artery   S/D     %tile      RI    %tile      PI    %tile     PSV    ADFV    RDFV                                                     (  cm/s)   3.79   > 97.5    0.74   > 97.5    1.33   > 97.5    56.24      No      No ---------------------------------------------------------------------- Cervix Uterus Adnexa  Cervix  Not visualized (advanced GA >24wks)  Uterus  No abnormality visualized.  Right Ovary  Within normal limits.  Left Ovary  Within normal limits.  Cul De Sac  No free fluid seen.  Adnexa  No abnormality visualized ---------------------------------------------------------------------- Impression  Patient is here for fetal growth assessment.  History of fetal growth restriction in her first pregnancy.  She does not have gestational diabetes.  Blood pressure  today at our office is 111/64 mmHg.  Obstetrical history is significant for 2 term vaginal deliveries.  Ultrasound  On today's ultrasound, the estimated fetal weight is at the 7th  percentile and the abdominal circumference measurement at  the 13th percentile.  Head circumference measurement is at  between -1 and -2 SD.  Umbilical artery Doppler showed  increased S/D ratio.  NST is reactive.  BPP 10/10.  Fetal growth restriction  I explained the finding of fetal growth restriction that is difficult  to differentiate from a constitutionally small for  gestational  age fetus. Most fetuses are small but not growth restricted.  However, abnormal umbilical artery Doppler study is  consistent with growth restriction.  I discussed the possible causes of fetal growth restriction  including placental insufficiency (most common cause),  chromosomal anomalies and fetal infections.  Patient did not  give history of fever or rashes.  Patient had fetal growth restriction in her first pregnancy and  both her children are alive and well.  I discussed timing of delivery.  Since severe fetal growth  restriction is associated with increased risk of perinatal  mortality and morbidity, we recommend delivery at [redacted] weeks  gestation.  Vaginal delivery can be safely attempted.  Patient agreed with my recommendations.  Sent a message to [employer], who will be scheduling  IOL. ---------------------------------------------------------------------- Recommendations  - Delivery at [redacted] weeks gestation. ----------------------------------------------------------------------                 Fredia Fresh, MD Electronically Signed Final Report   10/07/2023 09:39 am ----------------------------------------------------------------------   US  MFM FETAL BPP W/NONSTRESS Result Date: 10/07/2023 ----------------------------------------------------------------------  OBSTETRICS REPORT                       (Signed Final 10/07/2023 09:39 am) ---------------------------------------------------------------------- Patient Info  ID #:       [id number]                          D.O.B.:  1985-05-28 (37 yrs)(F)  Name:       [name] CROME Wortmann               Visit Date: 10/07/2023 08:30 am ---------------------------------------------------------------------- Performed By  Attending:        Fredia Fresh MD        Ref. Address:     [address]                                                             [place], [place]  [address]  Performed By:     Elenor Edu BS      Location[facility]                    RD[facility] RVT                                 [facility]  Referred By:      [facility] ---------------------------------------------------------------------- Orders  #  Description                           Code        Ordered By  1  US  MFM OB FOLLOW UP                   [id number]    KELLY DAVIS  2  US  MFM UA CORD DOPPLER                [id number]    KELLY DAVIS  3  US  MFM FETAL BPP                      23181.4     KELLY DAVIS     W/NONSTRESS ----------------------------------------------------------------------  #  Order #                     Accession #                Episode #  1  [id number]                   [id number]                 [id number]  2  [id number]                   [id number]                 [id number]  3  [id number]                   [id number]                 [id number] ---------------------------------------------------------------------- Indications  [redacted] weeks gestation of pregnancy                Z3A.18  Advanced maternal age multigravida 39+,        O64.522  third trimester (37 years)  Insufficient Prenatal Care  O09.30  Thrombocytopenia affecting pregnancy,          O99.119, D69.6  antepartum  Encounter for other antenatal screening        Z36.2  follow-up  Poor obstetric history: Previous fetal growth  O09.299  restriction (FGR)  Poor fetal growth, third trimester             O36.5930 ---------------------------------------------------------------------- Fetal Evaluation  Num Of Fetuses:         1  Fetal Heart Rate(bpm):  130  Cardiac Activity:       Observed  Presentation:           Cephalic  Placenta:               Posterior  P. Cord Insertion:      Not well visualized  Amniotic Fluid  AFI FV:      Subjectively upper-normal  AFI Sum(cm)      %Tile       Largest Pocket(cm)  24.94           95          8.25  RUQ(cm)       RLQ(cm)       LUQ(cm)        LLQ(cm)  6.94          5.37          8.25           4.39 ---------------------------------------------------------------------- Biophysical Evaluation  Amniotic F.V:   Pocket => 2 cm             F. Tone:        Observed  F. Movement:    Observed                   N.S.T:          Reactive  F. Breathing:   Observed                   Score:          10/10 ---------------------------------------------------------------------- Biometry  BPD:      80.4  mm     G. Age:  32w 2d        < 1  %    CI:        70.93   %    70 - 86                                                          FL/HC:      21.5   %    20.1 - 22.1  HC:      304.2  mm     G. Age:  33w 6d        < 1  %    HC/AC:      1.00        0.93 - 1.11  AC:      303.3  mm     G. Age:  34w 2d         13  %    FL/BPD:     81.5   %    71 - 87  FL:       65.5  mm     G. Age:  33w 5d  3.8  %    FL/AC:      21.6   %    20 - 24  LV:        1.6  mm  Est. FW:    2301  gm      5 lb 1 oz      7  % ---------------------------------------------------------------------- OB History  Gravidity:    3         Term:   2  Living:       2 ---------------------------------------------------------------------- Gestational Age  LMP:           36w 1d        Date:  01/27/23                  EDD:   11/03/23  U/S Today:     33w 4d                                        EDD:   11/21/23  Best:          36w 1d     Det. By:  LMP  (01/27/23)          EDD:   11/03/23 ---------------------------------------------------------------------- Anatomy  Cranium:               Appears normal         Aortic Arch:            Previously seen  Cavum:                 Previously seen        Ductal Arch:            Previously seen  Ventricles:            Appears normal         Diaphragm:              Appears normal  Choroid Plexus:        Previously seen        Stomach:                Appears normal, left                                                                         sided  Cerebellum:            Previously seen        Abdomen:                Previously seen  Posterior Fossa:       Previously seen        Abdominal Wall:         Previously seen  Nuchal Fold:           Appears normal         Cord Vessels:           Previously seen  Face:                  Orbits and profile     Kidneys:  Appear normal                         previously seen  Lips:                  Previously seen        Bladder:                Appears normal  Thoracic:              Previously seen        Spine:                  Previously seen  Heart:                 Appears normal         Upper Extremities:      Previously seen                         (4CH, axis, and                         situs)  RVOT:                  Previously seen        Lower Extremities:      Previously seen  LVOT:                  Previously seen  Other:  Fetal anatomic survey complete on prior scans. ---------------------------------------------------------------------- Doppler - Fetal Vessels  Umbilical Artery   S/D     %tile      RI    %tile      PI    %tile     PSV    ADFV    RDFV                                                     (cm/s)   3.79   > 97.5    0.74   > 97.5    1.33   > 97.5    56.24      No      No ---------------------------------------------------------------------- Cervix Uterus Adnexa  Cervix  Not visualized (advanced GA >24wks)  Uterus  No abnormality visualized.  Right Ovary  Within normal limits.  Left Ovary  Within normal limits.  Cul De Sac  No free fluid seen.  Adnexa  No abnormality visualized ---------------------------------------------------------------------- Impression  Patient is here for fetal growth assessment.  History of fetal growth restriction in her first pregnancy.  She does not have gestational diabetes.  Blood pressure  today at our office is 111/64 mmHg.  Obstetrical history is significant for 2 term vaginal  deliveries.  Ultrasound  On today's ultrasound, the estimated fetal weight is at the 7th  percentile and the abdominal circumference measurement at  the 13th percentile.  Head circumference measurement is at  between -1 and -2 SD.  Umbilical artery Doppler showed  increased S/D ratio.  NST is reactive.  BPP 10/10.  Fetal growth restriction  I explained the finding of fetal growth restriction that is difficult  to differentiate from a constitutionally small for gestational  age fetus. Most fetuses are small but not  growth restricted.  However, abnormal umbilical artery Doppler study is  consistent with growth restriction.  I discussed the possible causes of fetal growth restriction  including placental insufficiency (most common cause),  chromosomal anomalies and fetal infections.  Patient did not  give history of fever or rashes.  Patient had fetal growth restriction in her first pregnancy and  both her children are alive and well.  I discussed timing of delivery.  Since severe fetal growth  restriction is associated with increased risk of perinatal  mortality and morbidity, we recommend delivery at [redacted] weeks  gestation.  Vaginal delivery can be safely attempted.  Patient agreed with my recommendations.  Sent a message to [employer], who will be scheduling  IOL. ---------------------------------------------------------------------- Recommendations  - Delivery at [redacted] weeks gestation. ----------------------------------------------------------------------                 Fredia Fresh, MD Electronically Signed Final Report   10/07/2023 09:39 am ----------------------------------------------------------------------   MDM & MAU COURSE  MDM: Moderate  MAU Course: -Vital signs within normal limits. FHT appropriate in triage. -No direct abdominal trauma. Will monitor x4 hours. -Heating pack for muscle soreness in left thigh. -Discussed with Dr. Jayne, given no direct abdominal trauma; the absence of vaginal  bleeding, leaking of fluids, contractions; and reactive NST monitoring for 4 hours after fall is appropriate.  No orders of the defined types were placed in this encounter.  No orders of the defined types were placed in this encounter.   ASSESSMENT   1. Injury of left knee, initial encounter   2. Fall due to wet surface, initial encounter   3. [redacted] weeks gestation of pregnancy     PLAN  Discharge home in stable condition with preterm labor precautions.  Recommend ice for bony pain, ice/heat for muscle strain, Tylenol  as needed.     Follow-up Information     Center for [facility] Follow up.   Specialty: Obstetrics and Gynecology Why: As scheduled for ongoing prenatal care Contact information: [address] [place]  [address] [phone]                 Allergies as of 10/07/2023       Reactions   Sulfonamide Derivatives Hives        Medication List     TAKE these medications    PRENATAL GUMMIES PO Take 2 tablets by mouth daily.   Vitamin D  (Ergocalciferol ) 1.25 MG (50000 UNIT) Caps capsule Commonly known as: DRISDOL  Take 1 capsule (50,000 Units total) by mouth every 7 (seven) days for 12 doses.        Joesph DELENA Sear, PA

## 2023-10-07 NOTE — Discharge Instructions (Signed)

## 2023-10-07 NOTE — Procedures (Signed)
 [name] [name] Jun 02, 1985 [redacted]w[redacted]d  Fetus A Non-Stress Test Interpretation for 10/07/23  Indication: IUGR  Fetal Heart Rate A Mode: External Baseline Rate (A): 130 bpm Variability: Moderate Accelerations: 15 x 15 Decelerations: None Multiple birth?: No  Uterine Activity Mode: Palpation, Toco Contraction Frequency (min): irregular Contraction Quality:  (non-palpable; pt reports not feeling) Resting Tone Palpated: Relaxed  Interpretation (Fetal Testing) Nonstress Test Interpretation: Reactive Comments: Reviewed with Dr. Arna

## 2023-10-07 NOTE — MAU Note (Signed)
 [name] is a 38 y.o. at [redacted]w[redacted]d here in MAU reporting: she fell approximately 1.5 hours ago while grocery shopping.  Reports she slipped from a wet area on the floor, landing onto her left knee.  Denies striking abdomen.  Denies VB or LOF.  Endorses some FM since fall.  LMP: 01/27/2023 Onset of complaint: today approx 1515 Pain score: 7 L knee & 9 L inner thigh Vitals:   10/07/23 1659  BP: 105/61  Pulse: 82  Resp: 18  Temp: 97.8 F (36.6 C)  SpO2: 100%     FHT: 145 bpm  Lab orders placed from triage: None

## 2023-10-07 NOTE — Progress Notes (Signed)
 Scheduled IOL for new dx IUGR with abnormal doppler per MFM

## 2023-10-07 NOTE — Telephone Encounter (Signed)
 Preadmission screen

## 2023-10-08 LAB — GC/CHLAMYDIA PROBE AMP (~~LOC~~) NOT AT ARMC
Chlamydia: NEGATIVE
Comment: NEGATIVE
Comment: NORMAL
Neisseria Gonorrhea: NEGATIVE

## 2023-10-10 ENCOUNTER — Ambulatory Visit: Payer: Self-pay | Admitting: Obstetrics and Gynecology

## 2023-10-11 LAB — CULTURE, BETA STREP (GROUP B ONLY): Strep Gp B Culture: NEGATIVE

## 2023-10-12 ENCOUNTER — Encounter: Payer: Self-pay | Admitting: Obstetrics and Gynecology

## 2023-10-12 ENCOUNTER — Ambulatory Visit (INDEPENDENT_AMBULATORY_CARE_PROVIDER_SITE_OTHER): Payer: Self-pay | Admitting: Obstetrics and Gynecology

## 2023-10-12 ENCOUNTER — Other Ambulatory Visit: Payer: Self-pay

## 2023-10-12 ENCOUNTER — Encounter: Payer: Self-pay | Admitting: Certified Nurse Midwife

## 2023-10-12 VITALS — BP 116/78 | HR 72 | Wt 160.8 lb

## 2023-10-12 DIAGNOSIS — O0932 Supervision of pregnancy with insufficient antenatal care, second trimester: Secondary | ICD-10-CM

## 2023-10-12 DIAGNOSIS — O99013 Anemia complicating pregnancy, third trimester: Secondary | ICD-10-CM | POA: Diagnosis not present

## 2023-10-12 DIAGNOSIS — Z3A36 36 weeks gestation of pregnancy: Secondary | ICD-10-CM

## 2023-10-12 DIAGNOSIS — Z3009 Encounter for other general counseling and advice on contraception: Secondary | ICD-10-CM

## 2023-10-12 DIAGNOSIS — A5901 Trichomonal vulvovaginitis: Secondary | ICD-10-CM

## 2023-10-12 DIAGNOSIS — O23593 Infection of other part of genital tract in pregnancy, third trimester: Secondary | ICD-10-CM | POA: Diagnosis not present

## 2023-10-12 DIAGNOSIS — O0993 Supervision of high risk pregnancy, unspecified, third trimester: Secondary | ICD-10-CM

## 2023-10-12 DIAGNOSIS — O09523 Supervision of elderly multigravida, third trimester: Secondary | ICD-10-CM

## 2023-10-12 DIAGNOSIS — O365990 Maternal care for other known or suspected poor fetal growth, unspecified trimester, not applicable or unspecified: Secondary | ICD-10-CM

## 2023-10-12 DIAGNOSIS — Z8759 Personal history of other complications of pregnancy, childbirth and the puerperium: Secondary | ICD-10-CM

## 2023-10-12 DIAGNOSIS — O09522 Supervision of elderly multigravida, second trimester: Secondary | ICD-10-CM

## 2023-10-12 DIAGNOSIS — Z8659 Personal history of other mental and behavioral disorders: Secondary | ICD-10-CM

## 2023-10-12 DIAGNOSIS — D509 Iron deficiency anemia, unspecified: Secondary | ICD-10-CM

## 2023-10-12 DIAGNOSIS — O0990 Supervision of high risk pregnancy, unspecified, unspecified trimester: Secondary | ICD-10-CM

## 2023-10-12 NOTE — Progress Notes (Signed)
   PRENATAL VISIT NOTE  Subjective:  [name] [name] is a 38 y.o. G3P2002 at [redacted]w[redacted]d being seen today for ongoing prenatal care.  She is currently monitored for the following issues for this high-risk pregnancy and has Thrombocytopenia, unspecified (HCC); History of prior pregnancy with IUGR newborn; Anemia of pregnancy; AMA (advanced maternal age) multigravida 35+, second trimester; Supervision of high risk pregnancy, antepartum; History of postpartum depression; Trichomonal vaginitis in pregnancy in second trimester; Late prenatal care affecting pregnancy in second trimester; Supervision of high risk pregnancy due to social problems in third trimester; Low vitamin D  level; Unwanted fertility; IDA (iron  deficiency anemia); and IUGR (intrauterine growth restriction) affecting care of mother on their problem list.  Patient reports pressure.  Contractions: Irritability. Vag. Bleeding: None.  Movement: Present. Denies leaking of fluid.   The following portions of the patient's history were reviewed and updated as appropriate: allergies, current medications, past family history, past medical history, past social history, past surgical history and problem list.   Objective:    Vitals:   10/12/23 1504  BP: 116/78  Pulse: 72  Weight: 160 lb 12.8 oz (72.9 kg)   Fetal Status:  Fetal Heart Rate (bpm): 130   Movement: Present    General: Alert, oriented and cooperative. Patient is in no acute distress.  Skin: Skin is warm and dry. No rash noted.   Cardiovascular: Normal heart rate noted  Respiratory: Normal respiratory effort, no problems with respiration noted  Abdomen: Soft, gravid, appropriate for gestational age.  Pain/Pressure: Present     Pelvic: Cervical exam performed in the presence of a chaperone Dilation: 1.5 Effacement (%): 20 Station: -3  Extremities: Normal range of motion.  Edema: Trace  Mental Status: Normal mood and affect. Normal behavior. Normal judgment and thought content.    Assessment and Plan:  Pregnancy: G3P2002 at [redacted]w[redacted]d  1. Trichomonal vaginitis in pregnancy in second trimester (Primary) Neg TOC  2. History of prior pregnancy with IUGR newborn  3. Anemia during pregnancy in third trimester  4. AMA (advanced maternal age) multigravida 35+, second trimester  5. Supervision of high risk pregnancy, antepartum Has IOL scheduled for Thursday Planning to place babies for adoption, seen with case worker Already has been in contact with SW  6. History of postpartum depression  7. Late prenatal care affecting pregnancy in second trimester  8. Unwanted fertility BTL papers signed, still wants to proceed  9. Iron  deficiency anemia, unspecified iron  deficiency anemia type S/p IV iron  infusions  10. Poor fetal growth affecting management of mother, antepartum, single or unspecified fetus Last growth 7th%tile, abnormal dopplers   Preterm labor symptoms and general obstetric precautions including but not limited to vaginal bleeding, contractions, leaking of fluid and fetal movement were reviewed in detail with the patient. Please refer to After Visit Summary for other counseling recommendations.   Return in about 1 month (around 11/11/2023) for post partum check.  Future Appointments  Date Time Provider Department Center  10/14/2023  7:00 AM [facility] SCHED ROOM [facility]INDC None  10/20/2023  8:15 AM [facility]  11/11/2023  2:35 PM Eveline Lynwood MATSU, MD [facility]  03/07/2024 10:30 AM [facility] LAB [facility] None  03/07/2024 11:00 AM Loretha Ash, MD [facility] None    Burnard CHRISTELLA Moats, MD

## 2023-10-14 ENCOUNTER — Inpatient Hospital Stay (HOSPITAL_COMMUNITY)

## 2023-10-14 ENCOUNTER — Encounter (HOSPITAL_COMMUNITY): Payer: Self-pay | Admitting: Obstetrics and Gynecology

## 2023-10-14 ENCOUNTER — Other Ambulatory Visit: Payer: Self-pay

## 2023-10-14 ENCOUNTER — Inpatient Hospital Stay (HOSPITAL_COMMUNITY)
Admission: RE | Admit: 2023-10-14 | Discharge: 2023-10-16 | DRG: 806 | Disposition: A | Discharge status: Home / Self Care | Attending: Obstetrics and Gynecology | Admitting: Obstetrics and Gynecology

## 2023-10-14 DIAGNOSIS — O09523 Supervision of elderly multigravida, third trimester: Secondary | ICD-10-CM | POA: Diagnosis not present

## 2023-10-14 DIAGNOSIS — Z87891 Personal history of nicotine dependence: Secondary | ICD-10-CM | POA: Diagnosis not present

## 2023-10-14 DIAGNOSIS — O9912 Other diseases of the blood and blood-forming organs and certain disorders involving the immune mechanism complicating childbirth: Secondary | ICD-10-CM | POA: Diagnosis present

## 2023-10-14 DIAGNOSIS — Z3009 Encounter for other general counseling and advice on contraception: Secondary | ICD-10-CM | POA: Diagnosis present

## 2023-10-14 DIAGNOSIS — O0932 Supervision of pregnancy with insufficient antenatal care, second trimester: Secondary | ICD-10-CM

## 2023-10-14 DIAGNOSIS — O0990 Supervision of high risk pregnancy, unspecified, unspecified trimester: Secondary | ICD-10-CM

## 2023-10-14 DIAGNOSIS — Z23 Encounter for immunization: Secondary | ICD-10-CM

## 2023-10-14 DIAGNOSIS — Z3A37 37 weeks gestation of pregnancy: Secondary | ICD-10-CM

## 2023-10-14 DIAGNOSIS — D696 Thrombocytopenia, unspecified: Secondary | ICD-10-CM | POA: Diagnosis present

## 2023-10-14 DIAGNOSIS — Z8659 Personal history of other mental and behavioral disorders: Secondary | ICD-10-CM

## 2023-10-14 DIAGNOSIS — O365930 Maternal care for other known or suspected poor fetal growth, third trimester, not applicable or unspecified: Secondary | ICD-10-CM | POA: Diagnosis present

## 2023-10-14 DIAGNOSIS — O09522 Supervision of elderly multigravida, second trimester: Secondary | ICD-10-CM | POA: Diagnosis present

## 2023-10-14 DIAGNOSIS — O365990 Maternal care for other known or suspected poor fetal growth, unspecified trimester, not applicable or unspecified: Principal | ICD-10-CM | POA: Diagnosis present

## 2023-10-14 DIAGNOSIS — Z8249 Family history of ischemic heart disease and other diseases of the circulatory system: Secondary | ICD-10-CM | POA: Diagnosis not present

## 2023-10-14 LAB — CBC
HCT: 32.5 % — ABNORMAL LOW (ref 36.0–46.0)
Hemoglobin: 10.6 g/dL — ABNORMAL LOW (ref 12.0–15.0)
MCH: 28.2 pg (ref 26.0–34.0)
MCHC: 32.6 g/dL (ref 30.0–36.0)
MCV: 86.4 fL (ref 80.0–100.0)
Platelets: 94 K/uL — ABNORMAL LOW (ref 150–400)
RBC: 3.76 MIL/uL — ABNORMAL LOW (ref 3.87–5.11)
RDW: 18.7 % — ABNORMAL HIGH (ref 11.5–15.5)
WBC: 9.4 K/uL (ref 4.0–10.5)
nRBC: 0 % (ref 0.0–0.2)

## 2023-10-14 LAB — TYPE AND SCREEN
ABO/RH(D): A POS
Antibody Screen: NEGATIVE

## 2023-10-14 MED ORDER — ACETAMINOPHEN 325 MG PO TABS
650.0000 mg | ORAL_TABLET | ORAL | Status: DC | PRN
  Filled 2023-10-14 (×3): qty 2

## 2023-10-14 MED ORDER — OXYTOCIN-SODIUM CHLORIDE 30-0.9 UT/500ML-% IV SOLN
1.0000 m[IU]/min | INTRAVENOUS | Status: DC
  Administered 2023-10-14: 2 m[IU]/min via INTRAVENOUS
  Filled 2023-10-14: qty 500

## 2023-10-14 MED ORDER — FENTANYL CITRATE (PF) 100 MCG/2ML IJ SOLN: 50.0000 ug | INTRAMUSCULAR | Status: DC | PRN

## 2023-10-14 MED ORDER — MISOPROSTOL 50MCG HALF TABLET: 50.0000 ug | ORAL_TABLET | ORAL | Status: DC | PRN

## 2023-10-14 MED ORDER — OXYTOCIN-SODIUM CHLORIDE 30-0.9 UT/500ML-% IV SOLN: 1.0000 m[IU]/min | INTRAVENOUS | Status: DC

## 2023-10-14 MED ORDER — LIDOCAINE HCL (PF) 1 % IJ SOLN: 30.0000 mL | INTRAMUSCULAR | Status: DC | PRN

## 2023-10-14 MED ORDER — SOD CITRATE-CITRIC ACID 500-334 MG/5ML PO SOLN: 30.0000 mL | ORAL | Status: DC | PRN

## 2023-10-14 MED ORDER — SENNOSIDES-DOCUSATE SODIUM 8.6-50 MG PO TABS
2.0000 | ORAL_TABLET | ORAL | Status: DC | PRN
  Administered 2023-10-14: 2 via ORAL
  Filled 2023-10-14 (×2): qty 2

## 2023-10-14 MED ORDER — LACTATED RINGERS IV SOLN: 500.0000 mL | INTRAVENOUS | Status: AC | PRN

## 2023-10-14 MED ORDER — LACTATED RINGERS IV SOLN: INTRAVENOUS | Status: AC

## 2023-10-14 MED ORDER — MISOPROSTOL 25 MCG QUARTER TABLET
25.0000 ug | ORAL_TABLET | Freq: Once | ORAL | Status: AC
  Administered 2023-10-14: 25 ug via VAGINAL
  Filled 2023-10-14: qty 1

## 2023-10-14 MED ORDER — OXYTOCIN-SODIUM CHLORIDE 30-0.9 UT/500ML-% IV SOLN
1.0000 m[IU]/min | INTRAVENOUS | Status: DC
  Administered 2023-10-14: 1 m[IU]/min via INTRAVENOUS

## 2023-10-14 MED ORDER — OXYTOCIN-SODIUM CHLORIDE 30-0.9 UT/500ML-% IV SOLN
2.5000 [IU]/h | INTRAVENOUS | Status: DC
  Administered 2023-10-15: 2.5 [IU]/h via INTRAVENOUS

## 2023-10-14 MED ORDER — ONDANSETRON HCL 4 MG/2ML IJ SOLN: 4.0000 mg | Freq: Four times a day (QID) | INTRAMUSCULAR | Status: DC | PRN

## 2023-10-14 MED ORDER — MISOPROSTOL 50MCG HALF TABLET
50.0000 ug | ORAL_TABLET | Freq: Once | ORAL | Status: AC
  Administered 2023-10-14: 50 ug via ORAL
  Filled 2023-10-14: qty 1

## 2023-10-14 MED ORDER — SENNOSIDES-DOCUSATE SODIUM 8.6-50 MG PO TABS: 2.0000 | ORAL_TABLET | Freq: Every evening | ORAL | Status: DC | PRN

## 2023-10-14 MED ORDER — OXYTOCIN BOLUS FROM INFUSION
333.0000 mL | Freq: Once | INTRAVENOUS | Status: AC
  Administered 2023-10-15: 333 mL via INTRAVENOUS

## 2023-10-14 MED ORDER — TERBUTALINE SULFATE 1 MG/ML IJ SOLN
0.2500 mg | Freq: Once | INTRAMUSCULAR | Status: AC | PRN
  Administered 2023-10-15: 0.25 mg via SUBCUTANEOUS
  Filled 2023-10-14: qty 1

## 2023-10-14 NOTE — H&P (Signed)
 [name] [name] is a 38 y.o. G42P2002 female at [redacted]w[redacted]d by LMP c/w [redacted]w[redacted]d u/s presenting for IOL for FGR.   Reports active fetal movement, contractions: irreg & mild, vaginal bleeding: none, membranes: intact.  Initiated prenatal care at [facility] at 26.5 wks.   Most recent u/s : [redacted]w[redacted]d, EFW 7%, AFI 24cm, post placenta, cephalic .   This pregnancy complicated by: # placing baby for adoption # FGR with ^ S/D ratio # AMA # late to care @ 26wks # desires ppBTL # gest thrombocytopenia  Prenatal History/Complications:  # term vag deliveries 2015 and 2022 # hx PPD  Past Medical History: Past Medical History:  Diagnosis Date   Anemia    GERD (gastroesophageal reflux disease)    OTC as needed   Gonorrhea    History of trichomoniasis    Laceration of right thumb 08/16/2011   right thumb digital nerve lac.; sutures x 8, per pt.    Past Surgical History: Past Surgical History:  Procedure Laterality Date   NERVE REPAIR  08/31/2011   Procedure: NERVE REPAIR;  Surgeon: Franky JONELLE Curia, MD;  Location: [facility];  Service: Orthopedics;  Laterality: Right;  right thumb digital nerve repair   WISDOM TOOTH EXTRACTION      Obstetrical History: OB History     Gravida  3   Para  2   Term  2   Preterm      AB      Living  2      SAB      IAB      Ectopic      Multiple  0   Live Births  2           Social History: Social History   Socioeconomic History   Marital status: Legally Separated    Spouse name: Not on file   Number of children: Not on file   Years of education: Not on file   Highest education level: Not on file  Occupational History   Not on file  Tobacco Use   Smoking status: Former    Current packs/day: 0.00    Types: Cigarettes    Start date: 05/05/2009    Quit date: 05/05/2013    Years since quitting: 10.4   Smokeless tobacco: Never   Tobacco comments:    quit with pos preg  Vaping Use   Vaping status: Never Used  Substance and  Sexual Activity   Alcohol use: No   Drug use: No   Sexual activity: Yes    Birth control/protection: None  Other Topics Concern   Not on file  Social History Narrative   Not on file   Social Drivers of Health   Financial Resource Strain: Low Risk  (09/07/2023)   Overall Financial Resource Strain (CARDIA)    Difficulty of Paying Living Expenses: Not very hard  Food Insecurity: No Food Insecurity (10/14/2023)   Hunger Vital Sign    Worried About Running Out of Food in the Last Year: Never true    Ran Out of Food in the Last Year: Never true  Transportation Needs: No Transportation Needs (10/14/2023)   PRAPARE - Administrator, Civil Service (Medical): No    Lack of Transportation (Non-Medical): No  Physical Activity: Patient Declined (09/07/2023)   Exercise Vital Sign    Days of Exercise per Week: Patient declined    Minutes of Exercise per Session: Patient declined  Stress: Not on file  Social Connections:  Not on file    Family History: Family History  Problem Relation Age of Onset   Hypertension Mother    Glaucoma Father    Stroke Maternal Grandfather    Heart disease Maternal Grandfather    Hearing loss Neg Hx     Allergies: Allergies  Allergen Reactions   Sulfonamide Derivatives Hives    Medications Prior to Admission  Medication Sig Dispense Refill Last Dose/Taking   Prenatal MV & Min w/FA-DHA (PRENATAL GUMMIES PO) Take 2 tablets by mouth daily.      Vitamin D , Ergocalciferol , (DRISDOL ) 1.25 MG (50000 UNIT) CAPS capsule Take 1 capsule (50,000 Units total) by mouth every 7 (seven) days for 12 doses. 12 capsule 0     Review of Systems  Pertinent pos/neg as indicated in HPI  Blood pressure (!) 101/58, pulse 92, temperature 98.3 F (36.8 C), temperature source Oral, resp. rate 16, height 5' 2 (1.575 m), weight 73.2 kg, last menstrual period 01/27/2023. General appearance: alert, cooperative, and no distress Lungs: clear to auscultation  bilaterally Heart: regular rate and rhythm Abdomen: gravid, soft, non-tender, EFW by Leopold's approximately 6lbs Extremities: tr edema  Fetal monitoring: FHR: 140s bpm, variability: moderate,  Accelerations: Present,  decelerations:  Absent Uterine activity: irreg Dilation: 1.5 Effacement (%): Thick Station: -2 Exam by:: Alison Kubicki, CNM Presentation: cephalic   Prenatal labs: ABO, Rh: --/--/PENDING (10/02 9488) Antibody: PENDING (10/02 0511) Rubella: 1.73 (07/21 0846) RPR: Non Reactive (07/21 0846)  HBsAg: Negative (07/21 0846)  HIV: Non Reactive (07/21 0846)  GBS: Negative/-- (09/25 0856)  2hr GTT: 68/69/107  Prenatal Transfer Tool  Maternal Diabetes: No Genetic Screening: Normal Maternal Ultrasounds/Referrals: IUGR Fetal Ultrasounds or other Referrals:  None Maternal Substance Abuse:  No Significant Maternal Medications:  None Significant Maternal Lab Results: Group B Strep negative  Results for orders placed or performed during the hospital encounter of 10/14/23 (from the past 24 hours)  Type and screen   Collection Time: 10/14/23  5:11 AM  Result Value Ref Range   ABO/RH(D) PENDING    Antibody Screen PENDING    Sample Expiration      10/17/2023,2359 Performed at [facility], [address], [place], [place] [address]      Assessment:  [redacted]w[redacted]d SIUP  [id number]  FGR with ^ S/D ratio  Gest thrombocytopenia (plts 94)  Cat 1 FHR  GBS Negative/-- (09/25 0856)  Plan:  Admit to L&D  IV pain meds/epidural prn active labor  Cytotec  for cx ripening  Monitor plts; pt understands anesthesia concerns with epidural placement  Doesn't wish to see/hold the baby after delivery; has made a plan with [facility]  Anticipate vag delivery   Plans to bottlefeed  Contraception: ppBTL (papers 8/26 under media)  Be mindful of hx PPD    Suzen JONETTA Gentry CNM 10/14/2023, 5:47 AM

## 2023-10-14 NOTE — Progress Notes (Signed)
 Patient Vitals for the past 4 hrs:  BP Pulse Resp SpO2  10/14/23 1930 98/85 62 16 --  10/14/23 1900 100/68 64 -- --  10/14/23 1835 -- -- -- 100 %  10/14/23 1830 107/72 69 -- --  10/14/23 1800 101/75 68 -- --  10/14/23 1730 107/75 64 -- --  10/14/23 1700 (!) 90/47 68 -- --  10/14/23 1630 (!) 99/53 70 -- 100 %  10/14/23 1600 (!) 98/44 66 -- --   Ctx still mild, q 2-4 minutes.. FHR Cat 1. Pitocin  @ 2 mu/min (restarted an hour ago)  No change in cx, vtx very ballottable.  Will continue to titrate up pitocin 

## 2023-10-14 NOTE — Progress Notes (Signed)
 CNM heard yelling and what sounded like slapping through pt's door. CNM opened door and FOB ran out of room. Pt stated that she was fine and seemed unharmed. Security called. Arrived at room shortly after. Pt stated that FOB did not need to be kept out of the room.   [name] [name]  [name] [name] 10/14/2023 8:28 PM

## 2023-10-14 NOTE — Progress Notes (Addendum)
 Labor Progress Note [name] [name] is a 38 y.o. G3P2002 at [redacted]w[redacted]d presented for IOL due to FGR.   S:  [name] [name] reports she is feeling a lot of pressure, like a period cramp but more. Was previously walking around the room until [place], [place] noticed late decels and has been trying repositioning techniques with [name] [name], which has so far worked.   O:  BP (!) 115/59   Pulse 60   Temp 97.7 F (36.5 C) (Oral)   Resp 16   Ht 5' 2 (1.575 m)   Wt 73.2 kg   LMP 01/27/2023 (Exact Date)   BMI 29.52 kg/m   EFM: baseline 145 bpm/ good variability/ none accels/ late decels  Toco/IUPC: 1-20min SVE: Dilation: 5 Effacement (%): Thick Cervical Position: Middle Station: Ballotable Presentation: Vertex Exam by:: [name] [name] CNM Pitocin : Currently OFF due to decels, but was 6 mu/min  A/P: 38 y.o. G3P2002 [redacted]w[redacted]d  1. Labor: will likely add pitocin  back, currently off due to decels 2. FWB: Category 2 3. Pain: IV pain medications  Anticipate SVD. Staff to be aware that patient does not want to hold baby; [employer] involved.   [name] Compton, MD 3:18 PM  I was perfomred the exam and agree with above.  [name] [name], [name] [name] , CNM 10/14/2023 8:03 PM

## 2023-10-14 NOTE — Progress Notes (Signed)
 LABOR PROGRESS NOTE In to meet the patient beginning of shift Patient comfortable, resting in bed  FHT: baseline 140, mod variability, +accels, rare variable decels; overall category II. Toco: none  A/P:  Reeval in 2hr for FB and miso   Barabara Maier, DO 9:11 AM

## 2023-10-14 NOTE — Progress Notes (Signed)
 LABOR PROGRESS NOTE Pt rechecked at 1130 Patient comfortable in bed, not planning for epidural.  SCE: 3/30/-2 but ballotable  FHT: baseline 135, mod variability, +accels, rare variable decel; overall category II. Toco: q2-3 min  A/P:  Start pitocin , 2x2 with careful monitoring of fetal tolerance AROM at next check Encourage ambulation  Choosing adoption, [name] does not want to hold baby or room in. RN to cut the cord and assess baby outside of the room after birth.    [name] Maier, DO 11:51 AM

## 2023-10-15 ENCOUNTER — Encounter (HOSPITAL_COMMUNITY): Payer: Self-pay | Admitting: Anesthesiology

## 2023-10-15 ENCOUNTER — Encounter (HOSPITAL_COMMUNITY): Payer: Self-pay | Admitting: Obstetrics and Gynecology

## 2023-10-15 ENCOUNTER — Encounter (HOSPITAL_COMMUNITY)
Admission: RE | Disposition: A | Discharge status: Home / Self Care | Payer: Self-pay | Source: Home / Self Care | Attending: Obstetrics and Gynecology

## 2023-10-15 DIAGNOSIS — O365930 Maternal care for other known or suspected poor fetal growth, third trimester, not applicable or unspecified: Secondary | ICD-10-CM

## 2023-10-15 DIAGNOSIS — O9912 Other diseases of the blood and blood-forming organs and certain disorders involving the immune mechanism complicating childbirth: Secondary | ICD-10-CM

## 2023-10-15 DIAGNOSIS — O09523 Supervision of elderly multigravida, third trimester: Secondary | ICD-10-CM

## 2023-10-15 DIAGNOSIS — Z3A37 37 weeks gestation of pregnancy: Secondary | ICD-10-CM

## 2023-10-15 LAB — CBC
HCT: 32 % — ABNORMAL LOW (ref 36.0–46.0)
Hemoglobin: 10.6 g/dL — ABNORMAL LOW (ref 12.0–15.0)
MCH: 28.4 pg (ref 26.0–34.0)
MCHC: 33.1 g/dL (ref 30.0–36.0)
MCV: 85.8 fL (ref 80.0–100.0)
Platelets: 83 K/uL — ABNORMAL LOW (ref 150–400)
RBC: 3.73 MIL/uL — ABNORMAL LOW (ref 3.87–5.11)
RDW: 18.4 % — ABNORMAL HIGH (ref 11.5–15.5)
WBC: 15.5 K/uL — ABNORMAL HIGH (ref 4.0–10.5)
nRBC: 0 % (ref 0.0–0.2)

## 2023-10-15 LAB — RPR: RPR Ser Ql: NONREACTIVE

## 2023-10-15 SURGERY — LIGATION, FALLOPIAN TUBE, POSTPARTUM
Anesthesia: Choice | Laterality: Bilateral

## 2023-10-15 MED ORDER — WITCH HAZEL-GLYCERIN EX PADS: 1.0000 | MEDICATED_PAD | CUTANEOUS | Status: DC | PRN

## 2023-10-15 MED ORDER — ONDANSETRON HCL 4 MG PO TABS: 4.0000 mg | ORAL_TABLET | ORAL | Status: DC | PRN

## 2023-10-15 MED ORDER — IBUPROFEN 600 MG PO TABS
600.0000 mg | ORAL_TABLET | Freq: Four times a day (QID) | ORAL | Status: DC
  Administered 2023-10-15 – 2023-10-16 (×6): 600 mg via ORAL
  Filled 2023-10-15 (×6): qty 1

## 2023-10-15 MED ORDER — SENNOSIDES-DOCUSATE SODIUM 8.6-50 MG PO TABS: 2.0000 | ORAL_TABLET | Freq: Every day | ORAL | Status: DC

## 2023-10-15 MED ORDER — ONDANSETRON HCL 4 MG/2ML IJ SOLN: 4.0000 mg | INTRAMUSCULAR | Status: DC | PRN

## 2023-10-15 MED ORDER — COCONUT OIL OIL: 1.0000 | TOPICAL_OIL | Status: DC | PRN

## 2023-10-15 MED ORDER — DIPHENHYDRAMINE HCL 25 MG PO CAPS: 25.0000 mg | ORAL_CAPSULE | Freq: Four times a day (QID) | ORAL | Status: DC | PRN

## 2023-10-15 MED ORDER — ACETAMINOPHEN 325 MG PO TABS
650.0000 mg | ORAL_TABLET | ORAL | Status: DC | PRN
  Administered 2023-10-15 – 2023-10-16 (×4): 650 mg via ORAL
  Filled 2023-10-15: qty 2

## 2023-10-15 MED ORDER — BENZOCAINE-MENTHOL 20-0.5 % EX AERO
1.0000 | INHALATION_SPRAY | CUTANEOUS | Status: DC | PRN
  Administered 2023-10-15: 1 via TOPICAL
  Filled 2023-10-15: qty 56

## 2023-10-15 MED ORDER — TETANUS-DIPHTH-ACELL PERTUSSIS 5-2-15.5 LF-MCG/0.5 IM SUSP: 0.5000 mL | Freq: Once | INTRAMUSCULAR | Status: DC

## 2023-10-15 MED ORDER — DIBUCAINE (PERIANAL) 1 % EX OINT: 1.0000 | TOPICAL_OINTMENT | CUTANEOUS | Status: DC | PRN

## 2023-10-15 MED ORDER — PRENATAL MULTIVITAMIN CH
1.0000 | ORAL_TABLET | Freq: Every day | ORAL | Status: DC
  Administered 2023-10-15: 1 via ORAL
  Filled 2023-10-15: qty 1

## 2023-10-15 MED ORDER — ZOLPIDEM TARTRATE 5 MG PO TABS: 5.0000 mg | ORAL_TABLET | Freq: Every evening | ORAL | Status: DC | PRN

## 2023-10-15 MED ORDER — OXYCODONE HCL 5 MG PO TABS
5.0000 mg | ORAL_TABLET | ORAL | Status: DC | PRN
  Filled 2023-10-15: qty 1

## 2023-10-15 MED ORDER — SIMETHICONE 80 MG PO CHEW: 80.0000 mg | CHEWABLE_TABLET | ORAL | Status: DC | PRN

## 2023-10-15 NOTE — Clinical Social Work Maternal (Signed)
 CLINICAL SOCIAL WORK MATERNAL/CHILD NOTE  Patient Details  Name: [name] [name] MRN: [id number] Date of Birth: 03/04/1985  Date:  10/15/2023  Clinical Social Worker Initiating Note:  Nat Quiet, [place] Date/Time: Initiated:  10/15/23/1205     Child's Name:      Biological Parents:  Mother, Father [name] [name] 01/24/1985)   Need for Interpreter:  None   Reason for Referral:  Adoption   Address:  [address] [place] [address]    Phone number:  [phone] (home)     Additional phone number:   Household Members/Support Persons (HM/SP):   Household Member/Support Person 1   HM/SP Name Relationship DOB or Age  HM/SP -1 [name] [name] Daughter 06/11/2020  HM/SP -2        HM/SP -3        HM/SP -4        HM/SP -5        HM/SP -6        HM/SP -7        HM/SP -8          Natural Supports (not living in the home):  Spouse/significant other, Parent   Professional Supports: Therapist   Employment: Full-time   Type of Work: [employer]   Education:  High school graduate   Homebound arranged:    Surveyor, quantity Resources:  Medicaid   Other Resources:      Cultural/Religious Considerations Which May Impact Care:    Strengths:      Psychotropic Medications:         Pediatrician:       Pediatrician List:   Radiographer, therapeutic    [place]    [facility]      Pediatrician Fax Number:    Risk Factors/Current Problems:  None   Cognitive State:  Able to Concentrate  , Alert     Mood/Affect:  Calm  , Tearful  , Interested  , Comfortable  , Relaxed     CSW Assessment: CSW received consult for Baby Up for Adoption.  CSW met with MOB to offer support and complete assessment.   CSW met with MOB at bedside and introduced CSW role. MOB appeared, calm, pleasant and appropriately engaged throughout the assessment. MOB had visitors present at bedside, but they left the room to  allow her privacy. MOB identified her visitors as her mom, and boyfriend that she clarified was not the father of the baby. She expressed both have been very supportive of her during the pregnancy and of her decision to place the baby up for adoption. CSW acknowledged MOB's support system and encouraged her to continue to rely on them. CSW inquired if MOB's demographic information on hospital file was correct. MOB reported that her information on hospital fie was correct and stated that she lives with her three-year-old daughter [name] [name]). CSW inquired about MOB older child. MOB reported that her son [name] [name], 11/26/13) resides with his father. She reported that she is employed at [employer].  CSW assessed MOB's feelings surrounding the adoption. MOB expressed that she had decided to place the baby up for adoption due to her history of postpartum depression and ongoing mental health concerns, and stressors of parenting a third child. MOB denied that she had been coerced into the decision and expressed that she is not in a good place to parent a third child. She stated  that her wish is for her daughter to be loved and for. MOB explained that she did not want to parent if she could not be emotionally healthy for her daughter.  MOB expressed that she is at peace with her decision. CSW acknowledged MOB's decision and bravery to place her baby up for adoption. MOB shared that she had been working with [employer] since June and had built a good rapport with the agency. She reported that the agency had been very supportive during her pregnancy. MOB reported that she had chosen to do a semi-open adoption so that she will continue to receive updates about her daughter and so her daughter may know her biological parents and family. MOB reported that she had reached out to Paac Ciinak with [employer] and notified them that she wanted to proceed with the adoption. CSW  informed MOB that CSW would be available to offer support while she remains in the hospital and MOB was receptive to the support. CSW informed MOB that CSW would also contact the adoption agency and help facilitate the process. MOB verbalized understanding and thanked CSW.  CSW inquired about MOB's mental health history. MOB reported that she had postpartum depression previously with her daughter. She described her symptoms as sleeping for days and easily angered. She mentioned having stressors that contributed to her symptoms including parenting her daughter alone. MOB reported that she briefly saw a therapist but did not continue sessions because she was in denial about her emotions. MOB expressed having a history of depression and anxiety symptoms and believes that she may also have undiagnosed bipolar disorder. She explained experiencing period of feeling "untouchable on top of the world" followed by days of feeling depressed and she is easily triggered and angered. She expressed self-awareness of these concerns and believes she has an emotional imbalance. CSW provided active listening and acknowledged MOB's feelings and concerns. CSW provided review/ education regarding the baby blues period vs. perinatal mood disorders, discussed and encouraged treatment. MOB reported that she is currently seeing Integrated Behavioral Health counselor which she feels has been very helpful and her next appointment is scheduled on the October 8th. CSW discussed psychiatry services, additional services and provided information for [facility] services. She expressed interest and stated she would follow-up. CSW informed MOB that the information would be included on the AVS. MOB expressed appreciation.  CSW recommended MOB complete a self-evaluation during the postpartum time period using the New Mom Checklist from Postpartum Progress and encouraged MOB to contact a medical professional  if symptoms are noted at any time. CSW assessed MOB for safety. MOB denied thoughts of harming herself and other. MOB denied domestic violence concerns.  CSW informed MOB that she must contact the agency if she wishes to revoke. MOB verbalized understanding. CSW assessed MOB for additional needs. MOB reported none.  CSW contacted [employer] and confirmed Retail banker would meet with MOB today to complete paperwork.     CSW Plan/Description:  Perinatal Mood and Anxiety Disorder (PMADs) Education, Other Information/Referral to [employer], LCSW 10/15/2023, 12:12 PM

## 2023-10-15 NOTE — Plan of Care (Signed)
  Problem: Education: Goal: Knowledge of General Education information will improve Description: Including pain rating scale, medication(s)/side effects and non-pharmacologic comfort measures Outcome: Progressing   Problem: Health Behavior/Discharge Planning: Goal: Ability to manage health-related needs will improve Outcome: Progressing   Problem: Clinical Measurements: Goal: Ability to maintain clinical measurements within normal limits will improve Outcome: Progressing Goal: Will remain free from infection Outcome: Progressing Goal: Diagnostic test results will improve Outcome: Progressing Goal: Respiratory complications will improve Outcome: Progressing Goal: Cardiovascular complication will be avoided Outcome: Progressing   Problem: Activity: Goal: Risk for activity intolerance will decrease Outcome: Progressing   Problem: Nutrition: Goal: Adequate nutrition will be maintained Outcome: Progressing   Problem: Coping: Goal: Level of anxiety will decrease Outcome: Progressing   Problem: Elimination: Goal: Will not experience complications related to bowel motility Outcome: Progressing Goal: Will not experience complications related to urinary retention Outcome: Progressing   Problem: Pain Managment: Goal: General experience of comfort will improve and/or be controlled Outcome: Progressing   Problem: Safety: Goal: Ability to remain free from injury will improve Outcome: Progressing   Problem: Skin Integrity: Goal: Risk for impaired skin integrity will decrease Outcome: Progressing   Problem: Coping: Goal: Ability to verbalize concerns and feelings about labor and delivery will improve Outcome: Progressing   Problem: Education: Goal: Knowledge of condition will improve Outcome: Progressing Goal: Individualized Educational Video(s) Outcome: Progressing   Problem: Activity: Goal: Will verbalize the importance of balancing activity with adequate rest  periods Outcome: Progressing Goal: Ability to tolerate increased activity will improve Outcome: Progressing   Problem: Coping: Goal: Ability to identify and utilize available resources and services will improve Outcome: Progressing   Problem: Life Cycle: Goal: Chance of risk for complications during the postpartum period will decrease Outcome: Progressing   Problem: Skin Integrity: Goal: Demonstration of wound healing without infection will improve Outcome: Progressing

## 2023-10-15 NOTE — Lactation Note (Signed)
 This note was copied from a baby's chart. Lactation Consultation Note  Patient Name: [name] [name] Unijb'd Date: 10/15/2023 Age:36 hours  Mom chooses to formula feed.  Maternal Data    Feeding Nipple Type: Slow - flow  LATCH Score                    Lactation Tools Discussed/Used    Interventions    Discharge    Consult Status Consult Status: Complete    Samah Lapiana G 10/15/2023, 4:40 AM

## 2023-10-15 NOTE — Social Work (Signed)
 CSW escorted [employer] representative Raguel Stark to MOB's room 1S09.   Nat Quiet, MSW, LCSW Clinical Social Worker  [phone] 10/15/2023  1:26 PM

## 2023-10-15 NOTE — Progress Notes (Signed)
 Labor Progress Note [name] [name] is a 38 y.o. G3P2002 at [redacted]w[redacted]d presented for IOL 2/2 FGR S:  Pt seen for AROM, discussed procedure in detail and checked for vertex positioning.    AROM with FSE prong, fluid allowed to escape slowly and for head to become well applied.  No pulsatile tissue detected O:  BP (!) 145/72 Comment: contracting  Pulse (!) 57   Temp 99.2 F (37.3 C) (Axillary)   Resp 16   Ht 5' 2 (1.575 m)   Wt 73.2 kg   LMP 01/27/2023 (Exact Date)   SpO2 97%   BMI 29.52 kg/m  EFM: baseline 130s/moderate variability/repetitive variable decels, cat 2  CVE: Dilation: 4 Effacement (%): 60 Cervical Position: Middle Station: -2, -3 Presentation: Vertex Exam by:: Dr. Zina   A&P: 38 y.o. [id number] [redacted]w[redacted]d  Monitor strip for continued variables, may need amnioinfusion. Pain control per patient preference.  [name], MD 12:43 AM

## 2023-10-15 NOTE — Anesthesia Preprocedure Evaluation (Signed)
 Anesthesia Evaluation    Reviewed: Allergy & Precautions, H&P , Patient's Chart, lab work & pertinent test results  Airway        Dental   Pulmonary neg pulmonary ROS, former smoker          Cardiovascular Exercise Tolerance: Good negative cardio ROS      Neuro/Psych negative neurological ROS  negative psych ROS   GI/Hepatic negative GI ROS, Neg liver ROS,,,  Endo/Other  negative endocrine ROS    Renal/GU negative Renal ROS  negative genitourinary   Musculoskeletal   Abdominal   Peds  Hematology  (+) Blood dyscrasia, anemia   Anesthesia Other Findings   Reproductive/Obstetrics negative OB ROS                              Anesthesia Physical Anesthesia Plan  ASA: 2  Anesthesia Plan: General   Post-op Pain Management: Minimal or no pain anticipated, Tylenol  PO (pre-op)* and Celebrex PO (pre-op)*   Induction: Intravenous  PONV Risk Score and Plan: 3 and Ondansetron , Dexamethasone  and Treatment may vary due to age or medical condition  Airway Management Planned: Oral ETT  Additional Equipment: None  Intra-op Plan:   Post-operative Plan: Extubation in OR  Informed Consent: I have reviewed the patients History and Physical, chart, labs and discussed the procedure including the risks, benefits and alternatives for the proposed anesthesia with the patient or authorized representative who has indicated his/her understanding and acceptance.       Plan Discussed with: Anesthesiologist and CRNA  Anesthesia Plan Comments: (  )         Anesthesia Quick Evaluation

## 2023-10-15 NOTE — Social Work (Signed)
 CSW obtained a copy of the Relinquishment of Minor for Adoption by parent or Guardian or Ad litem of the Mother/Father signed by birth mother and placed in infant's chart.  CSW obtained a copy of the Acceptance of Relinquishment of Minor for Adoptions by Parent of Guardian or Guardian Ad Litem of the Mother/Father signed by the agency and placed in infant's chart. CSW obtained a copy of the Request and Authorization for Use/Disclosure of Protected Health Information signed by Mother for herself and infant's chart.  -[employer] has custody of the baby. The agency makes all medical decisions for the baby while in the hospital.  -At discharge: RN Please obtain a copy of the agency representative's ID and file in baby's chart with a sticker [employer] can reached at: [phone] Nat Quiet, MSW, LCSW Clinical Social Worker  [phone] 10/15/2023  4:40 PM

## 2023-10-16 MED ORDER — IBUPROFEN 600 MG PO TABS: 600.0000 mg | ORAL_TABLET | Freq: Four times a day (QID) | ORAL | 0 refills | Status: AC

## 2023-10-16 MED ORDER — INFLUENZA VIRUS VACC SPLIT PF (FLUZONE) 0.5 ML IM SUSY: 0.5000 mL | PREFILLED_SYRINGE | INTRAMUSCULAR | Status: DC

## 2023-10-16 MED ORDER — INFLUENZA VIRUS VACC SPLIT PF (FLUZONE) 0.5 ML IM SUSY
0.5000 mL | PREFILLED_SYRINGE | Freq: Once | INTRAMUSCULAR | Status: AC
  Administered 2023-10-16: 0.5 mL via INTRAMUSCULAR
  Filled 2023-10-16: qty 0.5

## 2023-10-16 NOTE — Progress Notes (Signed)
 CSW was consulted due to [facility] answering hardly ever to question 10 on [place] questionnaire (endorsing thoughts of self harm). CSW met with MOB to assess and provide support. When CSW entered room, MOB was observed laying in hospital bed. MOB had a visitor present who she identified as her boyfriend. CSW introduced self and requested to speak with MOB alone. Visitor left the room. CSW explained reason for visit. MOB presented as calm, welcomed CSW visit, and remained engaged during encounter. MOB was observed as appropriately tearful while discussing adoption of infant but was able to return to a calm baseline during encounter. MOB did not display any acute mental health signs or symptoms during assessment.   CSW inquired how MOB is feeling emotionally and reviewed MOB's Edinburgh responses. MOB shared she is still processing her decision to place infant for adoption and referred to her decision as bittersweet but more sweet than bitter. MOB shared about her experience of postpartum depression following the birth of her now 7 year-old daughter and ongoing mental health challenges, which contributed towards her decision to place infant for adoption. CSW provided active listening and emotional support while MOB verbally processed her feelings surrounding the adoption process. MOB shared that she feels she made the right decision placing infant for adoption and feels purpose and motivation behind parenting her other two children, who she identified as protective factors and reasons to live. MOB confirmed she is aware that she has 7 days to revoke a relinquishment in the state of [place].  CSW inquired about MOB answering hardly ever to endorsing thoughts of self harm. MOB denied current SI/HI and reported the last time she endorsed suicidal ideation was 1 month ago when she experienced unexpected health complications during her pregnancy, marked by needing 5 separate iron  infusions. MOB explained how the added  stress of missing work for doctor's appointments and not knowing if she would go into labor earlier than anticipated led to feelings of overwhelm and guilt. MOB recalled endorsing thoughts of passive suicidal ideation during this time 1 month ago and denied having a plan or intent.   CSW inquired about supports. MOB states she will be staying with her sister following discharge who she feels comfortable talking openly with. MOB states her son will be staying with his dad and her daughter will be with her mother so that MOB can focus on recovery for the next 2 weeks. MOB identified her son's father and mother as supportive, along with her boyfriend. MOB states she will be able to see her children when she feels emotionally ready to do so. MOB confirmed she also has an appointment with Integrated Behavioral Health Therapist, [place] at [facility] 10/20/23. CSW inquired if MOB has attempted suicide in the past. MOB reports 1 prior suicide attempt, stating she drank poison at age 38 and was taken to the hospital. MOB denied having firearms in her home.   CSW inquired about coping skills and encouraged MOB to prioritize rest and self care as she recovers postpartum. MOB identified spending time with her sister, walking her sister's dog, listening to music, playing word games on her phone, and doing crossword and word searches as helpful coping skills. CSW reviewed crisis mental health resources with MOB and confirmed crisis resources are provided on MOB's after visit summary.   MOB denied additional needs at this time and states her boyfriend will provide her with transportation to her sister's home at discharge.   Signed,  Sharyne LOIS Roulette, MSW,  LCSWA, LCASA 10/16/2023 4:20 PM

## 2023-10-16 NOTE — Discharge Summary (Signed)
 Postpartum Discharge Summary      Patient Name: [name] [name] DOB: 1985/03/06 MRN: [id number]  Date of admission: 10/14/2023 Delivery date:10/15/2023 Delivering provider: ZINA JERILYNN LABOR Date of discharge: 10/16/2023  Admitting diagnosis: IUGR (intrauterine growth restriction) affecting care of mother [O36.5990] Intrauterine pregnancy: [redacted]w[redacted]d     Secondary diagnosis:  Principal Problem:   IUGR (intrauterine growth restriction) affecting care of mother Active Problems:   Thrombocytopenia, unspecified (HCC)   AMA (advanced maternal age) multigravida 35+, second trimester   History of postpartum depression   Late prenatal care affecting pregnancy in second trimester   Unwanted fertility   Gestational thrombocytopenia  Additional problems: None    Discharge diagnosis: Term Pregnancy Delivered and fetal growth restriction,AMA, late prenatal care                                             Post partum procedures:None Augmentation: AROM, Pitocin , and Cytotec  Complications: None  Hospital course: Induction of Labor With Vaginal Delivery   38 y.o. yo G3P3003 at [redacted]w[redacted]d was admitted to the hospital 10/14/2023 for induction of labor.  Indication for induction: Fetal growth restriction.  Patient had an labor course complicated by none Membrane Rupture Time/Date: 12:24 AM,10/15/2023  Delivery Method:Vaginal, Spontaneous Operative Delivery:N/A Episiotomy: None Lacerations:  None Details of delivery can be found in separate delivery note.  Patient had a postpartum course complicated by none. Patient is discharged home 10/16/23.  Newborn Data: Birth date:10/15/2023 Birth time:1:00 AM Gender:Female Living status:Living Apgars:8 ,9  Weight:2420 g  Magnesium Sulfate received: No BMZ received: No Rhophylac:N/A MMR:N/A T-DaP:Given prenatally Flu: N/A RSV Vaccine received: No Transfusion:No  Immunizations received: Immunization History  Administered Date(s) Administered   DTP  02/25/1988, 05/27/1988, 09/17/1988, 09/15/1989, 02/26/1992   HIB (PRP-OMP) 01/29/1989   Hepatitis A 07/05/2006   Hepatitis B 11/27/2000, 12/27/2000, 04/18/2001   Hpv-Unspecified 07/05/2006   Influenza Whole 02/14/2010   Influenza, Seasonal, Injecte, Preservative Fre 10/20/2013   Influenza,inj,Quad PF,6+ Mos 01/30/2020   MMR 01/29/1989, 02/26/1992   Meningococcal polysaccharide vaccine (MPSV4) 07/05/2006   OPV 02/25/1988, 05/27/1988, 01/29/1989, 02/26/1992   PFIZER(Purple Top)SARS-COV-2 Vaccination 06/28/2019, 07/19/2019   Td 07/05/2006   Tdap 10/20/2013, 03/26/2020, 06/12/2020, 09/22/2023    Physical exam  Vitals:   10/15/23 1603 10/15/23 1946 10/16/23 0011 10/16/23 0800  BP: 116/66 125/78 111/69 103/76  Pulse: 60 76 77 63  Resp: 17 16 16 17   Temp: 97.8 F (36.6 C) 98 F (36.7 C) 97.7 F (36.5 C) 97.8 F (36.6 C)  TempSrc: Oral Oral Oral Oral  SpO2: 100% 100% 99% 100%  Weight:      Height:       General: alert, cooperative, and no distress Lochia: appropriate Uterine Fundus: firm Incision: Healing well with no significant drainage DVT Evaluation: No evidence of DVT seen on physical exam. Labs: Lab Results  Component Value Date   WBC 15.5 (H) 10/15/2023   HGB 10.6 (L) 10/15/2023   HCT 32.0 (L) 10/15/2023   MCV 85.8 10/15/2023   PLT 83 (L) 10/15/2023      Latest Ref Rng & Units 12/27/2019    7:39 PM  CMP  Glucose 70 - 99 mg/dL 98   BUN 6 - 20 mg/dL 9   Creatinine 9.55 - 8.99 mg/dL 9.32   Sodium 864 - 854 mmol/L 135   Potassium 3.5 - 5.1 mmol/L 3.2   Chloride  98 - 111 mmol/L 105   CO2 22 - 32 mmol/L 21   Calcium  8.9 - 10.3 mg/dL 8.9   Total Protein 6.5 - 8.1 g/dL 7.1   Total Bilirubin 0.3 - 1.2 mg/dL 0.3   Alkaline Phos 38 - 126 U/L 40   AST 15 - 41 U/L 19   ALT 0 - 44 U/L 16    Edinburgh Score:    10/16/2023    9:00 AM  Edinburgh Postnatal Depression Scale Screening Tool  I have been able to laugh and see the funny side of things. --   No data  recorded  After visit meds:  Allergies as of 10/16/2023       Reactions   Sulfonamide Derivatives Hives        Medication List     TAKE these medications    ibuprofen  600 MG tablet Commonly known as: ADVIL  Take 1 tablet (600 mg total) by mouth every 6 (six) hours.   PRENATAL GUMMIES PO Take 2 tablets by mouth daily.   Vitamin D  (Ergocalciferol ) 1.25 MG (50000 UNIT) Caps capsule Commonly known as: DRISDOL  Take 1 capsule (50,000 Units total) by mouth every 7 (seven) days for 12 doses.         Discharge home in stable condition Infant Feeding: Bottle Infant Disposition:Baby up for adoption Discharge instruction: per After Visit Summary and Postpartum booklet. Activity: Advance as tolerated. Pelvic rest for 6 weeks.  Diet: routine diet Future Appointments: Future Appointments  Date Time Provider Department Center  10/20/2023  8:15 AM [facility] CLINICIAN [facility]  11/11/2023  2:35 PM Eveline Lynwood MATSU, MD [facility]  03/07/2024 10:30 AM [facility] None  03/07/2024 11:00 AM Loretha Ash, MD [facility] None   Follow up Visit:  Follow-up Information     [facility] Follow up.   Specialty: Urgent Care Why: [facility]. you must arrive by 7:00am to be seen. Please go to the second floor, take a clipboard and complete forms. You will be called in the order that you arrive.   For questions please call: Phone: [phone] Contact information: [address] [place]  [phone]        Center for [facility] Follow up in 4 week(s).   Specialty: Obstetrics and Gynecology Why: pp check, they will call you with an appointment Contact information: [address] [place]  [address] [phone]                 Please schedule this patient for a In person postpartum visit in 4 weeks with the following  provider: Any provider. Additional Postpartum F/U:Postpartum Depression checkup  High risk pregnancy complicated by: Fetal growth restriction Delivery mode:  Vaginal, Spontaneous Anticipated Birth Control:  Unsure   10/16/2023 Glenys GORMAN Birk, MD

## 2023-10-18 ENCOUNTER — Encounter: Payer: Self-pay | Admitting: Obstetrics and Gynecology

## 2023-10-18 LAB — SURGICAL PATHOLOGY

## 2023-10-20 ENCOUNTER — Ambulatory Visit (INDEPENDENT_AMBULATORY_CARE_PROVIDER_SITE_OTHER): Admitting: Clinical

## 2023-10-20 DIAGNOSIS — F4323 Adjustment disorder with mixed anxiety and depressed mood: Secondary | ICD-10-CM | POA: Diagnosis not present

## 2023-10-20 NOTE — Patient Instructions (Signed)
 Center for [facility] [address] [place], [place] [address] [phone] (main office) [phone] [facility] office)  New Parent Support Groups [email or web address]

## 2023-10-20 NOTE — BH Specialist Note (Signed)
 Integrated Behavioral Health via Telemedicine Visit  10/20/2023 SARAHY CREEDON [id number]  Number of Integrated Behavioral Health Clinician visits: 2- Second Visit  Session Start time: 9176   Session End time: 0855  Total time in minutes: 32  Referring Provider: Norleen Rover, MD Patient/Family location: Home  [facility] Provider location: [facility]  All persons participating in visit: Patient Anvi Mangal and [facility] Channon Brougher   Types of Service: Individual psychotherapy and Video visit  I connected with Tita LITTIE Metro and/or Tita LITTIE Viviani's n/a via  Telephone or [employer]  (Video is Caregility application) and verified that I am speaking with the correct person using two identifiers. Discussed confidentiality: Yes   I discussed the limitations of telemedicine and the availability of in person appointments.  Discussed there is a possibility of technology failure and discussed alternative modes of communication if that failure occurs.  I discussed that engaging in this telemedicine visit, they consent to the provision of behavioral healthcare and the services will be billed under their insurance.  Patient and/or legal guardian expressed understanding and consented to Telemedicine visit: Yes   Presenting Concerns: Patient and/or family reports the following symptoms/concerns: Processing birth and decision to keep daughter, rather than adoption. Pt took time at home after discharge from hospital to reflect upon the best decision for her daughter and family; decided to be open to support and open with her own emotions with others, is sleeping when baby sleeps, has good appetite, and family/friends have been very supportive of either decision. Pt will return to work after three weeks postpartum; family available to keep baby until she gets into daycare with her big sister.  Duration of problem:  Postpartum; Severity of problem: moderate  Patient and/or Family's Strengths/Protective Factors: Social connections, Concrete supports in place (healthy food, safe environments, etc.), and Sense of purpose  Goals Addressed: Patient will:  Reduce symptoms of: anxiety, depression, and stress   Increase knowledge and/or ability of: healthy habits   Demonstrate ability to: Increase healthy adjustment to current life circumstances and Increase adequate support systems for patient/family  Progress towards Goals: Ongoing    Interventions: Interventions utilized:  Functional Assessment of ADLs, Psychoeducation and/or Health Education, Link to [employer], and Supportive Reflection Standardized Assessments completed: Not Needed    Patient and/or Family Response: Patient agrees with treatment plan.   Clinical Assessment/Diagnosis  Adjustment disorder with mixed anxiety and depressed mood   Patient may benefit from continued therapeutic intervention  Plan: Follow up with behavioral health clinician on : Two weeks Behavioral recommendations:  -Continue prioritizing healthy self-care (regular meals, adequate rest; allowing practical help from supportive friends and family) until at least postpartum medical appointment -Consider new mom support group as needed at either [email or web address].net or [email or web address]  -Continue plan to contact daughter's [employer] Program to find out about baby getting in as soon as able Referral(s): Integrated Art gallery manager (In Clinic) and [employer]:  New mom support  I discussed the assessment and treatment plan with the patient and/or parent/guardian. They were provided an opportunity to ask questions and all were answered. They agreed with the plan and demonstrated an understanding of the instructions.   They were advised to call back or seek an in-person evaluation if the symptoms worsen or if the condition fails to improve  as anticipated.  Kahliya Fraleigh C Taevion Sikora, LCSW     09/07/2023    3:40 PM 08/02/2023    9:56 AM  08/29/2020   11:56 AM 07/04/2020    5:10 PM 06/11/2020    4:55 PM  Depression screen PHQ 2/9  Decreased Interest 0 0 0 0 0  Down, Depressed, Hopeless 0 0 0 0 0  PHQ - 2 Score 0 0 0 0 0  Altered sleeping  0 0 0 0  Tired, decreased energy  0 0 0 0  Change in appetite  0 0 0 0  Feeling bad or failure about yourself   0 0 0 0  Trouble concentrating  0 0 1 0  Moving slowly or fidgety/restless  0 0 0 0  Suicidal thoughts  0 0 0 0  PHQ-9 Score  0 0 1 0  Difficult doing work/chores   Not difficult at all        08/02/2023    9:57 AM 08/29/2020   11:57 AM 07/04/2020    5:11 PM 06/11/2020    4:55 PM  GAD 7 : Generalized Anxiety Score  Nervous, Anxious, on Edge 0 0 0 1  Control/stop worrying 0 0 0 0  Worry too much - different things 0 0 0 0  Trouble relaxing 0 0 0 1  Restless 0 0 0 0  Easily annoyed or irritable 0 0 1 1  Afraid - awful might happen 0 0 0 0  Total GAD 7 Score 0 0 1 3

## 2023-10-25 ENCOUNTER — Encounter: Payer: Self-pay | Admitting: Obstetrics and Gynecology

## 2023-10-26 ENCOUNTER — Telehealth (HOSPITAL_COMMUNITY): Payer: Self-pay

## 2023-10-26 NOTE — Telephone Encounter (Signed)
 10/26/2023 1439  Name: LAIYA WISBY MRN: [id number] DOB: 1985/02/19  Reason for Call:  Transition of Care Hospital Discharge Call  Contact Status: Patient Contact Status: Complete  Language assistant needed:          Follow-Up Questions: Do You Have Any Concerns About Your Health As You Heal From Delivery?: No Do You Have Any Concerns About Your Infants Health?: No (Patient reports that she is breastfeeding and pumping. Patient states that baby is feeding well. No questions or concerns.)  Edinburgh Postnatal Depression Scale:  In the Past 7 Days: I have been able to laugh and see the funny side of things.: As much as I always could I have looked forward with enjoyment to things.: As much as I ever did I have blamed myself unnecessarily when things went wrong.: Not very often I have been anxious or worried for no good reason.: No, not at all I have felt scared or panicky for no good reason.: No, not at all Things have been getting on top of me.: No, most of the time I have coped quite well I have been so unhappy that I have had difficulty sleeping.: Not very often I have felt sad or miserable.: No, not at all I have been so unhappy that I have been crying.: No, never The thought of harming myself has occurred to me.: Never Edinburgh Postnatal Depression Scale Total: 3  PHQ2-9 Depression Scale:     Discharge Follow-up: Edinburgh score requires follow up?: No  Post-discharge interventions: Reviewed Newborn Safe Sleep Practices  Signature  Rosaline Deretha PEAK

## 2023-11-01 ENCOUNTER — Encounter: Payer: Self-pay | Admitting: Family Medicine

## 2023-11-02 NOTE — BH Specialist Note (Unsigned)
 Integrated Behavioral Health via Telemedicine Visit  11/03/2023 [name] [id number]  Number of Integrated Behavioral Health Clinician visits: 3- Third Visit  Session Start time: 0917   Session End time: 0940  Total time in minutes: 23    Referring Provider: Norleen Rover, MD Patient/Family location: Home [facility] Provider location: [facility] at [facility]  All persons participating in visit: Patient [name] [name] and [facility] Duey Liller   Types of Service: Individual psychotherapy and Video visit  I connected with Tita LITTIE Metro and/or Tita LITTIE Saunders's n/a via  Telephone or [employer]  (Video is Caregility application) and verified that I am speaking with the correct person using two identifiers. Discussed confidentiality: Yes   I discussed the limitations of telemedicine and the availability of in person appointments.  Discussed there is a possibility of technology failure and discussed alternative modes of communication if that failure occurs.  I discussed that engaging in this telemedicine visit, they consent to the provision of behavioral healthcare and the services will be billed under their insurance.  Patient and/or legal guardian expressed understanding and consented to Telemedicine visit: Yes   Presenting Concerns: Patient and/or family reports the following symptoms/concerns: Adjusting well to return to work postpartum, attributes to increasing priority on healthy self-care, accepting support from family/friends as needed, and setting small goals for task completion at home without feeling overwhelmed.  Duration of problem: Postpartum  Patient and/or Family's Strengths/Protective Factors: Social connections, Social and Emotional competence, Concrete supports in place (healthy food, safe environments, etc.), Sense of purpose, and Physical Health (exercise, healthy diet, medication  compliance, etc.)  Goals Addressed: Patient will:  Maintain symptoms of: anxiety and depression   Maintain ability to: Increase healthy adjustment to current life circumstances  Progress towards Goals: Achieved    Interventions: Interventions utilized:  Supportive Reflection Standardized Assessments completed: Not Needed    Patient and/or Family Response: Patient agrees with treatment plan.   Clinical Assessment/Diagnosis  Adjustment disorder with mixed anxiety and depressed mood   Patient may benefit from continued therapeutic intervention today.  Plan: Follow up with behavioral health clinician on : Call Jaleea Alesi at [phone], as needed. Behavioral recommendations:  -Continue prioritizing healthy self-care (regular meals, adequate rest; allowing practical help from supportive friends and family; setting small, achievable goals daily) until at least postpartum medical appointment -Consider new mom support group as needed at either [email or web address].net or [email or web address]   Referral(s): Community Resources:  new mom support  I discussed the assessment and treatment plan with the patient and/or parent/guardian. They were provided an opportunity to ask questions and all were answered. They agreed with the plan and demonstrated an understanding of the instructions.   They were advised to call back or seek an in-person evaluation if the symptoms worsen or if the condition fails to improve as anticipated.  [name], LCSW     09/07/2023    3:40 PM 08/02/2023    9:56 AM 08/29/2020   11:56 AM 07/04/2020    5:10 PM 06/11/2020    4:55 PM  Depression screen PHQ 2/9  Decreased Interest 0 0 0 0 0  Down, Depressed, Hopeless 0 0 0 0 0  PHQ - 2 Score 0 0 0 0 0  Altered sleeping  0 0 0 0  Tired, decreased energy  0 0 0 0  Change in appetite  0 0 0 0  Feeling bad or failure about yourself   0 0 0 0  Trouble concentrating  0 0 1 0  Moving slowly or fidgety/restless  0 0  0 0  Suicidal thoughts  0 0 0 0  PHQ-9 Score  0 0 1 0  Difficult doing work/chores   Not difficult at all        08/02/2023    9:57 AM 08/29/2020   11:57 AM 07/04/2020    5:11 PM 06/11/2020    4:55 PM  GAD 7 : Generalized Anxiety Score  Nervous, Anxious, on Edge 0 0 0 1  Control/stop worrying 0 0 0 0  Worry too much - different things 0 0 0 0  Trouble relaxing 0 0 0 1  Restless 0 0 0 0  Easily annoyed or irritable 0 0 1 1  Afraid - awful might happen 0 0 0 0  Total GAD 7 Score 0 0 1 3      10/26/2023    2:26 PM 10/16/2023   11:37 AM 10/16/2023    9:00 AM 07/04/2020    4:09 PM 06/12/2020   12:10 AM  Edinburgh Postnatal Depression Scale Screening Tool  I have been able to laugh and see the funny side of things. 0  0  --  0  0   I have looked forward with enjoyment to things. 0  0   0  0   I have blamed myself unnecessarily when things went wrong. 1  1   0  1   I have been anxious or worried for no good reason. 0  1   1  1    I have felt scared or panicky for no good reason. 0  1   0  0   Things have been getting on top of me. 1  1   0  1   I have been so unhappy that I have had difficulty sleeping. 1  1   0  0   I have felt sad or miserable. 0  1   0  1   I have been so unhappy that I have been crying. 0  1   0  1   The thought of harming myself has occurred to me. 0  1   0  0   Edinburgh Postnatal Depression Scale Total 3 8  1  5       Data saved with a previous flowsheet row definition

## 2023-11-03 ENCOUNTER — Ambulatory Visit: Admitting: Clinical

## 2023-11-03 DIAGNOSIS — F4323 Adjustment disorder with mixed anxiety and depressed mood: Secondary | ICD-10-CM

## 2023-11-03 NOTE — Patient Instructions (Signed)
 Center for [facility] [address] [place], [place] [address] [phone] (main office) [phone] [facility] office)  New Parent Support Groups [email or web address]

## 2023-11-08 ENCOUNTER — Encounter: Payer: Self-pay | Admitting: Family Medicine

## 2023-11-09 ENCOUNTER — Ambulatory Visit: Admitting: Family Medicine

## 2023-11-09 ENCOUNTER — Encounter: Payer: Self-pay | Admitting: Family Medicine

## 2023-11-09 NOTE — Progress Notes (Signed)
Patient did not keep appointment today. She will be called to reschedule.  

## 2023-11-11 ENCOUNTER — Ambulatory Visit: Admitting: Obstetrics & Gynecology

## 2023-11-16 ENCOUNTER — Encounter: Payer: Self-pay | Admitting: Certified Nurse Midwife

## 2023-11-16 ENCOUNTER — Other Ambulatory Visit: Payer: Self-pay

## 2023-11-16 ENCOUNTER — Ambulatory Visit (INDEPENDENT_AMBULATORY_CARE_PROVIDER_SITE_OTHER): Admitting: Certified Nurse Midwife

## 2023-11-16 ENCOUNTER — Ambulatory Visit: Admitting: Obstetrics and Gynecology

## 2023-11-16 ENCOUNTER — Other Ambulatory Visit (HOSPITAL_COMMUNITY)
Admission: RE | Admit: 2023-11-16 | Discharge: 2023-11-16 | Disposition: A | Discharge status: Home / Self Care | Source: Ambulatory Visit | Attending: Certified Nurse Midwife | Admitting: Certified Nurse Midwife

## 2023-11-16 DIAGNOSIS — Z01419 Encounter for gynecological examination (general) (routine) without abnormal findings: Secondary | ICD-10-CM | POA: Insufficient documentation

## 2023-11-16 DIAGNOSIS — Z30014 Encounter for initial prescription of intrauterine contraceptive device: Secondary | ICD-10-CM

## 2023-11-16 DIAGNOSIS — Z113 Encounter for screening for infections with a predominantly sexual mode of transmission: Secondary | ICD-10-CM | POA: Diagnosis present

## 2023-11-16 DIAGNOSIS — Z3009 Encounter for other general counseling and advice on contraception: Secondary | ICD-10-CM

## 2023-11-16 DIAGNOSIS — Z3202 Encounter for pregnancy test, result negative: Secondary | ICD-10-CM

## 2023-11-16 DIAGNOSIS — Z124 Encounter for screening for malignant neoplasm of cervix: Secondary | ICD-10-CM

## 2023-11-16 LAB — POCT PREGNANCY, URINE: Preg Test, Ur: NEGATIVE

## 2023-11-16 MED ORDER — LEVONORGESTREL 20 MCG/DAY IU IUD
1.0000 | INTRAUTERINE_SYSTEM | Freq: Once | INTRAUTERINE | Status: AC
  Administered 2023-11-16: 1 via INTRAUTERINE

## 2023-11-16 NOTE — Addendum Note (Signed)
 Addended by: LENNIE RONCO T on: 11/16/2023 03:34 PM   Modules accepted: Orders

## 2023-11-16 NOTE — Progress Notes (Signed)
 Post Partum Visit Note  [name] [name] is a 38 y.o. G9P3003 female who presents for a postpartum visit. She is 4.4 weeks postpartum following a normal spontaneous vaginal delivery.  I have fully reviewed the prenatal and intrapartum course. The delivery was at 37.2 gestational weeks.  Anesthesia: none. Postpartum course has been good. Baby is doing well. Baby is feeding by both breast and bottle - Similac Advance. Bleeding no bleeding. Bowel function is normal. Bladder function is normal. Patient is not sexually active. Contraception method is none. Postpartum depression screening: negative.   The pregnancy intention screening data noted above was reviewed. Potential methods of contraception were discussed. The patient elected to proceed with No data recorded.   Edinburgh Postnatal Depression Scale - 11/16/23 1428       Edinburgh Postnatal Depression Scale:  In the Past 7 Days   I have been able to laugh and see the funny side of things. 0    I have looked forward with enjoyment to things. 0    I have blamed myself unnecessarily when things went wrong. 0    I have been anxious or worried for no good reason. 0    I have felt scared or panicky for no good reason. 0    Things have been getting on top of me. 0    I have been so unhappy that I have had difficulty sleeping. 0    I have felt sad or miserable. 0    I have been so unhappy that I have been crying. 0    The thought of harming myself has occurred to me. 0    Edinburgh Postnatal Depression Scale Total 0          Health Maintenance Due  Topic Date Due   HPV VACCINES (2 - 3-dose series) 08/02/2006   COVID-19 Vaccine (3 - 2025-26 season) 09/13/2023    The following portions of the patient's history were reviewed and updated as appropriate: allergies, current medications, past family history, past medical history, past social history, past surgical history, and problem list.  Review of Systems Pertinent items are noted in  HPI.  Objective:  BP 122/89   Pulse 61   Wt 150 lb 12.8 oz (68.4 kg)   LMP 01/27/2023 (Exact Date)   Breastfeeding Yes   BMI 27.58 kg/m    General:  alert, cooperative, appears stated age, and no distress   Breasts:  Lactating, deferred  Lungs: clear to auscultation bilaterally  Heart:  regular rate and rhythm, S1, S2 normal, no murmur, click, rub or gallop  Abdomen: soft, non-tender; bowel sounds normal; no masses,  no organomegaly   Wound NA  GU exam:  abnormal , copious malodorous thin grey vaginal discharge       Assessment:    There are no diagnoses linked to this encounter.  Overall benign postpartum exam.   Plan:   Essential components of care per ACOG recommendations:  1.  Mood and well being: Patient with negative depression screening today. Reviewed local resources for support.  - Patient tobacco use? No.   - hx of drug use? No.    2. Infant care and feeding:  -Patient currently breastmilk feeding? Yes. Discussed returning to work and pumping. Reviewed importance of draining breast regularly to support lactation.  -Social determinants of health (SDOH) reviewed in EPIC. No concerns  3. Sexuality, contraception and birth spacing - Patient does not want a pregnancy in the next year.  Desired family size  is 3 children.  - Reviewed reproductive life planning. Reviewed contraceptive methods based on pt preferences and effectiveness.  Patient desired IUD or IUS today.   - Discussed birth spacing of 18 months  4. Sleep and fatigue -Encouraged family/partner/community support of 4 hrs of uninterrupted sleep to help with mood and fatigue  5. Physical Recovery  - Discussed patients delivery and complications. She describes her labor as good. - Patient had a Vaginal, no problems at delivery. Patient had  no laceration. Perineal healing reviewed. Patient expressed understanding - Patient has urinary incontinence? No. - Patient is safe to resume physical and sexual  activity  6.  Health Maintenance - HM due items addressed Yes - Last pap smear  Diagnosis  Date Value Ref Range Status  01/01/2020   Final   - Negative for Intraepithelial Lesions or Malignancy (NILM)  01/01/2020 - Benign reactive/reparative changes  Final   Pap smear done at today's visit.  -Breast Cancer screening indicated? No.   7. Chronic Disease/Pregnancy Condition follow up: None  - PCP follow up  1. Postpartum care and examination (Primary) - WNL  2. Screening for cervical cancer - Pap today  3. Well woman exam - Pap and PE today.   4. Encounter for initial prescription of intrauterine contraceptive device (IUD) - She was counseled regarding the risks/benefits of IUD including insertion risk of infection, hemorrhage, damage to surrounding tissue and organs, uterine perforation. She was counseled regarding risks of IUD including implantation into uterine wall, migration outside of uterus, possible need for hysteroscopic or laparoscopic removal, ovarian cysts, expulsion. She was advised that risk of pregnancy is low with negative UPT but is not zero and IUD insertion may cause miscarriage. Reviewed that she is also at slightly higher risk for ectopic pregnancy and she should take a pregnancy test if she believes she may be pregnant. She was advised to use backup method of protection for one week. She verbalized understanding of all of the above and consent signed.   Last intercourse was prior to delivery  Last pap smear was on 01/01/2020 and was NILM,  collected today UPT today: Negative  IUD Insertion  Patient identified and an adequate time out was performed. Speculum placed in the vagina. The cervix was cleaned with Betadine x 2 and grasped anteriorly with a single tooth tenaculum.  A uterine sound was used to sound the uterus to 8 cm;  the IUD was then placed per manufacturer's recommendations. Strings trimmed to 3 cm. Tenaculum was removed, good hemostasis noted. Patient  tolerated procedure well.   Patient was given post-procedure instructions.  She was reminded to have backup contraception for one week during this transition period between IUDs.  Patient was also asked to check IUD strings periodically and follow up in 4 weeks for IUD check.  Mirena IUD Exp: 11/2025 Lot: [id number]  5. Encounter for counseling regarding contraception - Discussed options today, patient aware of options and desired IUD as above  6. Unwanted fertility - Patient considering BTL but desires contraception today   7. Screening examination for STI - Cervicovaginal ancillary only - HIV antibody (with reflex) - RPR - Hepatitis B Surface AntiGEN - Hepatitis C Antibody   Camie DELENA Rote, CNM [facility] [employer]

## 2023-11-17 ENCOUNTER — Ambulatory Visit: Payer: Self-pay | Admitting: Certified Nurse Midwife

## 2023-11-17 DIAGNOSIS — B9689 Other specified bacterial agents as the cause of diseases classified elsewhere: Secondary | ICD-10-CM

## 2023-11-17 LAB — CERVICOVAGINAL ANCILLARY ONLY
Bacterial Vaginitis (gardnerella): POSITIVE — AB
Candida Glabrata: NEGATIVE
Candida Vaginitis: NEGATIVE
Chlamydia: NEGATIVE
Comment: NEGATIVE
Comment: NEGATIVE
Comment: NEGATIVE
Comment: NEGATIVE
Comment: NEGATIVE
Comment: NORMAL
Neisseria Gonorrhea: NEGATIVE
Trichomonas: NEGATIVE

## 2023-11-17 LAB — RPR: RPR Ser Ql: NONREACTIVE

## 2023-11-17 LAB — HEPATITIS C ANTIBODY: Hep C Virus Ab: NONREACTIVE

## 2023-11-17 LAB — HIV ANTIBODY (ROUTINE TESTING W REFLEX): HIV Screen 4th Generation wRfx: NONREACTIVE

## 2023-11-17 LAB — HEPATITIS B SURFACE ANTIGEN: Hepatitis B Surface Ag: NEGATIVE

## 2023-11-17 MED ORDER — METRONIDAZOLE 0.75 % VA GEL
1.0000 | Freq: Every day | VAGINAL | 1 refills | Status: AC
Start: 2023-11-17 — End: ?

## 2023-11-18 LAB — CYTOLOGY - PAP
Comment: NEGATIVE
Diagnosis: NEGATIVE
High risk HPV: NEGATIVE

## 2023-12-22 NOTE — Progress Notes (Unsigned)
° °  GYNECOLOGY PROGRESS NOTE  History:  38 y.o. G3P3003 presents to [facility] *** office today for problem gyn visit. She reports *****.  She denies h/a, dizziness, shortness of breath, n/v, or fever/chills.    The following portions of the patient's history were reviewed and updated as appropriate: allergies, current medications, past family history, past medical history, past social history, past surgical history and problem list. Last pap smear on *** was normal, *** HRHPV.  Health Maintenance Due  Topic Date Due   HPV VACCINES (2 - 3-dose series) 08/02/2006   COVID-19 Vaccine (3 - 2025-26 season) 09/13/2023     Review of Systems:  Pertinent items are noted in HPI.   Objective:  Physical Exam Last menstrual period 01/27/2023, currently breastfeeding. VS reviewed, nursing note reviewed,  Constitutional: well developed, well nourished, no distress HEENT: normocephalic CV: normal rate Pulm/chest wall: normal effort Breast Exam: deferred Abdomen: soft Neuro: alert and oriented x 3 Skin: warm, dry Psych: affect normal Pelvic exam: Cervix pink, visually closed, without lesion, scant white creamy discharge, vaginal walls and external genitalia normal Bimanual exam: Cervix 0/long/high, firm, anterior, neg CMT, uterus nontender, nonenlarged, adnexa without tenderness, enlargement, or mass  Assessment & Plan:  1. IUD check up (Primary) ***   No follow-ups on file.   Camie DELENA Rote, CNM 3:59 PM

## 2023-12-23 ENCOUNTER — Ambulatory Visit: Payer: Self-pay | Admitting: Certified Nurse Midwife

## 2024-03-07 ENCOUNTER — Ambulatory Visit: Payer: Self-pay | Admitting: Hematology and Oncology

## 2024-03-07 ENCOUNTER — Other Ambulatory Visit: Payer: Self-pay
